# Patient Record
Sex: Male | Born: 1970 | Race: White | Hispanic: No | Marital: Married | Smoking: Never smoker
Health system: Southern US, Community
[De-identification: ages and names within clinical notes are randomized; demographics above are authoritative.]

## PROBLEM LIST (undated history)

## (undated) DIAGNOSIS — I509 Heart failure, unspecified: Secondary | ICD-10-CM

## (undated) DIAGNOSIS — B353 Tinea pedis: Secondary | ICD-10-CM

## (undated) DIAGNOSIS — I428 Other cardiomyopathies: Secondary | ICD-10-CM

## (undated) DIAGNOSIS — I10 Essential (primary) hypertension: Secondary | ICD-10-CM

## (undated) HISTORY — DX: Heart failure, unspecified: I50.9

## (undated) HISTORY — DX: Tinea pedis: B35.3

## (undated) HISTORY — DX: Essential (primary) hypertension: I10

---

## 2004-04-18 ENCOUNTER — Ambulatory Visit: Payer: Self-pay | Admitting: Family Medicine

## 2006-02-05 ENCOUNTER — Ambulatory Visit: Payer: Self-pay | Admitting: Family Medicine

## 2006-02-22 ENCOUNTER — Ambulatory Visit: Payer: Self-pay | Admitting: Pulmonary Disease

## 2006-07-30 ENCOUNTER — Ambulatory Visit: Payer: Self-pay | Admitting: Family Medicine

## 2006-07-30 ENCOUNTER — Encounter: Payer: Self-pay | Admitting: Family Medicine

## 2006-07-30 DIAGNOSIS — G4733 Obstructive sleep apnea (adult) (pediatric): Secondary | ICD-10-CM | POA: Insufficient documentation

## 2007-03-15 ENCOUNTER — Emergency Department (HOSPITAL_COMMUNITY): Admission: EM | Admit: 2007-03-15 | Discharge: 2007-03-15 | Payer: Self-pay | Admitting: Family Medicine

## 2008-09-07 ENCOUNTER — Telehealth: Payer: Self-pay | Admitting: Family Medicine

## 2008-09-08 ENCOUNTER — Ambulatory Visit: Payer: Self-pay | Admitting: Family Medicine

## 2008-09-28 ENCOUNTER — Telehealth: Payer: Self-pay | Admitting: Family Medicine

## 2008-10-02 ENCOUNTER — Ambulatory Visit: Payer: Self-pay | Admitting: Family Medicine

## 2008-10-06 LAB — CONVERTED CEMR LAB
ALT: 38 units/L (ref 0–53)
AST: 25 units/L (ref 0–37)
Alkaline Phosphatase: 71 units/L (ref 39–117)
Chloride: 107 meq/L (ref 96–112)
Direct LDL: 58.2 mg/dL
Eosinophils Relative: 2.1 % (ref 0.0–5.0)
HCT: 45.8 % (ref 39.0–52.0)
Hemoglobin: 16.2 g/dL (ref 13.0–17.0)
Lymphs Abs: 2.6 10*3/uL (ref 0.7–4.0)
Monocytes Relative: 5.8 % (ref 3.0–12.0)
Neutro Abs: 4.7 10*3/uL (ref 1.4–7.7)
Phosphorus: 3 mg/dL (ref 2.3–4.6)
Potassium: 3.9 meq/L (ref 3.5–5.1)
RBC: 5.07 M/uL (ref 4.22–5.81)
Sodium: 140 meq/L (ref 135–145)
TSH: 2.17 microintl units/mL (ref 0.35–5.50)
Total Bilirubin: 0.9 mg/dL (ref 0.3–1.2)
Total CHOL/HDL Ratio: 7
VLDL: 99.4 mg/dL — ABNORMAL HIGH (ref 0.0–40.0)
WBC: 8 10*3/uL (ref 4.5–10.5)

## 2009-04-06 ENCOUNTER — Ambulatory Visit: Payer: Self-pay | Admitting: Family Medicine

## 2009-04-06 DIAGNOSIS — E785 Hyperlipidemia, unspecified: Secondary | ICD-10-CM | POA: Insufficient documentation

## 2009-04-08 LAB — CONVERTED CEMR LAB
ALT: 44 units/L (ref 0–53)
AST: 31 units/L (ref 0–37)
Direct LDL: 94 mg/dL
VLDL: 53.4 mg/dL — ABNORMAL HIGH (ref 0.0–40.0)

## 2009-09-27 ENCOUNTER — Ambulatory Visit: Payer: Self-pay | Admitting: Family Medicine

## 2009-09-27 DIAGNOSIS — B353 Tinea pedis: Secondary | ICD-10-CM | POA: Insufficient documentation

## 2010-04-19 NOTE — Assessment & Plan Note (Signed)
Summary: 6 M F/U DLO   Vital Signs:  Patient profile:   40 year old male Weight:      230 pounds Temp:     97.7 degrees F oral Pulse rate:   80 / minute Pulse rhythm:   regular BP sitting:   122 / 82  (left arm) Cuff size:   large  Vitals Entered By: Lowella Petties CMA (April 06, 2009 9:17 AM) CC: 6 month follow up   History of Present Illness: here for f/u of elevated bp and cholesterol is feeling good overall   is working on lifestyle changes    last visit bp down with good habits  today is good as well 122/82  cholesterol high when we checked it this summer- trig trig 497, HDL 24 and LDL 58 he did not get phone message about this -- got it late  diet is much better -- with less fat  less drinks and alcohol  is taking fish oil -- 4 pills per day - omega 3 and 6 , and treadmill 2-3 miles per day also has a very physical job , runs and walk   is eating very little to no fried food  does not eat beef or egg yolks as a role  has cut back on breakfast meats   wt is up 2 lb     Allergies: No Known Drug Allergies  Past History:  Past Medical History: Last updated: 10/02/2008 elevated blood pressure   Family History: Last updated: 10/02/2008 Father:  Mother: HTN , mild DM Siblings: 2 brothers GM CVA, HTN, DM  Social History: Last updated: 10/02/2008 Marital Status: Married Children: 0 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 Eyes:  Denies blurring and eye pain. CV:  Denies chest pain or discomfort, lightheadness, palpitations, and shortness of breath with exertion. Resp:  Denies cough and wheezing. GI:  Denies abdominal pain, change in bowel habits, and  indigestion. GU:  Denies urinary frequency. MS:  Denies joint pain and muscle aches. Derm:  Denies lesion(s), poor wound healing, and rash. Neuro:  Denies headaches, numbness, and tingling. Psych:  Denies anxiety and depression. Endo:  Denies cold intolerance, excessive thirst, excessive urination, and heat intolerance.  Physical Exam  General:  overweight but generally well appearing  Head:  normocephalic, atraumatic, and no abnormalities observed.   Eyes:  vision grossly intact, pupils equal, pupils round, and pupils reactive to light.   Neck:  supple with full rom and no masses or thyromegally, no JVD or carotid bruit  Lungs:  normal respiratory effort, no intercostal retractions, no accessory muscle use, and normal breath sounds.   Heart:  Normal rate and regular rhythm. S1 and S2 normal without gallop, murmur, click, rub or other extra sounds. Msk:  No deformity or scoliosis noted of thoracic or lumbar spine.  no acute joint changes  Extremities:  No clubbing, cyanosis, edema, or deformity noted with normal full range of motion of all joints.   Neurologic:  sensation intact to light touch, gait normal, and DTRs symmetrical and normal.   Skin:  Intact without suspicious lesions or rashes Cervical Nodes:  No lymphadenopathy noted Psych:  normal affect, talkative and pleasant    Impression & Recommendations:  Problem # 1:  ELEVATED BLOOD PRESSURE WITHOUT DIAGNOSIS OF HYPERTENSION (ICD-796.2) Assessment Improved  now normal with healthy diet and exercise  will continue to follow  commended on good job  BP today: 122/82 Prior BP: 136/80 (10/02/2008)  Labs Reviewed: Creat: 1.0 (10/02/2008) Chol: 162 (10/02/2008)   HDL: 24.30 (10/02/2008)   TG: 497.0 (10/02/2008)  Instructed in low sodium diet (DASH Handout) and behavior modification.    Problem # 2:  HYPERLIPIDEMIA (ICD-272.4) Assessment: New  disc risks of high trig and low hdl  commended on good diet and exercise enc  to continue omega 3 rev low sat fat diet  lab today and adv Orders: Venipuncture (09811) TLB-Lipid Panel (80061-LIPID) TLB-ALT (SGPT) (84460-ALT) TLB-AST (SGOT) (84450-SGOT)  Labs Reviewed: SGOT: 25 (10/02/2008)   SGPT: 38 (10/02/2008)   HDL:24.30 (10/02/2008)  Chol:162 (10/02/2008)  Trig:497.0 (10/02/2008)  Complete Medication List: 1)  Elocon 0.1 % Lotn (Mometasone furoate) .... Use sparringly to rash two times a day as needed 2)  Fish Oil  .... 2 pills two times a day  Patient Instructions: 1)  you can raise your HDL (good cholesterol) by increasing exercise and eating omega 3 fatty acid supplement like fish oil or flax seed oil over the counter 2)  you can lower LDL (bad cholesterol) by limiting saturated fats in diet like red meat, fried foods, egg yolks, fatty breakfast meats, high fat dairy products and shellfish 3)  keep up better diet and exercise 4)  will adv you when labs return   Prior Medications (reviewed today): ELOCON 0.1 % LOTN (MOMETASONE FUROATE) use sparringly to rash two times a day as needed Current Allergies: No known allergies

## 2010-04-19 NOTE — Assessment & Plan Note (Signed)
Summary: ATHLETES FEET / LFW   Vital Signs:  Patient profile:   40 year old male Height:      69.75 inches Weight:      226.50 pounds BMI:     32.85 Temp:     98.5 degrees F oral Pulse rate:   76 / minute Pulse rhythm:   regular BP sitting:   128 / 92  (left arm) Cuff size:   large  Vitals Entered By: Lewanda Rife LPN (September 27, 2009 4:27 PM) CC: Athletes foot on left foot   History of Present Illness: has athletes foot - hurts and itches - tries not to scratch peels and dry  has had it 3-4 months  ? 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 Eyes:  Denies blurring and discharge. CV:  Denies chest pain or discomfort. Resp:  Denies cough and wheezing. MS:  Denies joint pain. Derm:  Complains of itching, poor wound healing, and rash. Neuro:  Denies numbness and tingling. Endo:  Denies excessive thirst and excessive urination. Heme:  Denies abnormal bruising and bleeding.  Physical Exam  General:  overweight but generally well appearing  Head:  normocephalic, atraumatic, and no abnormalities observed.     Eyes:  vision grossly intact, pupils equal, pupils round, and pupils reactive to light.  no conjunctival pallor, injection or icterus  Mouth:  pharynx pink and moist.   Neck:  supple with full rom and no masses or thyromegally, no JVD or carotid bruit  Chest Wall:  No deformities, masses, tenderness or gynecomastia noted. Lungs:  normal respiratory effort, no intercostal retractions, no accessory muscle use, and normal breath sounds.   Heart:  Normal rate and regular rhythm. S1 and S2 normal without gallop, murmur, click, rub or other extra sounds. Skin:  L foot- redness/ scale/peeling of 2nd and 3rd toes with maceration in between  some excoriation no vesicles  no nail involvement Psych:  normal affect, talkative and pleasant    Impression & Recommendations:  Problem # 1:  DERMATOPHYTOSIS OF FOOT (ICD-110.4) Assessment New  L foot - with peeling and cracking and discomfort disc disinfecting shower/ shoes  trial of naftin once daily cream (will treat both feet) if not imp - will update / may need to investigate poss of eczema type rxn The following medications were removed from the medication list:    Lamisil At Athletes Foot 1 % Crea (Terbinafine hcl) ..... Otc as directed.    Micatin 2 % Aero (Miconazole nitrate) ..... Otc as directed. His updated medication list for this problem includes:    Tinactin 1 % Aero (Tolnaftate) .Marland KitchenMarland KitchenMarland KitchenMarland Kitchen  Otc as directed.    Naftin 1 % Crea (Naftifine hcl) .Marland Kitchen... Apply to both feet once daily for 4 weeks  Orders: Prescription Created Electronically [phone])  Complete Medication List: 1)  Elocon 0.1 % Lotn (Mometasone furoate) .... Use sparringly to rash two times a day as needed 2)  Fish Oil  .... 2 pills two times a day 3)  Tinactin 1 % Aero (Tolnaftate) .... Otc as directed. 4)  Naftin 1 % Crea (Naftifine hcl) .... Apply to both feet once daily for 4 weeks  Patient Instructions: 1)  use lysol on shower surfaces  2)  use antifungal foot spray over  the counter in shoes - like tinactin every day that you wear them  3)  keep feet as clean and dry as possible- even use a hairdryer on cool setting to dry after bathing  4)  use the naftin cream as directed  5)  if not improved in 2 weeks - please call and let me know  Prescriptions: NAFTIN 1 % CREA (NAFTIFINE HCL) apply to both feet once daily for 4 weeks  #1 medium x 1   Entered and Authorized by:   Judith Part MD   Signed by:   Judith Part MD on 09/27/2009   Method used:   Electronically to        [facility] (retail)       [address]       [place], [place]  [address]       Ph: [phone] or [phone]       Fax: [phone]   RxID:   [id number]   Current Allergies (reviewed today): No known allergies

## 2010-05-17 ENCOUNTER — Encounter: Payer: Self-pay | Admitting: Family Medicine

## 2010-05-24 ENCOUNTER — Encounter: Payer: Self-pay | Admitting: Family Medicine

## 2010-06-10 ENCOUNTER — Ambulatory Visit (INDEPENDENT_AMBULATORY_CARE_PROVIDER_SITE_OTHER): Payer: BC Managed Care – PPO | Admitting: Family Medicine

## 2010-06-10 ENCOUNTER — Encounter: Payer: Self-pay | Admitting: Family Medicine

## 2010-06-10 DIAGNOSIS — Z0000 Encounter for general adult medical examination without abnormal findings: Secondary | ICD-10-CM

## 2010-06-10 DIAGNOSIS — B353 Tinea pedis: Secondary | ICD-10-CM

## 2010-06-10 DIAGNOSIS — E785 Hyperlipidemia, unspecified: Secondary | ICD-10-CM

## 2010-06-10 LAB — HEPATIC FUNCTION PANEL
ALT: 31 U/L (ref 0–53)
Bilirubin, Direct: 0.1 mg/dL (ref 0.0–0.3)
Indirect Bilirubin: 0.7 mg/dL (ref 0.0–0.9)
Total Bilirubin: 0.8 mg/dL (ref 0.3–1.2)

## 2010-06-10 LAB — LIPID PANEL
Cholesterol: 158 mg/dL (ref 0–200)
HDL: 24 mg/dL — ABNORMAL LOW (ref >39)
LDL Cholesterol: 75 mg/dL (ref 0–99)
Total CHOL/HDL Ratio: 6.6 Ratio
Triglycerides: 293 mg/dL — ABNORMAL HIGH (ref <150)
VLDL: 59 mg/dL — ABNORMAL HIGH (ref 0–40)

## 2010-06-10 LAB — BASIC METABOLIC PANEL
Chloride: 104 mEq/L (ref 96–112)
Creat: 1.03 mg/dL (ref 0.40–1.50)

## 2010-06-10 NOTE — Assessment & Plan Note (Signed)
In the past high trig  Pt has been working on diet Lab today - non fasting in light of schedule - but if high - will repeat fasting Reviewed low sat fat diet in detail today

## 2010-06-10 NOTE — Assessment & Plan Note (Signed)
Rev medical/ fam and soc hx - risk factors Diet and exercise habits discussed as well as low sat fat diet  Disc skin cancer prev Wellness labs today imms up to date

## 2010-06-10 NOTE — Patient Instructions (Signed)
Keep working on [employer] and exercise  Use sunscreen regularly  Labs today for wellness and cholesterol

## 2010-06-10 NOTE — Progress Notes (Signed)
Subjective:    Patient ID: [name], male    DOB: 1971-02-07, 40 y.o.   MRN: [id number]  HPI Here for health mt exam and to review chronic med problems   Is doing well  Is feeling good  No medical problems   Very busy with work -- Oceanographer   Hx of lipids with high trig in past - due for a check Last LDL 94 which is good   Wt is up 3 lb bmi is 33 He is trying to take care of himself  Uses the treadmill 4 times per week -- 2.25 mild and runs for 1/2 of that  occ weight training  Very active job   Diet is so/so  Eats healthy for the most part -- mostly salads  Has cut back on fast food  Cooks at home a lot more  Gave up soft drinks Drinks a lot of water  1 cup of coffee with skim milk  And no sugar   Non smoker No chewing tab   Td 7/10   Does have HTN and DM in family   bp is good today   Treats foot fungus just as needed  Keeps feet dry    Past Medical History  Diagnosis Date  . Hypertension   . Athlete's foot    No past surgical history on file.   History   Social History  . Marital Status: Married    Spouse Name: N/A    Number of Children: 0  . Years of Education: N/A   Occupational History  . Telephone    Social History Main Topics  . Smoking status: Never Smoker   . Smokeless tobacco: Not on file  . Alcohol Use: Yes  . Drug Use:   . Sexually Active:    Other Topics Concern  . Not on file   Social History Narrative  . No narrative on file   family history includes Diabetes in his maternal grandmother and mother; Heart disease in his maternal grandmother; and Hypertension in his maternal grandmother and mother.      Review of Systems  Constitutional: Negative for appetite change and fatigue.  HENT: Negative for congestion.   Eyes: Negative for visual disturbance.  Respiratory: Negative.   Cardiovascular: Negative.   Gastrointestinal: Negative for abdominal pain, diarrhea and constipation.  Genitourinary:  Negative for frequency.  Musculoskeletal: Negative for arthralgias.  Skin: Positive for rash. Negative for wound.  Neurological: Negative for numbness and headaches.  Hematological: Does not bruise/bleed easily.  Psychiatric/Behavioral: Negative for dysphoric mood. The patient is not nervous/anxious.        Objective:   Physical Exam  Constitutional: He appears well-developed and well-nourished. No distress.       overwt and well appearing   HENT:  Head: Normocephalic and atraumatic.  Right Ear: External ear normal.  Left Ear: External ear normal.  Nose: Nose normal.  Eyes: Conjunctivae and EOM are normal. Pupils are equal, round, and reactive to light.  Neck: Normal range of motion. Neck supple. No JVD present. Carotid bruit is not present. No thyromegaly present.  Cardiovascular: Normal rate, regular rhythm and normal heart sounds.   Pulmonary/Chest: Effort normal and breath sounds normal.  Abdominal: Soft. He exhibits no mass. There is no tenderness.  Musculoskeletal: Normal range of motion. He exhibits no edema.  Lymphadenopathy:    He has no cervical adenopathy.  Neurological: He has normal reflexes. Coordination normal.  Skin: Skin is warm and dry.  Some redness in between 2nd and 3rd toes on L foot - no skin breakdown  Scant scaling on heels  Psychiatric: He has a normal mood and affect.          Assessment & Plan:

## 2010-06-10 NOTE — Assessment & Plan Note (Signed)
Overall improved  Mostly in between L 2nd and 3rd toes Keeps dry- meds prn

## 2010-06-11 LAB — CBC WITH DIFFERENTIAL/PLATELET
Basophils Absolute: 0 10*3/uL (ref 0.0–0.1)
Basophils Relative: 0 % (ref 0–1)
Eosinophils Absolute: 0.1 10*3/uL (ref 0.0–0.7)
Hemoglobin: 15.1 g/dL (ref 13.0–17.0)
MCH: 30.4 pg (ref 26.0–34.0)
MCHC: 34.5 g/dL (ref 30.0–36.0)
Neutro Abs: 5.2 10*3/uL (ref 1.7–7.7)
Neutrophils Relative %: 59 % (ref 43–77)
Platelets: 238 10*3/uL (ref 150–400)
RDW: 12.2 % (ref 11.5–15.5)

## 2010-06-11 LAB — TSH: TSH: 3.621 u[IU]/mL (ref 0.350–4.500)

## 2010-06-13 NOTE — Progress Notes (Signed)
Patient notified as instructed by telephone. Pt will call back to scheduled fasting lab appt.

## 2010-08-05 NOTE — Assessment & Plan Note (Signed)
[facility]                             PULMONARY OFFICE NOTE   [name], [name]                     MRN:          [id number]  DATE:02/22/2006                            DOB:          [birth year]    I had the pleasure of meeting [name] today for evaluation of sleep  difficulties.   He says that his wife has told him that he snores particularly when he  is on his back and has had a couple of instances in which he has woken  up with a gasping feeling. He will also tend to breathe through his  mouth at night. He usually goes to sleep between 9 and midnight, but it  is usually closer to midnight. He has no difficulty falling asleep. He  wakes up perhaps once a night to use the bathroom between 3 and 5  o'clock in the morning and then is able to go back to sleep and he will  wake up at 7 in the morning to get ready to go to the work. He says that  he sleeps in until 8 or 9 o'clock in the morning on weekends however. He  says that sometimes it can be a little bit of a struggle for him when he  is getting up in the morning, but he denies having any headaches in the  morning. He will drink 1 cup of coffee and 2 to 3 sodas a day. He says  that he has no trouble as far as staying awake while he is driving, but  if he is at home in the evening watching TV, he will tend to dose off.  He denies any history of sleep walking or sleep talking, nightmares or  night terrors. There is no history of restless leg syndrome. He denies  any history of sleep hallucinations, sleep paralysis or cataplexy. He is  not currently using anything to help him fall asleep at night or stay  awake during the day otherwise.   PAST MEDICAL HISTORY:  Is significant for seasonal allergies.   He is not currently on any medications.   There are no known drug allergies.   SOCIAL HISTORY:  He is married. He has no children. 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 He denies any cigarette use. He does use smokeless  tobacco. He has occasional alcohol use.   FAMILY HISTORY:  Is significant for his grandfather with emphysema.   REVIEW OF SYSTEMS:  He complains of nasal congestion and rhinitis with  the change of the seasons.   PHYSICAL EXAMINATION:  He is 6 feet tall and 226 pounds, temperature is  98.6, blood pressure is 112/80, heart rate is 102. Oxygen saturation is  97% on room air.  HEENT: Pupils are reactive. Extraocular muscles are intact. There is no  sinus tenderness. There is no nasal discharge. He has a Mallampati 3  airway with scalloped borders of his tongue and 3+ tonsils with an  elongated uvula. There was no lymphadenopathy. No thyromegaly.  HEART: Was S1, S2, regular rhythm.  CHEST: Was clear to auscultation.  ABDOMEN: Obese, soft and nontender.  EXTREMITIES: There was no edema, cyanosis or clubbing.  NEUROLOGIC: No focal deficits were appreciated.   His Epworth score was 3/24.   IMPRESSION:  He certainly has symptoms as well as physical finding which  would be suggestive of possibly having sleep disorder breathing. I think  that to further evaluate this he will need to undergo an overnight  polysomnogram and if in fact he does have sleep apnea, then further  interventions would be discussed at that time. In the meantime, I have  described to him the diagnosis of sleep apnea including the adverse  health consequences related to sleep apnea. These include increased risk  of hypertension, coronary artery disease, cerebral vascular disease and  diabetes. I also discussed with him the importance of diet, exercise and  weight reduction as well as the avoidance of alcohol and sedatives.  Driving precautions were discussed with him as well.   I will follow up with him in approximately 3 to 4 weeks to review the  results of his sleep study.     Troy Helling, MD   Electronically Signed    VS/MedQ  DD: 02/22/2006  DT: 02/22/2006  Job #: [id number]   cc:   Troy A. Milinda Antis, MD

## 2010-08-05 NOTE — Assessment & Plan Note (Signed)
[facility]                             PULMONARY OFFICE NOTE   NAME:[name], [name]                     MRN:          [id number]  DATE:03/30/2006                            DOB:          Jul 15, 1970    Mr. [name] was scheduled to have his overnight polysomnogram done  March 23, 2006.  Unfortunately, he was out of town during that time  period and had to cancel his appointment.  I have discussed this with  Mr. [name], and he has informed me that he would need to consult his  schedule further before rescheduling for his overnight polysomnogram,  but that he would call the Sleep Lab to reschedule this when it is  convenient for him.  Therefore, I will defer followup with him until he  has been able to schedule his overnight polysomnogram.     Coralyn Helling, MD  Electronically Signed    VS/MedQ  DD: 03/30/2006  DT: 03/30/2006  Job #: [id number]   cc:   Marne A. Milinda Antis, MD

## 2010-09-27 ENCOUNTER — Telehealth: Payer: Self-pay | Admitting: *Deleted

## 2010-09-27 MED ORDER — TERBINAFINE HCL 250 MG PO TABS: 250.0000 mg | ORAL_TABLET | Freq: Every day | ORAL | Status: AC

## 2010-09-27 NOTE — Telephone Encounter (Signed)
Patients wife advised and he will start medication when home from beach to avoid alcohol. Patient will also schedule follow up when medication finished

## 2010-09-27 NOTE — Telephone Encounter (Signed)
We can try lamasil 250mg  daily for 2 weeks  Then follow up for a re check  This is unsafe with excessive alcohol or liver problems (I looked at his last liver tests- these were fine) Px sent electronically  sched f/u for about 2-3 weeks when done with med -thanks

## 2010-09-27 NOTE — Telephone Encounter (Signed)
Pt is having a lot of trouble with athlete's foot, having to wear work boots in this hot weather.  He has tried several otc meds and is finishing up with naftin, which his dermatologist recommended.  Nothing helps and his wife is asking if there is an oral medicine that he can try.  [facility] [address]

## 2013-05-23 ENCOUNTER — Encounter: Payer: Self-pay | Admitting: Family Medicine

## 2013-05-23 ENCOUNTER — Ambulatory Visit: Payer: BC Managed Care – PPO | Admitting: Family Medicine

## 2013-05-23 VITALS — BP 108/70 | HR 119 | Temp 97.5°F | Ht 69.75 in | Wt 229.5 lb

## 2013-05-23 DIAGNOSIS — F411 Generalized anxiety disorder: Secondary | ICD-10-CM

## 2013-05-23 DIAGNOSIS — R0789 Other chest pain: Secondary | ICD-10-CM | POA: Insufficient documentation

## 2013-05-23 DIAGNOSIS — R5383 Other fatigue: Secondary | ICD-10-CM | POA: Insufficient documentation

## 2013-05-23 DIAGNOSIS — K219 Gastro-esophageal reflux disease without esophagitis: Secondary | ICD-10-CM

## 2013-05-23 DIAGNOSIS — G4730 Sleep apnea, unspecified: Secondary | ICD-10-CM

## 2013-05-23 MED ORDER — OMEPRAZOLE 20 MG PO CPDR: 20.0000 mg | DELAYED_RELEASE_CAPSULE | Freq: Every day | ORAL | Status: DC

## 2013-05-23 NOTE — Progress Notes (Signed)
Pre visit review using our clinic review tool, if applicable. No additional management support is needed unless otherwise documented below in the visit note. 

## 2013-05-23 NOTE — Progress Notes (Signed)
Subjective:    Patient ID: Troy NettersJonathan Marietta, male    DOB: [birth year], 43 y.o.   MRN: [id number]  HPI Here with ? Of poss sleep apnea   ? Anxiety also  Has his first child coming in July -and is admittedly nervous about it   Is fatigued  Is fatigued more easily than usual  Unable to sleep through the night  Sleeps short periods and wakes up gasping for breath  Snores lately - uses nasal strips  Per wife-night breathing is shallow  Discomfort and a bloated feeling in his upper abd and chest  Worse with exertion Feels sob briefly  If he stops what he is doing-it goes away  No sweats or nausea with this   No fam hx of heart problems except mother with HTN - causing new CHF   BP Readings from Last 3 Encounters:  05/23/13 108/70  06/10/10 126/84  09/27/09 128/92      More constipated than usual   If he drinks beer (which is infrequent) - he gets a bubble sensation in upper abd as well  Also gets heartburn  Patient Active Problem List   Diagnosis Date Noted  . Chest discomfort 05/23/2013  . Fatigue 05/23/2013  . Anxiety state, unspecified 05/23/2013  . Acid reflux 05/23/2013  . Health maintenance examination 06/10/2010  . DERMATOPHYTOSIS OF FOOT 09/27/2009  . HYPERLIPIDEMIA 04/06/2009  . ELEVATED BLOOD PRESSURE WITHOUT DIAGNOSIS OF HYPERTENSION 09/08/2008  . SLEEP APNEA 07/30/2006   Past Medical History  Diagnosis Date  . Hypertension   . Athlete's foot    No past surgical history on file. History  Substance Use Topics  . Smoking status: Never Smoker   . Smokeless tobacco: Not on file  . Alcohol Use: Yes     Comment: rare   Family History  Problem Relation Age of Onset  . Hypertension Mother   . Diabetes Mother   . Hypertension Maternal Grandmother   . Diabetes Maternal Grandmother   . Heart disease Maternal Grandmother    No Known Allergies Current Outpatient Prescriptions on File Prior to Visit  Medication Sig Dispense Refill  . fish oil-omega-3  fatty acids 1000 MG capsule Take 1 g by mouth 2 (two) times daily.        No current facility-administered medications on file prior to visit.    Review of Systems Review of Systems  Constitutional: Negative for fever, appetite change,  and unexpected weight change.  Eyes: Negative for pain and visual disturbance.  Respiratory: Negative for cough and shortness of breath.   Cardiovascular: Negative for palpitations  Neg for pedal edema   Gastrointestinal: Negative for nausea, diarrhea pos for heartburn and bloating Genitourinary: Negative for urgency and frequency.  Skin: Negative for pallor or rash   Neurological: Negative for weakness, light-headedness, numbness and headaches.  Hematological: Negative for adenopathy. Does not bruise/bleed easily.  Psychiatric/Behavioral: Negative for dysphoric mood. The patient is not nervous/anxious.         Objective:   Physical Exam  Constitutional: He appears well-developed and well-nourished. No distress.  obese and well appearing   HENT:  Head: Normocephalic and atraumatic.  Mouth/Throat: Oropharynx is clear and moist.  Eyes: Conjunctivae and EOM are normal. Pupils are equal, round, and reactive to light. Right eye exhibits no discharge. Left eye exhibits no discharge. No scleral icterus.  Neck: Normal range of motion. Neck supple. No JVD present. Carotid bruit is not present. No thyromegaly present.  Cardiovascular: Normal rate, regular rhythm, normal  heart sounds and intact distal pulses.  Exam reveals no gallop.   No murmur heard. Pulmonary/Chest: Effort normal and breath sounds normal. No respiratory distress. He has no wheezes. He has no rales.  Abdominal: Soft. Bowel sounds are normal. He exhibits no distension, no abdominal bruit and no mass. There is tenderness. There is no rebound and no guarding.  Mild epigastric tenderness without rebound or guarding   Musculoskeletal: He exhibits no edema.  Lymphadenopathy:    He has no cervical  adenopathy.  Neurological: He is alert. He has normal reflexes.  Skin: Skin is warm and dry. No rash noted. No erythema. No pallor.  Psychiatric: He has a normal mood and affect.          Assessment & Plan:

## 2013-05-23 NOTE — Patient Instructions (Signed)
Start omeprazole for acid reflux/stomach symptoms  Stop up front for referral to the sleep clinic to get back on track for evaluation and treatment of sleep apnea  Avoid caffeine and carbonation  Avoid spicy foods  If your chest discomfort does not improve with the omeprazole - let me know  Follow up with me in about 2-3 weeks

## 2013-05-25 NOTE — Assessment & Plan Note (Signed)
Ref to pulmonary sleep clinic for eval

## 2013-05-25 NOTE — Assessment & Plan Note (Signed)
susp for sleep apnea  Ref to pulm sleep clinic for eval

## 2013-05-25 NOTE — Assessment & Plan Note (Signed)
Start omeprazole  Disc gerd diet and need for wt loss  Update if not starting to improve in a week or if worsening

## 2013-05-25 NOTE — Assessment & Plan Note (Signed)
Sinus tach on EKG-pt was admittedly nervous Poor R wave progression - no hx of pulm dz but  ? Of poss sleep apnea  Atypical  If symptoms progress or worsen -will update and seek care

## 2013-05-25 NOTE — Assessment & Plan Note (Signed)
?   If contrib to his current symptoms - with new baby coming in the summer Disc relaxation tech/ exercise  Will f/u to disc further if no improvement

## 2013-05-28 ENCOUNTER — Telehealth: Payer: Self-pay

## 2013-05-28 NOTE — Telephone Encounter (Signed)
Try nexium over the counter one daily- let me know early next week if this helps at all

## 2013-05-28 NOTE — Telephone Encounter (Signed)
Left message for patient to try Nexium OTC one daily and to call us early next week to let Dr. Milinda Antis know if it helps at all.

## 2013-05-28 NOTE — Telephone Encounter (Signed)
Pt left v/m that omeprazole is not effective and pt request different med for acid reflux or what to do.Please advise.[facility]

## 2013-05-29 NOTE — Telephone Encounter (Addendum)
Pt left v/m; pt got message but pt said still very bloated and wants to know what to do to get rid of the bloated feeling.pt request cb.

## 2013-05-29 NOTE — Telephone Encounter (Signed)
He can try some gas-x over the counter for that also and see if it helps

## 2013-05-30 ENCOUNTER — Encounter: Payer: Self-pay | Admitting: Internal Medicine

## 2013-05-30 ENCOUNTER — Ambulatory Visit (HOSPITAL_COMMUNITY)
Admission: RE | Admit: 2013-05-30 | Discharge: 2013-05-30 | Disposition: A | Discharge status: Home / Self Care | Payer: BC Managed Care – PPO | Source: Ambulatory Visit | Attending: Internal Medicine | Admitting: Internal Medicine

## 2013-05-30 ENCOUNTER — Other Ambulatory Visit: Payer: Self-pay | Admitting: Internal Medicine

## 2013-05-30 ENCOUNTER — Ambulatory Visit (INDEPENDENT_AMBULATORY_CARE_PROVIDER_SITE_OTHER)
Admission: RE | Admit: 2013-05-30 | Discharge: 2013-05-30 | Disposition: A | Discharge status: Home / Self Care | Payer: BC Managed Care – PPO | Source: Ambulatory Visit | Attending: Internal Medicine | Admitting: Internal Medicine

## 2013-05-30 ENCOUNTER — Telehealth: Payer: Self-pay

## 2013-05-30 ENCOUNTER — Ambulatory Visit (INDEPENDENT_AMBULATORY_CARE_PROVIDER_SITE_OTHER): Payer: BC Managed Care – PPO | Admitting: Internal Medicine

## 2013-05-30 ENCOUNTER — Other Ambulatory Visit: Payer: BC Managed Care – PPO

## 2013-05-30 VITALS — BP 118/76 | HR 110 | Temp 98.1°F | Wt 230.0 lb

## 2013-05-30 DIAGNOSIS — R140 Abdominal distension (gaseous): Secondary | ICD-10-CM

## 2013-05-30 DIAGNOSIS — R142 Eructation: Secondary | ICD-10-CM

## 2013-05-30 DIAGNOSIS — R112 Nausea with vomiting, unspecified: Secondary | ICD-10-CM

## 2013-05-30 DIAGNOSIS — R188 Other ascites: Secondary | ICD-10-CM | POA: Insufficient documentation

## 2013-05-30 DIAGNOSIS — R109 Unspecified abdominal pain: Secondary | ICD-10-CM

## 2013-05-30 DIAGNOSIS — R1110 Vomiting, unspecified: Secondary | ICD-10-CM | POA: Insufficient documentation

## 2013-05-30 DIAGNOSIS — R143 Flatulence: Secondary | ICD-10-CM

## 2013-05-30 DIAGNOSIS — R17 Unspecified jaundice: Secondary | ICD-10-CM

## 2013-05-30 DIAGNOSIS — R748 Abnormal levels of other serum enzymes: Secondary | ICD-10-CM

## 2013-05-30 DIAGNOSIS — R160 Hepatomegaly, not elsewhere classified: Secondary | ICD-10-CM | POA: Insufficient documentation

## 2013-05-30 DIAGNOSIS — R141 Gas pain: Secondary | ICD-10-CM

## 2013-05-30 DIAGNOSIS — J90 Pleural effusion, not elsewhere classified: Secondary | ICD-10-CM | POA: Insufficient documentation

## 2013-05-30 LAB — COMPREHENSIVE METABOLIC PANEL
ALBUMIN: 3.6 g/dL (ref 3.5–5.2)
ALK PHOS: 67 U/L (ref 39–117)
ALT: 157 U/L — AB (ref 0–53)
AST: 98 U/L — AB (ref 0–37)
BILIRUBIN TOTAL: 2.1 mg/dL — AB (ref 0.3–1.2)
BUN: 18 mg/dL (ref 6–23)
CO2: 24 mEq/L (ref 19–32)
Calcium: 8.9 mg/dL (ref 8.4–10.5)
Chloride: 101 mEq/L (ref 96–112)
Creatinine, Ser: 1.5 mg/dL (ref 0.4–1.5)
GFR: 53.59 mL/min — ABNORMAL LOW (ref >60.00)
GLUCOSE: 110 mg/dL — AB (ref 70–99)
POTASSIUM: 4.6 meq/L (ref 3.5–5.1)
SODIUM: 134 meq/L — AB (ref 135–145)
TOTAL PROTEIN: 5.9 g/dL — AB (ref 6.0–8.3)

## 2013-05-30 LAB — CBC
HEMATOCRIT: 47.6 % (ref 39.0–52.0)
Hemoglobin: 15.6 g/dL (ref 13.0–17.0)
MCHC: 32.8 g/dL (ref 30.0–36.0)
MCV: 91.8 fl (ref 78.0–100.0)
Platelets: 248 10*3/uL (ref 150.0–400.0)
RBC: 5.18 Mil/uL (ref 4.22–5.81)
RDW: 14 % (ref 11.5–14.6)
WBC: 11.8 10*3/uL — ABNORMAL HIGH (ref 4.5–10.5)

## 2013-05-30 LAB — H. PYLORI ANTIBODY, IGG: H PYLORI IGG: NEGATIVE

## 2013-05-30 LAB — LIPASE: Lipase: 44 U/L (ref 11.0–59.0)

## 2013-05-30 MED ORDER — ONDANSETRON HCL 4 MG PO TABS: 4.0000 mg | ORAL_TABLET | Freq: Three times a day (TID) | ORAL | Status: DC | PRN

## 2013-05-30 NOTE — Telephone Encounter (Signed)
That sounds good

## 2013-05-30 NOTE — Telephone Encounter (Signed)
I am not really concerned about the swelling in his ankles at this time. We need to figure out what is going on with his liver/gallbladder first

## 2013-05-30 NOTE — Patient Instructions (Addendum)
Bloating  Bloating is the feeling of fullness in your belly. You may feel as though your pants are too tight. Often the cause of bloating is overeating, retaining fluids, or having gas in your bowel. It is also caused by swallowing air and eating foods that cause gas. Irritable bowel syndrome is one of the most common causes of bloating. Constipation is also a common cause. Sometimes more serious problems can cause bloating.  SYMPTOMS   Usually there is a feeling of fullness, as though your abdomen is bulged out. There may be mild discomfort.   DIAGNOSIS   Usually no particular testing is necessary for most bloating. If the condition persists and seems to become worse, your caregiver may do additional testing.   TREATMENT   · There is no direct treatment for bloating.  · Do not put gas into the bowel. Avoid chewing gum and sucking on candy. These tend to make you swallow air. Swallowing air can also be a nervous habit. Try to avoid this.  · Avoiding high residue diets will help. Eat foods with soluble fibers (examples include root vegetables, apples, or barley) and substitute dairy products with soy and rice products. This helps irritable bowel syndrome.  · If constipation is the cause, then a high residue diet with more fiber will help.  · Avoid carbonated beverages.  · Over-the-counter preparations are available that help reduce gas. Your pharmacist can help you with this.  SEEK MEDICAL CARE IF:   · Bloating continues and seems to be getting worse.  · You notice a weight gain.  · You have a weight loss but the bloating is getting worse.  · You have changes in your bowel habits or develop nausea or vomiting.  SEEK IMMEDIATE MEDICAL CARE IF:   · You develop shortness of breath or swelling in your legs.  · You have an increase in abdominal pain or develop chest pain.  Document Released: 01/04/2006 Document Revised: 05/29/2011 Document Reviewed: 02/22/2007  Exit[employer]® Patient Information ©2014 [employer].

## 2013-05-30 NOTE — Telephone Encounter (Signed)
Before pt left after his labs were drawn, he mentioned to [facility] that you were saying that you were going to send something in for his edema--please advise

## 2013-05-30 NOTE — Telephone Encounter (Signed)
Pt advise to use gas-x but he has an appt with Nicki Reaper, NP today to address his stomach issues

## 2013-05-30 NOTE — Progress Notes (Signed)
Pre visit review using our clinic review tool, if applicable. No additional management support is needed unless otherwise documented below in the visit note. 

## 2013-05-30 NOTE — Progress Notes (Signed)
Subjective:    Patient ID: [name], male    DOB: 1970/10/27, 43 y.o.   MRN: [id number]  HPI  Pt presents to the clinic today with c/o abdominal pain, bloating and vomiting. This started 2-3 weeks ago. He is unable to keep anything down. He also c/o shortness of breath at night and shallow breathing during the day. He does have constipation/diarrhea. He has tried Pepto, Zantac and Imodium OTC. He was seen 3/6 for similar issues. He was started on prilosec at that time for probable GERD. He has not felt like the prilosec has helped. He is scheduled for a sleep study in the next 2 weeks. Additionally, he c/o bilateral ankle swelling. This started this morning. He has not ever had swelling in the feet before. His feet also feel cold to him.  Review of Systems      Past Medical History  Diagnosis Date  . Hypertension   . Athlete's foot     Current Outpatient Prescriptions  Medication Sig Dispense Refill  . fish oil-omega-3 fatty acids 1000 MG capsule Take 1 g by mouth 2 (two) times daily.       Marland Kitchen omeprazole (PRILOSEC) 20 MG capsule Take 20 mg by mouth 2 (two) times daily before a meal.       No current facility-administered medications for this visit.    No Known Allergies  Family History  Problem Relation Age of Onset  . Hypertension Mother   . Diabetes Mother   . Hypertension Maternal Grandmother   . Diabetes Maternal Grandmother   . Heart disease Maternal Grandmother     History   Social History  . Marital Status: Married    Spouse Name: N/A    Number of Children: 0  . Years of Education: N/A   Occupational History  . Telephone    Social History Main Topics  . Smoking status: Never Smoker   . Smokeless tobacco: Not on file  . Alcohol Use: Yes     Comment: rare  . Drug Use: Not on file  . Sexual Activity: Not on file   Other Topics Concern  . Not on file   Social History Narrative  . No narrative on file     Constitutional: Denies fever,  malaise, fatigue, headache or abrupt weight changes.  HEENT: Denies eye pain, eye redness, ear pain, ringing in the ears, wax buildup, runny nose, nasal congestion, bloody nose, or sore throat. Respiratory: Pt reports shortness of breath. Denies difficulty breathing, cough or sputum production.   Cardiovascular: Pt reports swelling of the bilateral ankles. Denies chest pain, chest tightness, palpitations or swelling in the hands.  Gastrointestinal: Pt reports abdominal cramping, bloating and vomiting.  Denies constipation, diarrhea or blood in the stool.    No other specific complaints in a complete review of systems (except as listed in HPI above).  Objective:   Physical Exam   BP 118/76  Pulse 110  Temp(Src) 98.1 F (36.7 C) (Oral)  Wt 230 lb (104.327 kg)  SpO2 98% Wt Readings from Last 3 Encounters:  05/30/13 230 lb (104.327 kg)  05/23/13 229 lb 8 oz (104.101 kg)  06/10/10 229 lb 12 oz (104.214 kg)    General: Appears his stated age, obese but well developed, well nourished in NAD. Cardiovascular: Normal rate and rhythm. S1,S2 noted.  No murmur, rubs or gallops noted. No JVD, 2+ non pitting edema of bilateral edema. No carotid bruits noted. Pulmonary/Chest: Normal effort and positive vesicular breath sounds. No  respiratory distress. No wheezes, rales or ronchi noted.  Abdomen: Soft and tender in the epigastric region. Hypoactive bowel sounds, no bruits noted. No distention or masses noted. Liver, spleen and kidneys non palpable.   BMET    Component Value Date/Time   NA 140 06/10/2010 1657   K 3.8 06/10/2010 1657   CL 104 06/10/2010 1657   CO2 25 06/10/2010 1657   GLUCOSE 79 06/10/2010 1657   BUN 12 06/10/2010 1657   CREATININE 1.03 06/10/2010 1657   CREATININE 1.0 10/02/2008 0933   CALCIUM 9.6 06/10/2010 1657    Lipid Panel     Component Value Date/Time   CHOL 158 06/10/2010 1657   TRIG 293* 06/10/2010 1657   HDL 24* 06/10/2010 1657   CHOLHDL 6.6 06/10/2010 1657   VLDL 59*  06/10/2010 1657   LDLCALC 75 06/10/2010 1657    CBC    Component Value Date/Time   WBC 8.8 06/10/2010 1657   RBC 4.97 06/10/2010 1657   HGB 15.1 06/10/2010 1657   HCT 43.8 06/10/2010 1657   PLT 238 06/10/2010 1657   MCV 88.1 06/10/2010 1657   MCH 30.4 06/10/2010 1657   MCHC 34.5 06/10/2010 1657   RDW 12.2 06/10/2010 1657   LYMPHSABS 3.0 06/10/2010 1657   MONOABS 0.5 06/10/2010 1657   EOSABS 0.1 06/10/2010 1657   BASOSABS 0.0 06/10/2010 1657    Hgb A1C No results found for this basename: HGBA1C        Assessment & Plan:   Abdominal cramping, bloating and emesis:  ? Hiatal hernia Will check CBC, CMET, H pylori and lipase eRx for zofran as needed for nausea/vomiting KUB of abdomen today Continue prilosec for now  RTC as needed or if symptoms persist, ER if worse

## 2013-05-31 ENCOUNTER — Encounter (HOSPITAL_COMMUNITY): Payer: Self-pay | Admitting: Emergency Medicine

## 2013-05-31 ENCOUNTER — Inpatient Hospital Stay (HOSPITAL_COMMUNITY)
Admission: EM | Admit: 2013-05-31 | Discharge: 2013-06-09 | DRG: 286 | Disposition: A | Discharge status: Home / Self Care | Payer: BC Managed Care – PPO | Attending: Cardiology | Admitting: Cardiology

## 2013-05-31 DIAGNOSIS — I50810 Right heart failure, unspecified: Secondary | ICD-10-CM

## 2013-05-31 DIAGNOSIS — E876 Hypokalemia: Secondary | ICD-10-CM | POA: Diagnosis not present

## 2013-05-31 DIAGNOSIS — I509 Heart failure, unspecified: Secondary | ICD-10-CM

## 2013-05-31 DIAGNOSIS — R188 Other ascites: Secondary | ICD-10-CM

## 2013-05-31 DIAGNOSIS — I5189 Other ill-defined heart diseases: Secondary | ICD-10-CM | POA: Diagnosis present

## 2013-05-31 DIAGNOSIS — R600 Localized edema: Secondary | ICD-10-CM

## 2013-05-31 DIAGNOSIS — G4730 Sleep apnea, unspecified: Secondary | ICD-10-CM

## 2013-05-31 DIAGNOSIS — R0602 Shortness of breath: Secondary | ICD-10-CM | POA: Diagnosis present

## 2013-05-31 DIAGNOSIS — G4733 Obstructive sleep apnea (adult) (pediatric): Secondary | ICD-10-CM | POA: Diagnosis present

## 2013-05-31 DIAGNOSIS — I498 Other specified cardiac arrhythmias: Secondary | ICD-10-CM | POA: Diagnosis present

## 2013-05-31 DIAGNOSIS — R000 Tachycardia, unspecified: Secondary | ICD-10-CM

## 2013-05-31 DIAGNOSIS — R570 Cardiogenic shock: Secondary | ICD-10-CM

## 2013-05-31 DIAGNOSIS — E669 Obesity, unspecified: Secondary | ICD-10-CM | POA: Diagnosis present

## 2013-05-31 DIAGNOSIS — I428 Other cardiomyopathies: Secondary | ICD-10-CM | POA: Diagnosis present

## 2013-05-31 DIAGNOSIS — K219 Gastro-esophageal reflux disease without esophagitis: Secondary | ICD-10-CM | POA: Diagnosis present

## 2013-05-31 DIAGNOSIS — F4321 Adjustment disorder with depressed mood: Secondary | ICD-10-CM | POA: Diagnosis not present

## 2013-05-31 DIAGNOSIS — I472 Ventricular tachycardia, unspecified: Secondary | ICD-10-CM | POA: Diagnosis not present

## 2013-05-31 DIAGNOSIS — R030 Elevated blood-pressure reading, without diagnosis of hypertension: Secondary | ICD-10-CM

## 2013-05-31 DIAGNOSIS — I4729 Other ventricular tachycardia: Secondary | ICD-10-CM

## 2013-05-31 DIAGNOSIS — Z79899 Other long term (current) drug therapy: Secondary | ICD-10-CM

## 2013-05-31 DIAGNOSIS — R609 Edema, unspecified: Secondary | ICD-10-CM

## 2013-05-31 DIAGNOSIS — I5021 Acute systolic (congestive) heart failure: Secondary | ICD-10-CM

## 2013-05-31 DIAGNOSIS — N170 Acute kidney failure with tubular necrosis: Secondary | ICD-10-CM | POA: Diagnosis present

## 2013-05-31 DIAGNOSIS — Z8249 Family history of ischemic heart disease and other diseases of the circulatory system: Secondary | ICD-10-CM

## 2013-05-31 DIAGNOSIS — E119 Type 2 diabetes mellitus without complications: Secondary | ICD-10-CM | POA: Diagnosis present

## 2013-05-31 DIAGNOSIS — R7401 Elevation of levels of liver transaminase levels: Secondary | ICD-10-CM

## 2013-05-31 DIAGNOSIS — E785 Hyperlipidemia, unspecified: Secondary | ICD-10-CM | POA: Diagnosis present

## 2013-05-31 DIAGNOSIS — R0600 Dyspnea, unspecified: Secondary | ICD-10-CM

## 2013-05-31 DIAGNOSIS — R740 Nonspecific elevation of levels of transaminase and lactic acid dehydrogenase [LDH]: Secondary | ICD-10-CM

## 2013-05-31 DIAGNOSIS — K7200 Acute and subacute hepatic failure without coma: Secondary | ICD-10-CM

## 2013-05-31 DIAGNOSIS — I5023 Acute on chronic systolic (congestive) heart failure: Principal | ICD-10-CM | POA: Diagnosis present

## 2013-05-31 DIAGNOSIS — R7989 Other specified abnormal findings of blood chemistry: Secondary | ICD-10-CM

## 2013-05-31 DIAGNOSIS — R945 Abnormal results of liver function studies: Secondary | ICD-10-CM

## 2013-05-31 DIAGNOSIS — F411 Generalized anxiety disorder: Secondary | ICD-10-CM

## 2013-05-31 DIAGNOSIS — I959 Hypotension, unspecified: Secondary | ICD-10-CM

## 2013-05-31 LAB — I-STAT CHEM 8, ED
BUN: 28 mg/dL — AB (ref 6–23)
CHLORIDE: 101 meq/L (ref 96–112)
Calcium, Ion: 1.1 mmol/L — ABNORMAL LOW (ref 1.12–1.23)
Creatinine, Ser: 1.5 mg/dL — ABNORMAL HIGH (ref 0.50–1.35)
Glucose, Bld: 110 mg/dL — ABNORMAL HIGH (ref 70–99)
HCT: 51 % (ref 39.0–52.0)
Hemoglobin: 17.3 g/dL — ABNORMAL HIGH (ref 13.0–17.0)
Potassium: 4.3 mEq/L (ref 3.7–5.3)
SODIUM: 135 meq/L — AB (ref 137–147)
TCO2: 20 mmol/L (ref 0–100)

## 2013-05-31 NOTE — ED Notes (Signed)
Pt states he has decreased appetite,  Sob,  Decreased urine output,  Decreased bowel movement,  Abdominal swelling, liver area pain,    Nausea,  Vomiting,  Had ultrasound last night showed fluid on liver and lungs per patient and wife and he is has gained 5 lbs in a week and isn't eating much at all,  Can't walk up steps without sob

## 2013-06-01 ENCOUNTER — Other Ambulatory Visit: Payer: Self-pay

## 2013-06-01 ENCOUNTER — Inpatient Hospital Stay (HOSPITAL_COMMUNITY): Payer: BC Managed Care – PPO

## 2013-06-01 ENCOUNTER — Emergency Department (HOSPITAL_COMMUNITY): Payer: BC Managed Care – PPO

## 2013-06-01 DIAGNOSIS — R740 Nonspecific elevation of levels of transaminase and lactic acid dehydrogenase [LDH]: Secondary | ICD-10-CM

## 2013-06-01 DIAGNOSIS — I4729 Other ventricular tachycardia: Secondary | ICD-10-CM

## 2013-06-01 DIAGNOSIS — R0609 Other forms of dyspnea: Secondary | ICD-10-CM

## 2013-06-01 DIAGNOSIS — I498 Other specified cardiac arrhythmias: Secondary | ICD-10-CM

## 2013-06-01 DIAGNOSIS — R7401 Elevation of levels of liver transaminase levels: Secondary | ICD-10-CM | POA: Diagnosis present

## 2013-06-01 DIAGNOSIS — I428 Other cardiomyopathies: Secondary | ICD-10-CM

## 2013-06-01 DIAGNOSIS — R030 Elevated blood-pressure reading, without diagnosis of hypertension: Secondary | ICD-10-CM

## 2013-06-01 DIAGNOSIS — R7402 Elevation of levels of lactic acid dehydrogenase (LDH): Secondary | ICD-10-CM

## 2013-06-01 DIAGNOSIS — R0602 Shortness of breath: Secondary | ICD-10-CM | POA: Diagnosis present

## 2013-06-01 DIAGNOSIS — R0989 Other specified symptoms and signs involving the circulatory and respiratory systems: Secondary | ICD-10-CM

## 2013-06-01 DIAGNOSIS — I472 Ventricular tachycardia: Secondary | ICD-10-CM

## 2013-06-01 DIAGNOSIS — R7989 Other specified abnormal findings of blood chemistry: Secondary | ICD-10-CM | POA: Diagnosis present

## 2013-06-01 DIAGNOSIS — R791 Abnormal coagulation profile: Secondary | ICD-10-CM

## 2013-06-01 DIAGNOSIS — I369 Nonrheumatic tricuspid valve disorder, unspecified: Secondary | ICD-10-CM

## 2013-06-01 DIAGNOSIS — G4730 Sleep apnea, unspecified: Secondary | ICD-10-CM

## 2013-06-01 DIAGNOSIS — I509 Heart failure, unspecified: Secondary | ICD-10-CM

## 2013-06-01 LAB — CBC WITH DIFFERENTIAL/PLATELET
Basophils Absolute: 0 10*3/uL (ref 0.0–0.1)
Basophils Relative: 0 % (ref 0–1)
Eosinophils Absolute: 0 10*3/uL (ref 0.0–0.7)
Eosinophils Relative: 0 % (ref 0–5)
HCT: 47.2 % (ref 39.0–52.0)
HEMOGLOBIN: 15.7 g/dL (ref 13.0–17.0)
LYMPHS ABS: 1.5 10*3/uL (ref 0.7–4.0)
Lymphocytes Relative: 14 % (ref 12–46)
MCH: 30.1 pg (ref 26.0–34.0)
MCHC: 33.3 g/dL (ref 30.0–36.0)
MCV: 90.6 fL (ref 78.0–100.0)
MONOS PCT: 12 % (ref 3–12)
Monocytes Absolute: 1.3 10*3/uL — ABNORMAL HIGH (ref 0.1–1.0)
Neutro Abs: 7.9 10*3/uL — ABNORMAL HIGH (ref 1.7–7.7)
Neutrophils Relative %: 73 % (ref 43–77)
Platelets: 249 10*3/uL (ref 150–400)
RBC: 5.21 MIL/uL (ref 4.22–5.81)
RDW: 13.2 % (ref 11.5–15.5)
WBC: 10.8 10*3/uL — ABNORMAL HIGH (ref 4.0–10.5)

## 2013-06-01 LAB — PROTIME-INR
INR: 1.94 — ABNORMAL HIGH (ref 0.00–1.49)
INR: 3.61 — AB (ref 0.00–1.49)
Prothrombin Time: 21.6 seconds — ABNORMAL HIGH (ref 11.6–15.2)
Prothrombin Time: 34.6 seconds — ABNORMAL HIGH (ref 11.6–15.2)

## 2013-06-01 LAB — COMPREHENSIVE METABOLIC PANEL
ALK PHOS: 82 U/L (ref 39–117)
ALT: 484 U/L — ABNORMAL HIGH (ref 0–53)
AST: 351 U/L — ABNORMAL HIGH (ref 0–37)
Albumin: 3.3 g/dL — ABNORMAL LOW (ref 3.5–5.2)
BILIRUBIN TOTAL: 1.7 mg/dL — AB (ref 0.3–1.2)
BUN: 28 mg/dL — AB (ref 6–23)
CHLORIDE: 95 meq/L — AB (ref 96–112)
CO2: 20 mEq/L (ref 19–32)
Calcium: 9.1 mg/dL (ref 8.4–10.5)
Creatinine, Ser: 1.39 mg/dL — ABNORMAL HIGH (ref 0.50–1.35)
GFR calc Af Amer: 72 mL/min — ABNORMAL LOW (ref >90)
GFR calc non Af Amer: 62 mL/min — ABNORMAL LOW (ref >90)
Glucose, Bld: 111 mg/dL — ABNORMAL HIGH (ref 70–99)
Potassium: 4.6 mEq/L (ref 3.7–5.3)
Sodium: 132 mEq/L — ABNORMAL LOW (ref 137–147)
Total Protein: 5.9 g/dL — ABNORMAL LOW (ref 6.0–8.3)

## 2013-06-01 LAB — APTT: aPTT: 32 seconds (ref 24–37)

## 2013-06-01 LAB — PRO B NATRIURETIC PEPTIDE: Pro B Natriuretic peptide (BNP): 6513 pg/mL — ABNORMAL HIGH (ref 0–125)

## 2013-06-01 LAB — D-DIMER, QUANTITATIVE (NOT AT ARMC): D DIMER QUANT: 2.55 ug{FEU}/mL — AB (ref 0.00–0.48)

## 2013-06-01 LAB — TROPONIN I
Troponin I: <0.3 ng/mL (ref <0.30)
Troponin I: <0.3 ng/mL (ref <0.30)

## 2013-06-01 LAB — LIPID PANEL
CHOL/HDL RATIO: 8.8 ratio
CHOLESTEROL: 70 mg/dL (ref 0–200)
HDL: 8 mg/dL — ABNORMAL LOW (ref >39)
LDL Cholesterol: 47 mg/dL (ref 0–99)
Triglycerides: 75 mg/dL (ref <150)
VLDL: 15 mg/dL (ref 0–40)

## 2013-06-01 LAB — MRSA PCR SCREENING: MRSA by PCR: NEGATIVE

## 2013-06-01 LAB — HEPATITIS PANEL, ACUTE
HCV Ab: NEGATIVE
HEP B S AG: NEGATIVE
Hep A IgM: NONREACTIVE
Hep B C IgM: NONREACTIVE

## 2013-06-01 LAB — RAPID URINE DRUG SCREEN, HOSP PERFORMED
Amphetamines: NOT DETECTED
Barbiturates: NOT DETECTED
Benzodiazepines: NOT DETECTED
COCAINE: NOT DETECTED
OPIATES: NOT DETECTED
Tetrahydrocannabinol: POSITIVE — AB

## 2013-06-01 LAB — TSH: TSH: 2.607 u[IU]/mL (ref 0.350–4.500)

## 2013-06-01 LAB — MAGNESIUM: Magnesium: 1.8 mg/dL (ref 1.5–2.5)

## 2013-06-01 LAB — I-STAT TROPONIN, ED: TROPONIN I, POC: 0.01 ng/mL (ref 0.00–0.08)

## 2013-06-01 MED ORDER — SODIUM CHLORIDE 0.9 % IJ SOLN
3.0000 mL | Freq: Two times a day (BID) | INTRAMUSCULAR | Status: DC
  Administered 2013-06-01 – 2013-06-08 (×8): 3 mL via INTRAVENOUS

## 2013-06-01 MED ORDER — FUROSEMIDE 10 MG/ML IJ SOLN
40.0000 mg | Freq: Two times a day (BID) | INTRAMUSCULAR | Status: DC
  Administered 2013-06-01: 40 mg via INTRAVENOUS
  Filled 2013-06-01: qty 4

## 2013-06-01 MED ORDER — ASPIRIN EC 81 MG PO TBEC
81.0000 mg | DELAYED_RELEASE_TABLET | Freq: Every day | ORAL | Status: DC
  Administered 2013-06-01: 81 mg via ORAL
  Filled 2013-06-01: qty 1

## 2013-06-01 MED ORDER — HYDROMORPHONE HCL PF 1 MG/ML IJ SOLN
0.5000 mg | INTRAMUSCULAR | Status: DC | PRN
  Administered 2013-06-01: 0.5 mg via INTRAVENOUS
  Filled 2013-06-01: qty 1

## 2013-06-01 MED ORDER — IOHEXOL 350 MG/ML SOLN
100.0000 mL | Freq: Once | INTRAVENOUS | Status: AC | PRN
  Administered 2013-06-01: 100 mL via INTRAVENOUS

## 2013-06-01 MED ORDER — ACETAMINOPHEN 325 MG PO TABS: 650.0000 mg | ORAL_TABLET | ORAL | Status: DC | PRN

## 2013-06-01 MED ORDER — CARVEDILOL 3.125 MG PO TABS
3.1250 mg | ORAL_TABLET | Freq: Two times a day (BID) | ORAL | Status: DC
  Administered 2013-06-01 (×2): 3.125 mg via ORAL
  Filled 2013-06-01 (×6): qty 1

## 2013-06-01 MED ORDER — ONDANSETRON HCL 4 MG/2ML IJ SOLN: 4.0000 mg | Freq: Four times a day (QID) | INTRAMUSCULAR | Status: DC | PRN

## 2013-06-01 MED ORDER — ALPRAZOLAM 0.5 MG PO TABS
0.5000 mg | ORAL_TABLET | Freq: Every evening | ORAL | Status: DC | PRN
  Administered 2013-06-01 – 2013-06-07 (×5): 0.5 mg via ORAL
  Filled 2013-06-01 (×5): qty 1

## 2013-06-01 MED ORDER — MAGNESIUM SULFATE 40 MG/ML IJ SOLN
2.0000 g | Freq: Once | INTRAMUSCULAR | Status: AC
  Administered 2013-06-01: 2 g via INTRAVENOUS
  Filled 2013-06-01: qty 50

## 2013-06-01 MED ORDER — LIVING BETTER WITH HEART FAILURE BOOK
Freq: Once | Status: AC
  Administered 2013-06-01: 12:00:00
  Filled 2013-06-01: qty 1

## 2013-06-01 MED ORDER — FUROSEMIDE 10 MG/ML IJ SOLN
40.0000 mg | Freq: Once | INTRAMUSCULAR | Status: AC
  Administered 2013-06-01: 40 mg via INTRAVENOUS
  Filled 2013-06-01: qty 4

## 2013-06-01 MED ORDER — HEPARIN (PORCINE) IN NACL 100-0.45 UNIT/ML-% IJ SOLN
1700.0000 [IU]/h | INTRAMUSCULAR | Status: DC
  Administered 2013-06-01 – 2013-06-02 (×2): 1700 [IU]/h via INTRAVENOUS
  Filled 2013-06-01 (×3): qty 250

## 2013-06-01 MED ORDER — ENOXAPARIN SODIUM 30 MG/0.3ML ~~LOC~~ SOLN
30.0000 mg | SUBCUTANEOUS | Status: DC
  Administered 2013-06-01: 30 mg via SUBCUTANEOUS
  Filled 2013-06-01: qty 0.3

## 2013-06-01 MED ORDER — DEXTROMETHORPHAN POLISTIREX 30 MG/5ML PO LQCR
30.0000 mg | Freq: Two times a day (BID) | ORAL | Status: DC | PRN
  Administered 2013-06-02 – 2013-06-05 (×4): 30 mg via ORAL
  Filled 2013-06-01 (×5): qty 5

## 2013-06-01 MED ORDER — SODIUM CHLORIDE 0.9 % IJ SOLN
3.0000 mL | INTRAMUSCULAR | Status: DC | PRN
  Administered 2013-06-01: 3 mL via INTRAVENOUS

## 2013-06-01 MED ORDER — FUROSEMIDE 10 MG/ML IJ SOLN
40.0000 mg | Freq: Once | INTRAMUSCULAR | Status: AC
  Administered 2013-06-01: 40 mg via INTRAVENOUS

## 2013-06-01 MED ORDER — POTASSIUM CHLORIDE CRYS ER 20 MEQ PO TBCR
20.0000 meq | EXTENDED_RELEASE_TABLET | Freq: Every day | ORAL | Status: DC
  Administered 2013-06-01 – 2013-06-03 (×3): 20 meq via ORAL
  Filled 2013-06-01 (×4): qty 1

## 2013-06-01 MED ORDER — PNEUMOCOCCAL VAC POLYVALENT 25 MCG/0.5ML IJ INJ
0.5000 mL | INJECTION | INTRAMUSCULAR | Status: AC
  Administered 2013-06-03: 0.5 mL via INTRAMUSCULAR
  Filled 2013-06-01 (×2): qty 0.5

## 2013-06-01 MED ORDER — PANTOPRAZOLE SODIUM 40 MG PO TBEC
40.0000 mg | DELAYED_RELEASE_TABLET | Freq: Every day | ORAL | Status: DC
  Administered 2013-06-01 – 2013-06-09 (×8): 40 mg via ORAL
  Filled 2013-06-01 (×8): qty 1

## 2013-06-01 MED ORDER — POTASSIUM CHLORIDE CRYS ER 10 MEQ PO TBCR
10.0000 meq | EXTENDED_RELEASE_TABLET | Freq: Every day | ORAL | Status: DC
  Administered 2013-06-01: 10 meq via ORAL
  Filled 2013-06-01: qty 1

## 2013-06-01 MED ORDER — INFLUENZA VAC SPLIT QUAD 0.5 ML IM SUSP
0.5000 mL | INTRAMUSCULAR | Status: AC
  Administered 2013-06-03: 0.5 mL via INTRAMUSCULAR
  Filled 2013-06-01 (×2): qty 0.5

## 2013-06-01 MED ORDER — HEPARIN BOLUS VIA INFUSION
4000.0000 [IU] | Freq: Once | INTRAVENOUS | Status: AC
  Administered 2013-06-01: 4000 [IU] via INTRAVENOUS
  Filled 2013-06-01: qty 4000

## 2013-06-01 MED ORDER — FUROSEMIDE 10 MG/ML IJ SOLN
40.0000 mg | Freq: Three times a day (TID) | INTRAMUSCULAR | Status: DC
  Administered 2013-06-02 – 2013-06-03 (×4): 40 mg via INTRAVENOUS
  Filled 2013-06-01 (×6): qty 4

## 2013-06-01 MED ORDER — ALPRAZOLAM 0.25 MG PO TABS
0.2500 mg | ORAL_TABLET | Freq: Three times a day (TID) | ORAL | Status: DC | PRN
  Administered 2013-06-01 – 2013-06-06 (×5): 0.25 mg via ORAL
  Filled 2013-06-01 (×5): qty 1

## 2013-06-01 MED ORDER — SODIUM CHLORIDE 0.9 % IV SOLN
250.0000 mL | INTRAVENOUS | Status: DC | PRN
  Administered 2013-06-01 – 2013-06-03 (×3): 250 mL via INTRAVENOUS
  Administered 2013-06-04: 13 mL via INTRAVENOUS
  Administered 2013-06-05: 11:00:00 via INTRAVENOUS

## 2013-06-01 NOTE — Progress Notes (Signed)
Echocardiogram 2D Echocardiogram has been performed.  [name] 06/01/2013, 1:35 PM

## 2013-06-01 NOTE — Progress Notes (Signed)
Called Echo department to request that ECHO be performed today, as instructed by Dr. Ardyth Harps.

## 2013-06-01 NOTE — H&P (Signed)
[facility] History and Physical  Troy NettersJonathan Manning ZOX:[id number] DOB: 1971/02/05 DOA: 05/31/2013  Referring physician:   EDP PCP: Troy MannsMarne Tower, MD  Specialists:   Chief Complaint:  SOB, Swelling of ABD Swelling and Both Legs  HPI: [name] is a 43 y.o. male who was previously healthy until 1 month ago when he began to have swelling of both of his lower legs and feet.   The swelling progressed and he reports the he began to have swelling of his ABD .  He reports that 1 week ago henoticed that he had an uninentional weight gain of 5 pounds.   He began to have worsening SOB and DOE this week. He denies having any chest pain.  He saw his PCP and was sent for an Ultrasound of the ABD 1 day ago which found Ascites and Heaptomegaly.    He came into the ED tonight due to worsening SOB, and pain in his legs and feet from the swelling.   In the ED, he was evaluated and was found to have a BNP= 6513.0 and a Chest X-ray which revealed Fluid Overload, cardiomegaly, and small bilateral effusions.   He was administered IV lasix x 1 and began to diurese and feel better. He was referred for medical admission.         Review of Systems:  Constitutional: No Weight Loss, +Weight Gain, Night Sweats, Fevers, Chills, Fatigue, or Generalized Weakness HEENT: No Headaches, Difficulty Swallowing,Tooth/Dental Problems,Sore Throat,  No Sneezing, Rhinitis, Ear Ache, Nasal Congestion, or Post Nasal Drip,  Cardio-vascular:  No Chest pain, Orthopnea, PND, +Edema in lower extremities, Anasarca, Dizziness, Palpitations  Resp:  +Dyspnea, +DOE, No Productive Cough, No Non-Productive Cough, No Hemoptysis, No Wheezing.    GI: No Heartburn, Indigestion, Abdominal Pain, Nausea, Vomiting, Diarrhea, Change in Bowel Habits,  Loss of Appetite  GU: No Dysuria, Change in Color of Urine, No Urgency or Frequency.  No Flank Pain.  Musculoskeletal: No Joint Pain or Swelling.  No Decreased Range of Motion. No Back Pain.   Neurologic: No Syncope, No Seizures, Muscle Weakness, Paresthesia, Vision Disturbance or Loss, No Diplopia, No Vertigo, No Difficulty Walking,  Skin: No Rash or Lesions. Psych: No Change in Mood or Affect. No Depression or Anxiety. No Memory loss. No Confusion or Hallucinations   Past Medical History  Diagnosis Date  . Hypertension   . Athlete's foot       History reviewed. No pertinent past surgical history.     Prior to Admission medications   Medication Sig Start Date End Date Taking? Authorizing Provider  ALPRAZolam Prudy Feeler(XANAX) 0.5 MG tablet Take 0.5 mg by mouth at bedtime as needed for sleep.   Yes Historical Provider, MD  bismuth subsalicylate (PEPTO BISMOL) 262 MG/15ML suspension Take 30 mLs by mouth every 6 (six) hours as needed for indigestion.   Yes Historical Provider, MD  dextromethorphan (DELSYM) 30 MG/5ML liquid Take 30 mg by mouth 2 (two) times daily as needed for cough.   Yes Historical Provider, MD  omeprazole (PRILOSEC) 20 MG capsule Take 20 mg by mouth 2 (two) times daily before a meal. 05/23/13  Yes Troy PimpleMarne A Tower, MD  ondansetron (ZOFRAN) 4 MG tablet Take 4 mg by mouth every 8 (eight) hours as needed for nausea or vomiting.   Yes Historical Provider, MD  ranitidine (ZANTAC) 150 MG tablet Take 150 mg by mouth daily as needed for heartburn.   Yes Historical Provider, MD      No Known Allergies   Social  History:  reports that he has never smoked. He does not have any smokeless tobacco history on file. He reports that he drinks alcohol. His drug history is not on file.     Family History  Problem Relation Age of Onset  . Hypertension Mother   . Diabetes Mother   . Hypertension Maternal Grandmother   . Diabetes Maternal Grandmother   . Heart disease Maternal Grandmother        Physical Exam:  GEN:  Pleasant Obese Well nourished and Well developed 43 y.o. [name]  examined  and in no acute distress; cooperative with exam Filed Vitals:   06/01/13 0148  06/01/13 0200 06/01/13 0230 06/01/13 0327  BP: 106/82 105/83 118/91 123/85  Pulse: 143 140 144 112  Temp:    98.4 F (36.9 C)  TempSrc:    Oral  Resp: 34 46 34 22  Weight:      SpO2: 96% 94% 97% 98%   Blood pressure 123/85, pulse 112, temperature 98.4 F (36.9 C), temperature source Oral, resp. rate 22, weight 105.688 kg (233 lb), SpO2 98.00%. PSYCH: He is alert and oriented x4; does not appear anxious does not appear depressed; affect is normal HEENT: Normocephalic and Atraumatic, Mucous membranes pink; PERRLA; EOM intact; Fundi:  Benign;  No scleral icterus, Nares: Patent, Oropharynx: Clear, Fair Dentition, Neck:  FROM, no cervical lymphadenopathy nor thyromegaly or carotid bruit; no JVD; Breasts:: Not examined CHEST WALL: No tenderness CHEST: Normal respiration, clear to auscultation bilaterally HEART: Regular rate and rhythm; no murmurs rubs or gallops BACK: No kyphosis or scoliosis; no CVA tenderness ABDOMEN: Positive Bowel Sounds, Obese, and mildly distended,  soft non-tender   + Fluid Wave,  No masses, + Hepatomegaly,  No splenomegaly palpated, no intertriginous candida. Rectal Exam: Not done EXTREMITIES: No cyanosis, clubbing or  3+ Pitting EDEMA  to BLEs; no ulcerations. Genitalia: not examined PULSES: 2+ and symmetric SKIN: Normal hydration no rash or ulceration CNS:   Vascular: pulses palpable throughout    Labs on Admission:  Basic Metabolic Panel:  Recent Labs Lab 05/30/13 1151 05/31/13 2358 06/01/13 0029  NA 134* 135* 132*  K 4.6 4.3 4.6  CL 101 101 95*  CO2 24  --  20  GLUCOSE 110* 110* 111*  BUN 18 28* 28*  CREATININE 1.5 1.50* 1.39*  CALCIUM 8.9  --  9.1   Liver Function Tests:  Recent Labs Lab 05/30/13 1151 06/01/13 0029  AST 98* 351*  ALT 157* 484*  ALKPHOS 67 82  BILITOT 2.1* 1.7*  PROT 5.9* 5.9*  ALBUMIN 3.6 3.3*    Recent Labs Lab 05/30/13 1151  LIPASE 44.0   No results found for this basename: AMMONIA,  in the last 168  hours CBC:  Recent Labs Lab 05/30/13 1151 05/31/13 2353 05/31/13 2358  WBC 11.8* 10.8*  --   NEUTROABS  --  7.9*  --   HGB 15.6 15.7 17.3*  HCT 47.6 47.2 51.0  MCV 91.8 90.6  --   PLT 248.0 249  --    Cardiac Enzymes: No results found for this basename: CKTOTAL, CKMB, CKMBINDEX, TROPONINI,  in the last 168 hours  BNP (last 3 results)  Recent Labs  06/01/13 0029  PROBNP 6513.0*   CBG: No results found for this basename: GLUCAP,  in the last 168 hours  Radiological Exams on Admission: Dg Chest 2 View  06/01/2013   CLINICAL DATA:  Chest pain  EXAM: CHEST  2 VIEW  COMPARISON:  None.  FINDINGS:  Cardiac enlargement. Small pleural effusions bilaterally. Possible interstitial edema. Mild vascular congestion. Negative for pneumonia.  IMPRESSION: Mild fluid overload   Electronically Signed   By: Marlan Palau M.D.   On: 06/01/2013 01:51   Dg Abd 1 View  05/30/2013   CLINICAL DATA:  Vomiting.  Abdominal pain and bloating.  Cramping.  EXAM: ABDOMEN - 1 VIEW  COMPARISON:  None.  FINDINGS: There is air scattered throughout nondistended loops of large or small bowel. No visible free air or free fluid on the supine radiograph. Osseous structures are normal. No worrisome abdominal calcifications.  IMPRESSION: Benign appearing abdomen.   Electronically Signed   By: Geanie Cooley M.D.   On: 05/30/2013 13:54   US Abdomen Complete  05/30/2013   CLINICAL DATA:  Abdominal pain and vomiting.  EXAM: ULTRASOUND ABDOMEN COMPLETE  COMPARISON:  None.  FINDINGS: Gallbladder:  Gallbladder wall is thickened at 3.8 mm. No gallstones or sludge. Negative sonographic Murphy's sign.  Common bile duct:  Diameter: 3.6 mm, normal.  Liver:  Hepatomegaly. Small amount of ascites around the liver. No focal liver lesions. No dilated bile ducts.  IVC:  No abnormality visualized.  Pancreas:  The body of the pancreas is normal. The head and extreme tail of the pancreas are obscured.  Spleen:  Normal.  10.6 cm in length.  Right  Kidney:  Length: 10.4 cm. Echogenicity within normal limits. No mass or hydronephrosis visualized.  Left Kidney:  Length: 12.5 cm. Echogenicity within normal limits. No mass or hydronephrosis visualized.  Abdominal aorta:  Normal.  2.1 cm maximum diameter.  Other findings:  Decreased velocity of portal vein flow suggesting portal hypertension.  There is a  moderate right pleural effusion.  IMPRESSION: 1. Hepatomegaly with the decrease portal vein flow, ascites and right pleural effusion. I suspect the patient has intrinsic liver disease. 2. Slight thickening of the gallbladder wall is probably due to low albumin or protein. I doubt that this represents acute acalculous cholecystitis.   Electronically Signed   By: Geanie Cooley M.D.   On: 05/30/2013 18:54      EKG: Independently reviewed. Sinus Tachycardia at rate of 119,  No acute S-T changes,  Q-Waves in Anterior Leads.       Assessment/Plan:   43 y.o. male with  Principal Problem:   CHF (congestive heart failure) Active Problems:   Sinus tachycardia   SOB (shortness of breath)   Elevated transaminase level   Elevated d-dimer   SLEEP APNEA   ELEVATED BLOOD PRESSURE WITHOUT DIAGNOSIS OF HYPERTENSION   HYPERLIPIDEMIA   EKG Changes (Old)     1.  Acute and New CHF-  BNP = 6513.0,  CHF protocol ordered, Diureses with IV Lasix q 12 x 4 doses, with K+ supplement,  Add Carvedilol, BID, but no ACE inhibitor RX due to Renal Insufficiency.   BiPAP ordered for CHF and OSA.    2.  Sinus Tachycardia-   CTA Chest ordered due to Elevated D-Dimer to rule out PE,  Also check TSH level.     3.  SOB due to #1 ,   O2 PRN.    4.   Elevated Transaminases due to Right Heart Failure causing Vascular Congestion in the portal system.   Monitor LFTS, Note that his Albumin is WNL so is not Liver Disease.     5.   Elevated D-Dimer-   CTA of Chest ordered and pending.       6.   Sleep Apnea- self reported, reports  that he had in past and he is scheduled to have   An Outpatient Sleep study.       7.   Elevated Blood Pressure-  Monitor BPs, Medications initiated for CHF are Anti-Hypertensive.     8.   Hyperlipidemia-   Self reported history,  On no medications at this time , check fasting Lipids in AM.     9 .  EKG changes-   No Knowledge of any prior Cardiac Event, No chest Pain,  Send troponin x 1.     10.  Renal Insufficiency- Monitor BUN/Cr.    11.  DVT prophylaxis with Lovenox.       Code Status:   FULL CODE Family Communication:    Wife at bedside Disposition Plan:     Inpatient     Time spent:  63 Minutes  Ron Parker [facility] Pager [phone]  If 7PM-7AM, please contact night-coverage [email or web address] Password [facility] 06/01/2013, 3:29 AM

## 2013-06-01 NOTE — Progress Notes (Signed)
Patient having frequent episodes of non-sustained V-Tach.  Patient denies shob or chest pain. Vital signs remain unchanged. See flow sheet.Text paged Dr. Ardyth Harps.  Dr. Ardyth Harps called and is coming to assess the patient.

## 2013-06-01 NOTE — Progress Notes (Signed)
Patient transported via [facility] with RN and [facility] staff to Cardiac ICU.  Spouse notified at bedside.Hand off report given bedside at [facility].

## 2013-06-01 NOTE — ED Notes (Signed)
Empted urinal in room cleaning.

## 2013-06-01 NOTE — ED Notes (Signed)
Dr. Denton Lank notified of the change in Pt's vital signs.

## 2013-06-01 NOTE — Progress Notes (Addendum)
Spoke w/ Dr Donnie Aho Re: INR 3.6 despite no Coumadin and d/t shocked liver.  Pt may be auto-anticoagulated but per our discussion it is felt with pt's current status and mural thrombus in left ventricle that heparin is still needed to ensure proper anticoagulation.  At this point we will aim for lower heparin level goal of 0.3-0.5 and monitor signs of bleeding.  Have asked RN to have heparin level and CBC drawn and run stat.  According to the literature, elevated INR in acute liver dysfunction does not correlate with full anticoagulation.  In addition to low levels of coagulation factors, there are also comparatively low levels of anticoagulant factors like antithrombin and protein C.  This supports the use of heparin despite elevated INR.   Vernard Gambles, PharmD, BCPS 06/01/2013 10:39 PM

## 2013-06-01 NOTE — Progress Notes (Signed)
ANTICOAGULATION CONSULT NOTE - Initial Consult  Pharmacy Consult for Heparin Indication: Left Ventricular Mural Thrombus  No Known Allergies  Patient Measurements: Height: 6' (182.9 cm) Weight: 228 lb 9.9 oz (103.7 kg) IBW/kg (Calculated) : 77.6 Heparin Dosing Weight: 99kg  Vital Signs: Temp: 98.3 F (36.8 C) (03/15 1555) Temp src: Oral (03/15 0800) BP: 90/73 mmHg (03/15 1555) Pulse Rate: 106 (03/15 1555)  Labs:  Recent Labs  05/30/13 1151 05/31/13 2353 05/31/13 2358 06/01/13 0029 06/01/13 0520 06/01/13 0948  HGB 15.6 15.7 17.3*  --   --   --   HCT 47.6 47.2 51.0  --   --   --   PLT 248.0 249  --   --   --   --   CREATININE 1.5  --  1.50* 1.39*  --   --   TROPONINI  --   --   --   --  <0.30 <0.30    Estimated Creatinine Clearance: 87.9 ml/min (by C-G formula based on Cr of 1.39).   Medical History: Past Medical History  Diagnosis Date  . Hypertension   . 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  Pharmacy consulted to dose IV heparin.  Noted patient also on ASA 81 daily, Lovenox 30mg  daily (LD 0900).    Heparin dosing weight = 99kg.   Baseline CBC: No issues noted Ordered STAT PT/INR, aPTT Renal fxn: CrCl 88  Goal of Therapy:  Heparin level 0.3-0.7 units/ml Monitor platelets by anticoagulation protocol: Yes   Plan:  IV heparin bolus 4000 unit bolus, then start infusion at 1700 units/hr. F/u heparin level in 6 hours, CBC, renal function, clinical course.   Haynes Hoehn, PharmD, BCPS 06/01/2013, 4:24 PM  Pager: [phone]

## 2013-06-01 NOTE — ED Notes (Signed)
Denies CP.

## 2013-06-01 NOTE — Progress Notes (Signed)
Pt. Was placed on CPAP auto titrate (min: 4, max: 18) via nasal mask with 3L [place] bled in. Pt. Is tolerating CPAP well at this time without any complications. Pt. Doesn't wear CPAP at home & is scheduled for a sleep study.

## 2013-06-01 NOTE — ED Notes (Signed)
Patient has +3 pitting edema in both lower extremities

## 2013-06-01 NOTE — ED Provider Notes (Signed)
CSN: [id number]     Arrival date & time 05/31/13  2313 History   First MD Initiated Contact with Patient 06/01/13 0016     Chief Complaint  Patient presents with  . Shortness of Breath  . Leg Swelling     (Consider location/radiation/quality/duration/timing/severity/associated sxs/prior Treatment) Patient is a 43 y.o. male presenting with shortness of breath. The history is provided by the patient.  Shortness of Breath Associated symptoms: no abdominal pain, no chest pain, no cough, no fever, no headaches, no neck pain, no rash and no sore throat   pt c/o decreased appetite, decreased po, decreased uo, abd swelling, leg swelling bil, upper abd discomfort, nausea, for the past 4  weeks. Symptoms constant, slowly getting worse. Notes 5 lb weight increased in past week. Has seen pcp x 2 for same without specific diagnosis. States now sob w exertion/walking up steps. Denies prior hx chf or liver disease. No orthopnea/pnd. No current or recent cp or discomfort. No cough or uri c/o. No fever or chills. No hx etoh abuse. No hx hepatitis. Prior to above symptoms, denies any cp or discomfort, denies any acute onset sob, symptoms have been slowly, gradually progressive.     Past Medical History  Diagnosis Date  . Hypertension   . Athlete's foot    History reviewed. No pertinent past surgical history. Family History  Problem Relation Age of Onset  . Hypertension Mother   . Diabetes Mother   . Hypertension Maternal Grandmother   . Diabetes Maternal Grandmother   . Heart disease Maternal Grandmother    History  Substance Use Topics  . Smoking status: Never Smoker   . Smokeless tobacco: Not on file  . Alcohol Use: Yes     Comment: rare    Review of Systems  Constitutional: Negative for fever and chills.  HENT: Negative for sore throat.   Eyes: Negative for redness.  Respiratory: Positive for shortness of breath. Negative for cough.   Cardiovascular: Negative for chest pain.   Gastrointestinal: Positive for nausea. Negative for abdominal pain and diarrhea.  Genitourinary: Negative for dysuria and flank pain.  Musculoskeletal: Negative for back pain and neck pain.  Skin: Negative for rash.  Neurological: Negative for weakness, numbness and headaches.  Hematological: Does not bruise/bleed easily.  Psychiatric/Behavioral: Negative for confusion.      Allergies  Review of patient's allergies indicates no known allergies.  Home Medications   Current Outpatient Rx  Name  Route  Sig  Dispense  Refill  . fish oil-omega-3 fatty acids 1000 MG capsule   Oral   Take 1 g by mouth 2 (two) times daily.          Marland Kitchen omeprazole (PRILOSEC) 20 MG capsule   Oral   Take 20 mg by mouth 2 (two) times daily before a meal.         . ondansetron (ZOFRAN) 4 MG tablet   Oral   Take 1 tablet (4 mg total) by mouth every 8 (eight) hours as needed.   20 tablet   0    BP 112/84  Pulse 117  Temp(Src) 98 F (36.7 C) (Oral)  Resp 20  Wt 233 lb (105.688 kg)  SpO2 97% Physical Exam  Nursing note and vitals reviewed. Constitutional: He is oriented to person, place, and time. He appears well-developed and well-nourished. No distress.  HENT:  Mouth/Throat: Oropharynx is clear and moist.  Eyes: Conjunctivae are normal. Pupils are equal, round, and reactive to light. No scleral icterus.  Neck: Neck supple. No tracheal deviation present. No thyromegaly present.  Cardiovascular: Regular rhythm, normal heart sounds and intact distal pulses.   Pulmonary/Chest: Effort normal and breath sounds normal. No accessory muscle usage. No respiratory distress.  Abdominal: Soft. Bowel sounds are normal. He exhibits no distension and no mass. There is no tenderness. There is no rebound and no guarding.  Genitourinary:  No cva tenderness  Musculoskeletal: Normal range of motion. He exhibits no edema and no tenderness.  Neurological: He is alert and oriented to person, place, and time.   Skin: Skin is warm and dry. No rash noted. He is not diaphoretic.  Psychiatric: He has a normal mood and affect.    ED Course  Procedures (including critical care time)   Results for orders placed during the hospital encounter of 05/31/13  CBC WITH DIFFERENTIAL      Result Value Ref Range   WBC 10.8 (*) 4.0 - 10.5 K/uL   RBC 5.21  4.22 - 5.81 MIL/uL   Hemoglobin 15.7  13.0 - 17.0 g/dL   HCT 96.7  89.3 - 81.0 %   MCV 90.6  78.0 - 100.0 fL   MCH 30.1  26.0 - 34.0 pg   MCHC 33.3  30.0 - 36.0 g/dL   RDW 17.5  10.2 - 58.5 %   Platelets 249  150 - 400 K/uL   Neutrophils Relative % 73  43 - 77 %   Neutro Abs 7.9 (*) 1.7 - 7.7 K/uL   Lymphocytes Relative 14  12 - 46 %   Lymphs Abs 1.5  0.7 - 4.0 K/uL   Monocytes Relative 12  3 - 12 %   Monocytes Absolute 1.3 (*) 0.1 - 1.0 K/uL   Eosinophils Relative 0  0 - 5 %   Eosinophils Absolute 0.0  0.0 - 0.7 K/uL   Basophils Relative 0  0 - 1 %   Basophils Absolute 0.0  0.0 - 0.1 K/uL  COMPREHENSIVE METABOLIC PANEL      Result Value Ref Range   Sodium 132 (*) 137 - 147 mEq/L   Potassium 4.6  3.7 - 5.3 mEq/L   Chloride 95 (*) 96 - 112 mEq/L   CO2 20  19 - 32 mEq/L   Glucose, Bld 111 (*) 70 - 99 mg/dL   BUN 28 (*) 6 - 23 mg/dL   Creatinine, Ser 2.77 (*) 0.50 - 1.35 mg/dL   Calcium 9.1  8.4 - 82.4 mg/dL   Total Protein 5.9 (*) 6.0 - 8.3 g/dL   Albumin 3.3 (*) 3.5 - 5.2 g/dL   AST 235 (*) 0 - 37 U/L   ALT 484 (*) 0 - 53 U/L   Alkaline Phosphatase 82  39 - 117 U/L   Total Bilirubin 1.7 (*) 0.3 - 1.2 mg/dL   GFR calc non Af Amer 62 (*) >90 mL/min   GFR calc Af Amer 72 (*) >90 mL/min  D-DIMER, QUANTITATIVE      Result Value Ref Range   D-Dimer, Quant 2.55 (*) 0.00 - 0.48 ug/mL-FEU  PRO B NATRIURETIC PEPTIDE      Result Value Ref Range   Pro B Natriuretic peptide (BNP) 6513.0 (*) 0 - 125 pg/mL  I-STAT CHEM 8, ED      Result Value Ref Range   Sodium 135 (*) 137 - 147 mEq/L   Potassium 4.3  3.7 - 5.3 mEq/L   Chloride 101  96 - 112  mEq/L   BUN 28 (*) 6 - 23 mg/dL  Creatinine, Ser 1.50 (*) 0.50 - 1.35 mg/dL   Glucose, Bld 161110 (*) 70 - 99 mg/dL   Calcium, Ion 0.961.10 (*) 1.12 - 1.23 mmol/L   TCO2 20  0 - 100 mmol/L   Hemoglobin 17.3 (*) 13.0 - 17.0 g/dL   HCT 04.551.0  40.939.0 - 81.152.0 %  I-STAT TROPOININ, ED      Result Value Ref Range   Troponin i, poc 0.01  0.00 - 0.08 ng/mL   Comment 3            Dg Chest 2 View  06/01/2013   CLINICAL DATA:  Chest pain  EXAM: CHEST  2 VIEW  COMPARISON:  None.  FINDINGS: Cardiac enlargement. Small pleural effusions bilaterally. Possible interstitial edema. Mild vascular congestion. Negative for pneumonia.  IMPRESSION: Mild fluid overload   Electronically Signed   By: Marlan Palauharles  Clark M.D.   On: 06/01/2013 01:51   Dg Abd 1 View  05/30/2013   CLINICAL DATA:  Vomiting.  Abdominal pain and bloating.  Cramping.  EXAM: ABDOMEN - 1 VIEW  COMPARISON:  None.  FINDINGS: There is air scattered throughout nondistended loops of large or small bowel. No visible free air or free fluid on the supine radiograph. Osseous structures are normal. No worrisome abdominal calcifications.  IMPRESSION: Benign appearing abdomen.   Electronically Signed   By: Geanie CooleyJim  Maxwell M.D.   On: 05/30/2013 13:54   [place] Abdomen Complete  05/30/2013   CLINICAL DATA:  Abdominal pain and vomiting.  EXAM: ULTRASOUND ABDOMEN COMPLETE  COMPARISON:  None.  FINDINGS: Gallbladder:  Gallbladder wall is thickened at 3.8 mm. No gallstones or sludge. Negative sonographic Murphy's sign.  Common bile duct:  Diameter: 3.6 mm, normal.  Liver:  Hepatomegaly. Small amount of ascites around the liver. No focal liver lesions. No dilated bile ducts.  IVC:  No abnormality visualized.  Pancreas:  The body of the pancreas is normal. The head and extreme tail of the pancreas are obscured.  Spleen:  Normal.  10.6 cm in length.  Right Kidney:  Length: 10.4 cm. Echogenicity within normal limits. No mass or hydronephrosis visualized.  Left Kidney:  Length: 12.5 cm.  Echogenicity within normal limits. No mass or hydronephrosis visualized.  Abdominal aorta:  Normal.  2.1 cm maximum diameter.  Other findings:  Decreased velocity of portal vein flow suggesting portal hypertension.  There is a  moderate right pleural effusion.  IMPRESSION: 1. Hepatomegaly with the decrease portal vein flow, ascites and right pleural effusion. I suspect the patient has intrinsic liver disease. 2. Slight thickening of the gallbladder wall is probably due to low albumin or protein. I doubt that this represents acute acalculous cholecystitis.   Electronically Signed   By: Geanie CooleyJim  Maxwell M.D.   On: 05/30/2013 18:54     Dg Abd 1 View  05/30/2013   CLINICAL DATA:  Vomiting.  Abdominal pain and bloating.  Cramping.  EXAM: ABDOMEN - 1 VIEW  COMPARISON:  None.  FINDINGS: There is air scattered throughout nondistended loops of large or small bowel. No visible free air or free fluid on the supine radiograph. Osseous structures are normal. No worrisome abdominal calcifications.  IMPRESSION: Benign appearing abdomen.   Electronically Signed   By: Geanie CooleyJim  Maxwell M.D.   On: 05/30/2013 13:54   [place] Abdomen Complete  05/30/2013   CLINICAL DATA:  Abdominal pain and vomiting.  EXAM: ULTRASOUND ABDOMEN COMPLETE  COMPARISON:  None.  FINDINGS: Gallbladder:  Gallbladder wall is thickened at 3.8 mm. No gallstones or  sludge. Negative sonographic Murphy's sign.  Common bile duct:  Diameter: 3.6 mm, normal.  Liver:  Hepatomegaly. Small amount of ascites around the liver. No focal liver lesions. No dilated bile ducts.  IVC:  No abnormality visualized.  Pancreas:  The body of the pancreas is normal. The head and extreme tail of the pancreas are obscured.  Spleen:  Normal.  10.6 cm in length.  Right Kidney:  Length: 10.4 cm. Echogenicity within normal limits. No mass or hydronephrosis visualized.  Left Kidney:  Length: 12.5 cm. Echogenicity within normal limits. No mass or hydronephrosis visualized.  Abdominal aorta:  Normal.   2.1 cm maximum diameter.  Other findings:  Decreased velocity of portal vein flow suggesting portal hypertension.  There is a  moderate right pleural effusion.  IMPRESSION: 1. Hepatomegaly with the decrease portal vein flow, ascites and right pleural effusion. I suspect the patient has intrinsic liver disease. 2. Slight thickening of the gallbladder wall is probably due to low albumin or protein. I doubt that this represents acute acalculous cholecystitis.   Electronically Signed   By: Geanie Cooley M.D.   On: 05/30/2013 18:54     Date: 06/01/2013  Rate: 119  Rhythm: sinus tachycardia  QRS Axis: left  Intervals: normal  ST/T Wave abnormalities: nonspecific ST/T changes  Conduction Disutrbances:none  Narrative Interpretation:   Old EKG Reviewed: none available    MDM   Iv ns. Labs. Cxr.  Reviewed nursing notes and prior charts for additional history.   bnp markedly elevated, no prior to compare, vasc cong/chf on cxr.  Lasix iv.  No hx chf, bp normal, symptoms gradually progressive in past month, ?etiology - will need admit, further eval, possible echo, ct.   Med service called to admit.  Recheck hr 140, still sinus rhythm, no acute st/t changes on ecg.  Pt without acutely worsening dyspnea. No new symptoms or cp.   Discussed pt with Dr Mort Sawyers, admitting md.  Requests step down bed.  Also requests bipap and cta chest (ddimer now back elev).   Discussed plan w pt, pt agreeable.  CRITICAL CARE  New onset chf, dyspnea, tachycardia, elevated lfts, ?right heart failure r/o PE.  Performed by: Suzi Roots Total critical care time: 35 Critical care time was exclusive of separately billable procedures and treating other patients. Critical care was necessary to treat or prevent imminent or life-threatening deterioration. Critical care was time spent personally by me on the following activities: development of treatment plan with patient and/or surrogate as well as nursing, discussions  with consultants, evaluation of patient's response to treatment, examination of patient, obtaining history from patient or surrogate, ordering and performing treatments and interventions, ordering and review of laboratory studies, ordering and review of radiographic studies, pulse oximetry and re-evaluation of patient's condition.     Suzi Roots, MD 06/01/13 [phone]

## 2013-06-01 NOTE — Progress Notes (Signed)
Asked by nurse to check on patient.  Currently c/o mild sweating and SOB.  Been sick a while.  Had some NSVT at times since admit.    Lungs rales,  2+edema S3 gallop.   Will try some Xanax to help with anxiety  (lot of family in room)   and give additional furosemide to see if will diurese.   Note PT INR elevated.    If pressure becomes a problem may need milrinone or dobutamine.    Darden Palmer MD [facility]

## 2013-06-01 NOTE — Consult Note (Addendum)
CARDIOLOGY CONSULT NOTE   Patient ID: Troy NettersJonathan Roepke MRN: [id number], DOB/AGE: 08-30-1970   Admit date: 05/31/2013 Date of Consult: 06/01/2013   Primary Physician: Roxy MannsMarne Tower, MD Primary Cardiologist: None  Pt. Profile This previously healthy 43 year old gentleman presents to the hospital because of worsening dyspnea and evidence of fluid retention.  Problem List  Past Medical History  Diagnosis Date  . Hypertension   . Athlete's foot     History reviewed. No pertinent past surgical history.   Allergies  No Known Allergies  HPI  This patient does not have any prior cardiac history.  He was in his usual state of health until about the first week in January when he began to notice increasing fatigue weakness and shortness of breath.  This has progressed in a gradual fashion.  He was able to work until several days ago.  He has had orthopnea and paroxysmal nocturnal dyspnea and pedal edema.  He has not had any thromboembolic symptoms.  He has not had any chest pain.  He has had a sensation of abdominal fullness and he does have a past history of gastroesophageal reflux for which he takes omeprazole.  He has a remote history of high blood pressure diagnosed by his dentist but has never had to take any blood pressure medication and more recently his blood pressures have been normal.  There is no family history of familial cardiomyopathy.  His mother developed heart failure at age 41 and has responded well to conventional therapy. The patient denies any use of cocaine.  He has used marijuana in the past.  He has not used it recently.  Until the onset of these symptoms the patient used to drink between 2 and 8 beers each night.  He has not been drinking any alcohol recently. 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  There is also a left ventricular flat mural thrombus. The patient is an [employer] employee and  works with [employer].   Inpatient Medications  . aspirin EC  81 mg Oral Daily  . carvedilol  3.125 mg Oral BID WC  . enoxaparin (LOVENOX) injection  30 mg Subcutaneous Q24H  . furosemide  40 mg Intravenous Q12H  . [START ON 06/02/2013] influenza vac split quadrivalent PF  0.5 mL Intramuscular Tomorrow-1000  . magnesium sulfate 1 - 4 g bolus IVPB  2 g Intravenous Once  . pantoprazole  40 mg Oral Daily  . [START ON 06/02/2013] pneumococcal 23 valent vaccine  0.5 mL Intramuscular Tomorrow-1000  . potassium chloride  20 mEq Oral Daily  . sodium chloride  3 mL Intravenous Q12H    Family History Family History  Problem Relation Age of Onset  . Hypertension Mother   . Diabetes Mother   . Hypertension Maternal Grandmother   . Diabetes Maternal Grandmother   . Heart disease Maternal Grandmother      Social History History   Social History  . Marital Status: Married    Spouse Name: N/A    Number of Children: 0  . Years of Education: N/A   Occupational History  . Telephone    Social History Main Topics  . Smoking status: Never Smoker   . Smokeless tobacco: Not on file  . Alcohol Use: Yes     Comment: rare  . Drug Use: Not on file  . Sexual Activity: Not on file   Other Topics Concern  . Not on file   Social History  Narrative  . No narrative on file     Review of Systems  General:  No chills, fever, night sweats .  He has had weight gain secondary to fluid retention  Cardiovascular:  No chest pain.,  positive for orthopnea and paroxysmal nocturnal dyspnea and peripheral edema. Dermatological: No rash, lesions/masses Respiratory: No cough, dyspnea Urologic: No hematuria, dysuria Abdominal:   No nausea, vomiting, diarrhea, bright red blood per rectum, melena, or hematemesis Neurologic:  No visual changes, wkns, changes in mental status. All other systems reviewed and are otherwise negative except as noted above.  Physical Exam  Blood pressure 81/64, pulse  108, temperature 98.3 F (36.8 C), temperature source Oral, resp. rate 28, height 6' (1.829 m), weight 228 lb 9.9 oz (103.7 kg), SpO2 97.00%.  General: Pleasant, NAD Psych: Normal affect. Neuro: Alert and oriented X 3. Moves all extremities spontaneously. HEENT: Normal  Neck: Supple without bruits or JVD. Lungs: There are bibasilar inspiratory rales Heart: RRR.  There is an S3 gallop Abdomen: Soft, non-tender, non-distended, BS + x 4.  Extremities: There is 2+ pretibial and ankle edema. Labs   Recent Labs  06/01/13 0520 06/01/13 0948  TROPONINI <0.30 <0.30   Lab Results  Component Value Date   WBC 10.8* 05/31/2013   HGB 17.3* 05/31/2013   HCT 51.0 05/31/2013   MCV 90.6 05/31/2013   PLT 249 05/31/2013     Recent Labs Lab 06/01/13 0029  NA 132*  K 4.6  CL 95*  CO2 20  BUN 28*  CREATININE 1.39*  CALCIUM 9.1  PROT 5.9*  BILITOT 1.7*  ALKPHOS 82  ALT 484*  AST 351*  GLUCOSE 111*   Lab Results  Component Value Date   CHOL 70 06/01/2013   HDL 8* 06/01/2013   LDLCALC 47 06/01/2013   TRIG 75 06/01/2013   Lab Results  Component Value Date   DDIMER 2.55* 06/01/2013    Radiology/Studies  Dg Chest 2 View  06/01/2013   CLINICAL DATA:  Chest pain  EXAM: CHEST  2 VIEW  COMPARISON:  None.  FINDINGS: Cardiac enlargement. Small pleural effusions bilaterally. Possible interstitial edema. Mild vascular congestion. Negative for pneumonia.  IMPRESSION: Mild fluid overload   Electronically Signed   By: Marlan Palau M.D.   On: 06/01/2013 01:51   Dg Abd 1 View  05/30/2013   CLINICAL DATA:  Vomiting.  Abdominal pain and bloating.  Cramping.  EXAM: ABDOMEN - 1 VIEW  COMPARISON:  None.  FINDINGS: There is air scattered throughout nondistended loops of large or small bowel. No visible free air or free fluid on the supine radiograph. Osseous structures are normal. No worrisome abdominal calcifications.  IMPRESSION: Benign appearing abdomen.   Electronically Signed   By: Geanie Cooley M.D.    On: 05/30/2013 13:54   Ct Angio Chest Pe W/cm &/or Wo Cm  06/01/2013   CLINICAL DATA:  Shortness of breath with exertion, pitting edema.  EXAM: CT ANGIOGRAPHY CHEST WITH CONTRAST  TECHNIQUE: Multidetector CT imaging of the chest was performed using the standard protocol during bolus administration of intravenous contrast. Multiplanar CT image reconstructions and MIPs were obtained to evaluate the vascular anatomy.  CONTRAST:  OMNIPAQUE IOHEXOL 350 MG/ML SOLN  COMPARISON:  DG CHEST 2 VIEW dated 06/01/2013  FINDINGS: Main pulmonary artery is not enlarged. No pulmonary arterial filling defects to the level of the subsegmental branches.  Heart is mildly enlarged, pericardium is unremarkable. Thoracic aorta is normal in course and caliber, unremarkable though not opacified.  Small to moderate right and small left pleural effusion. Patchy ground-glass opacities, heterogeneous lung parenchyma. Patchy areas of tree-in-bud infiltrate throughout all lobes of the lungs. Tracheobronchial tree is patent and midline with mild wall thickening. Linear density through the lower trachea may reflect secretions, axial 27 and 95.  Sub cm prevascular/pretracheal lymph nodes, in addition to precarinal 12 mm short axis lymph node and somewhat prominent right hilar lymph nodes. Thoracic esophagus is unremarkable.  Mild ascites within the imaged abdomen. Osseous structures are nonsuspicious. Very mild wedging of vertebral body T12 likely reflects congenital variant.  Review of the MIP images confirms the above findings.  IMPRESSION: No pulmonary embolism.  Mild cardiomegaly with small to moderate right and small left pleural effusions. Heterogeneous lung attenuation may reflect small airway disease/ pulmonary edema. However, there is mild bronchial wall thickening which can be seen with pulmonary edema or bronchitis in addition to tree-in-bud airspace opacities throughout the lungs suggesting infectious or inflammatory process.  Mild  ascites within the included abdomen.   Electronically Signed   By: Awilda Metro   On: 06/01/2013 04:17   US Abdomen Complete  05/30/2013   CLINICAL DATA:  Abdominal pain and vomiting.  EXAM: ULTRASOUND ABDOMEN COMPLETE  COMPARISON:  None.  FINDINGS: Gallbladder:  Gallbladder wall is thickened at 3.8 mm. No gallstones or sludge. Negative sonographic Murphy's sign.  Common bile duct:  Diameter: 3.6 mm, normal.  Liver:  Hepatomegaly. Small amount of ascites around the liver. No focal liver lesions. No dilated bile ducts.  IVC:  No abnormality visualized.  Pancreas:  The body of the pancreas is normal. The head and extreme tail of the pancreas are obscured.  Spleen:  Normal.  10.6 cm in length.  Right Kidney:  Length: 10.4 cm. Echogenicity within normal limits. No mass or hydronephrosis visualized.  Left Kidney:  Length: 12.5 cm. Echogenicity within normal limits. No mass or hydronephrosis visualized.  Abdominal aorta:  Normal.  2.1 cm maximum diameter.  Other findings:  Decreased velocity of portal vein flow suggesting portal hypertension.  There is a  moderate right pleural effusion.  IMPRESSION: 1. Hepatomegaly with the decrease portal vein flow, ascites and right pleural effusion. I suspect the patient has intrinsic liver disease. 2. Slight thickening of the gallbladder wall is probably due to low albumin or protein. I doubt that this represents acute acalculous cholecystitis.   Electronically Signed   By: Geanie Cooley M.D.   On: 05/30/2013 18:54    ECG  There is sinus tachycardia.  There is low voltage.  There is poor R-wave progression across precordium.  2-D echo Severe dilated left ventricle with ejection fraction of 10%.  There is a left ventricular apical mural thrombus. ASSESSMENT AND PLAN  1. severe dilated cardiomyopathy undetermined etiology.  No history of any recall of antecedent viral infection prior to the onset of his symptoms 2 months ago.  Moderate but not extreme alcohol intake.   No history of familial cardiomyopathy.  History does not suggest ischemic heart disease as etiology.Will check for hemochromotosis. 2. left ventricular mural thrombus 3. elevated hepatic enzymes probably secondary to congestive heart failure with hepatic congestion. 4. mild renal insufficiency 5. history of GERD Plan: We will transfer him to [place] now.  We will take him onto our service at [place]. [facility] plan to have the advanced heart failure team see the patient.  He will need right and left heart cardiac catheterization at some point.  We will start IV heparin for  left ventricular mural thrombus.  His blood pressure is soft which limits aggressive diuresis.  We will continue low-dose carvedilol at this point.  We will plan to add an ACE inhibitor once his blood pressure improves.  Signed, Cassell Clement, MD  06/01/2013, 3:38 PM

## 2013-06-01 NOTE — Progress Notes (Addendum)
[facility] PROGRESS NOTE  [name] ZOX:[id number] DOB: 04-25-1970 DOA: 05/31/2013 PCP: Roxy Manns, MD  Assessment/Plan: Acute CHF, type unknown -Expedite 2D ECHO; suspect systolic CHF. -He has none of the typical risk factors: HTN, DM,hyperlipidemia, smoker, family history. Does occasional ETOH; maybe 2 beers 3-4 nights/week. -EKG with anterior Q waves (?old MI). -Has been started on Coreg. -Consider cards consult for further evaluation pending results of 2 D ECHO. -Continue lasix; he is symptomatically improved. -Almost 2 L negative since admission. -Strive for negative fluid balance.  NSVT -Several episodes. -Would correlate with a decreased EF. -Check Mg, TSH, cycle troponins. -K ok. -Coreg has been started.  ARF -Cr down to 1.39 from 1.5 on admission. -Recheck renal function in am.  Transaminitis -Likely related to hepatic congestion from CHF. -Will, nonetheless check an abdominal US and a hepatic panel.  Code Status: Full Code Family Communication: Wife at bedside updated on plan of care.  Disposition Plan: To tele.   Consultants:  None   Antibiotics:  None   Subjective: Less SOB today. Feels better with of bed elevated.  Objective: Filed Vitals:   06/01/13 0745 06/01/13 0800 06/01/13 0801 06/01/13 0911  BP: 104/82  106/71 103/80  Pulse: 106   111  Temp:  98.3 F (36.8 C)    TempSrc:  Oral    Resp: 36  34   Height:      Weight:      SpO2: 96%  97%     Intake/Output Summary (Last 24 hours) at 06/01/13 0936 Last data filed at 06/01/13 0500  Gross per 24 hour  Intake      3 ml  Output   1950 ml  Net  -1947 ml   Filed Weights   05/31/13 2318 06/01/13 0359  Weight: 105.688 kg (233 lb) 103.7 kg (228 lb 9.9 oz)    Exam:   General:  AA Ox3  Cardiovascular: RRR, no M/R/G  Respiratory: Bilateral crackles, worse at bases.  Abdomen: S/NT/ND/+BS  Extremities: 2-3+ edema up to knees.   Neurologic:  Intact and  non-focal.  Data Reviewed: Basic Metabolic Panel:  Recent Labs Lab 05/30/13 1151 05/31/13 2358 06/01/13 0029  NA 134* 135* 132*  K 4.6 4.3 4.6  CL 101 101 95*  CO2 24  --  20  GLUCOSE 110* 110* 111*  BUN 18 28* 28*  CREATININE 1.5 1.50* 1.39*  CALCIUM 8.9  --  9.1   Liver Function Tests:  Recent Labs Lab 05/30/13 1151 06/01/13 0029  AST 98* 351*  ALT 157* 484*  ALKPHOS 67 82  BILITOT 2.1* 1.7*  PROT 5.9* 5.9*  ALBUMIN 3.6 3.3*    Recent Labs Lab 05/30/13 1151  LIPASE 44.0   No results found for this basename: AMMONIA,  in the last 168 hours CBC:  Recent Labs Lab 05/30/13 1151 05/31/13 2353 05/31/13 2358  WBC 11.8* 10.8*  --   NEUTROABS  --  7.9*  --   HGB 15.6 15.7 17.3*  HCT 47.6 47.2 51.0  MCV 91.8 90.6  --   PLT 248.0 249  --    Cardiac Enzymes:  Recent Labs Lab 06/01/13 0520  TROPONINI <0.30   BNP (last 3 results)  Recent Labs  06/01/13 0029  PROBNP 6513.0*   CBG: No results found for this basename: GLUCAP,  in the last 168 hours  No results found for this or any previous visit (from the past 240 hour(s)).   Studies: Dg Chest 2 View  06/01/2013  CLINICAL DATA:  Chest pain  EXAM: CHEST  2 VIEW  COMPARISON:  None.  FINDINGS: Cardiac enlargement. Small pleural effusions bilaterally. Possible interstitial edema. Mild vascular congestion. Negative for pneumonia.  IMPRESSION: Mild fluid overload   Electronically Signed   By: Marlan Palau M.D.   On: 06/01/2013 01:51   Dg Abd 1 View  05/30/2013   CLINICAL DATA:  Vomiting.  Abdominal pain and bloating.  Cramping.  EXAM: ABDOMEN - 1 VIEW  COMPARISON:  None.  FINDINGS: There is air scattered throughout nondistended loops of large or small bowel. No visible free air or free fluid on the supine radiograph. Osseous structures are normal. No worrisome abdominal calcifications.  IMPRESSION: Benign appearing abdomen.   Electronically Signed   By: Geanie Cooley M.D.   On: 05/30/2013 13:54   Ct Angio  Chest Pe W/cm &/or Wo Cm  06/01/2013   CLINICAL DATA:  Shortness of breath with exertion, pitting edema.  EXAM: CT ANGIOGRAPHY CHEST WITH CONTRAST  TECHNIQUE: Multidetector CT imaging of the chest was performed using the standard protocol during bolus administration of intravenous contrast. Multiplanar CT image reconstructions and MIPs were obtained to evaluate the vascular anatomy.  CONTRAST:  OMNIPAQUE IOHEXOL 350 MG/ML SOLN  COMPARISON:  DG CHEST 2 VIEW dated 06/01/2013  FINDINGS: Main pulmonary artery is not enlarged. No pulmonary arterial filling defects to the level of the subsegmental branches.  Heart is mildly enlarged, pericardium is unremarkable. Thoracic aorta is normal in course and caliber, unremarkable though not opacified.  Small to moderate right and small left pleural effusion. Patchy ground-glass opacities, heterogeneous lung parenchyma. Patchy areas of tree-in-bud infiltrate throughout all lobes of the lungs. Tracheobronchial tree is patent and midline with mild wall thickening. Linear density through the lower trachea may reflect secretions, axial 27 and 95.  Sub cm prevascular/pretracheal lymph nodes, in addition to precarinal 12 mm short axis lymph node and somewhat prominent right hilar lymph nodes. Thoracic esophagus is unremarkable.  Mild ascites within the imaged abdomen. Osseous structures are nonsuspicious. Very mild wedging of vertebral body T12 likely reflects congenital variant.  Review of the MIP images confirms the above findings.  IMPRESSION: No pulmonary embolism.  Mild cardiomegaly with small to moderate right and small left pleural effusions. Heterogeneous lung attenuation may reflect small airway disease/ pulmonary edema. However, there is mild bronchial wall thickening which can be seen with pulmonary edema or bronchitis in addition to tree-in-bud airspace opacities throughout the lungs suggesting infectious or inflammatory process.  Mild ascites within the included  abdomen.   Electronically Signed   By: Awilda Metro   On: 06/01/2013 04:17   US Abdomen Complete  05/30/2013   CLINICAL DATA:  Abdominal pain and vomiting.  EXAM: ULTRASOUND ABDOMEN COMPLETE  COMPARISON:  None.  FINDINGS: Gallbladder:  Gallbladder wall is thickened at 3.8 mm. No gallstones or sludge. Negative sonographic Murphy's sign.  Common bile duct:  Diameter: 3.6 mm, normal.  Liver:  Hepatomegaly. Small amount of ascites around the liver. No focal liver lesions. No dilated bile ducts.  IVC:  No abnormality visualized.  Pancreas:  The body of the pancreas is normal. The head and extreme tail of the pancreas are obscured.  Spleen:  Normal.  10.6 cm in length.  Right Kidney:  Length: 10.4 cm. Echogenicity within normal limits. No mass or hydronephrosis visualized.  Left Kidney:  Length: 12.5 cm. Echogenicity within normal limits. No mass or hydronephrosis visualized.  Abdominal aorta:  Normal.  2.1 cm maximum diameter.  Other findings:  Decreased velocity of portal vein flow suggesting portal hypertension.  There is a  moderate right pleural effusion.  IMPRESSION: 1. Hepatomegaly with the decrease portal vein flow, ascites and right pleural effusion. I suspect the patient has intrinsic liver disease. 2. Slight thickening of the gallbladder wall is probably due to low albumin or protein. I doubt that this represents acute acalculous cholecystitis.   Electronically Signed   By: Geanie CooleyJim  Maxwell M.D.   On: 05/30/2013 18:54    Scheduled Meds: . carvedilol  3.125 mg Oral BID WC  . enoxaparin (LOVENOX) injection  30 mg Subcutaneous Q24H  . furosemide  40 mg Intravenous Q12H  . pantoprazole  40 mg Oral Daily  . potassium chloride  10 mEq Oral Daily  . sodium chloride  3 mL Intravenous Q12H   Continuous Infusions:   Principal Problem:   Acute CHF Active Problems:   SOB (shortness of breath)   HYPERLIPIDEMIA   SLEEP APNEA   Elevated transaminase level   Elevated d-dimer   NSVT (nonsustained  ventricular tachycardia)    Time spent: 35 minutes. Greater than 50% of this time was spent in direct contact with the patient coordinating care.    Chaya JanHERNANDEZ ACOSTA,ESTELA  [facility] Pager [phone]  If 7PM-7AM, please contact night-coverage at [email or web address], password [email or web address] 06/01/2013, 9:36 AM  LOS: 1 day

## 2013-06-02 ENCOUNTER — Inpatient Hospital Stay (HOSPITAL_COMMUNITY): Payer: BC Managed Care – PPO

## 2013-06-02 ENCOUNTER — Telehealth: Payer: Self-pay | Admitting: Family Medicine

## 2013-06-02 DIAGNOSIS — R570 Cardiogenic shock: Secondary | ICD-10-CM

## 2013-06-02 DIAGNOSIS — I5021 Acute systolic (congestive) heart failure: Secondary | ICD-10-CM

## 2013-06-02 DIAGNOSIS — I959 Hypotension, unspecified: Secondary | ICD-10-CM

## 2013-06-02 DIAGNOSIS — K7200 Acute and subacute hepatic failure without coma: Secondary | ICD-10-CM

## 2013-06-02 DIAGNOSIS — F411 Generalized anxiety disorder: Secondary | ICD-10-CM

## 2013-06-02 LAB — COMPREHENSIVE METABOLIC PANEL
ALT: 2138 U/L — ABNORMAL HIGH (ref 0–53)
AST: 2636 U/L — ABNORMAL HIGH (ref 0–37)
Albumin: 3 g/dL — ABNORMAL LOW (ref 3.5–5.2)
Alkaline Phosphatase: 114 U/L (ref 39–117)
BUN: 37 mg/dL — ABNORMAL HIGH (ref 6–23)
CO2: 21 meq/L (ref 19–32)
Calcium: 8.3 mg/dL — ABNORMAL LOW (ref 8.4–10.5)
Chloride: 99 mEq/L (ref 96–112)
Creatinine, Ser: 1.63 mg/dL — ABNORMAL HIGH (ref 0.50–1.35)
GFR, EST AFRICAN AMERICAN: 59 mL/min — AB (ref >90)
GFR, EST NON AFRICAN AMERICAN: 51 mL/min — AB (ref >90)
GLUCOSE: 123 mg/dL — AB (ref 70–99)
Potassium: 4.4 mEq/L (ref 3.7–5.3)
Sodium: 137 mEq/L (ref 137–147)
Total Bilirubin: 2.2 mg/dL — ABNORMAL HIGH (ref 0.3–1.2)
Total Protein: 5.2 g/dL — ABNORMAL LOW (ref 6.0–8.3)

## 2013-06-02 LAB — CBC
HCT: 43.9 % (ref 39.0–52.0)
HCT: 48 % (ref 39.0–52.0)
HEMOGLOBIN: 15 g/dL (ref 13.0–17.0)
Hemoglobin: 16.6 g/dL (ref 13.0–17.0)
MCH: 30.5 pg (ref 26.0–34.0)
MCH: 31.2 pg (ref 26.0–34.0)
MCHC: 34.2 g/dL (ref 30.0–36.0)
MCHC: 34.6 g/dL (ref 30.0–36.0)
MCV: 89.4 fL (ref 78.0–100.0)
MCV: 90.2 fL (ref 78.0–100.0)
PLATELETS: 228 10*3/uL (ref 150–400)
Platelets: 198 10*3/uL (ref 150–400)
RBC: 4.91 MIL/uL (ref 4.22–5.81)
RBC: 5.32 MIL/uL (ref 4.22–5.81)
RDW: 13.4 % (ref 11.5–15.5)
RDW: 13.4 % (ref 11.5–15.5)
WBC: 12.5 10*3/uL — ABNORMAL HIGH (ref 4.0–10.5)
WBC: 12.5 10*3/uL — ABNORMAL HIGH (ref 4.0–10.5)

## 2013-06-02 LAB — CARBOXYHEMOGLOBIN
CARBOXYHEMOGLOBIN: 1.4 % (ref 0.5–1.5)
CARBOXYHEMOGLOBIN: 1.4 % (ref 0.5–1.5)
Carboxyhemoglobin: 1 % (ref 0.5–1.5)
Carboxyhemoglobin: 1.3 % (ref 0.5–1.5)
METHEMOGLOBIN: 1.4 % (ref 0.0–1.5)
Methemoglobin: 0.5 % (ref 0.0–1.5)
Methemoglobin: 0.7 % (ref 0.0–1.5)
Methemoglobin: 0.7 % (ref 0.0–1.5)
O2 SAT: 72.3 %
O2 SAT: 39 %
O2 SAT: 45.2 %
O2 Saturation: 64 %
Total hemoglobin: 14.3 g/dL (ref 13.5–18.0)
Total hemoglobin: 12.2 g/dL — ABNORMAL LOW (ref 13.5–18.0)
Total hemoglobin: 15.2 g/dL (ref 13.5–18.0)
Total hemoglobin: 14.2 g/dL (ref 13.5–18.0)

## 2013-06-02 LAB — IRON AND TIBC
Iron: 22 ug/dL — ABNORMAL LOW (ref 42–135)
Saturation Ratios: 9 % — ABNORMAL LOW (ref 20–55)
TIBC: 240 ug/dL (ref 215–435)
UIBC: 218 ug/dL (ref 125–400)

## 2013-06-02 LAB — HEPARIN LEVEL (UNFRACTIONATED)
HEPARIN UNFRACTIONATED: 0.14 [IU]/mL — AB (ref 0.30–0.70)
Heparin Unfractionated: 0.22 IU/mL — ABNORMAL LOW (ref 0.30–0.70)
Heparin Unfractionated: 0.38 IU/mL (ref 0.30–0.70)

## 2013-06-02 LAB — FERRITIN: Ferritin: 2660 ng/mL — ABNORMAL HIGH (ref 22–322)

## 2013-06-02 MED ORDER — MIDAZOLAM HCL 2 MG/2ML IJ SOLN
1.0000 mg | Freq: Once | INTRAMUSCULAR | Status: AC
  Administered 2013-06-02: 1 mg via INTRAVENOUS

## 2013-06-02 MED ORDER — MILRINONE IN DEXTROSE 20 MG/100ML IV SOLN
0.2500 ug/kg/min | INTRAVENOUS | Status: DC
  Administered 2013-06-02 – 2013-06-03 (×3): 0.375 ug/kg/min via INTRAVENOUS
  Administered 2013-06-03 – 2013-06-06 (×6): 0.25 ug/kg/min via INTRAVENOUS
  Filled 2013-06-02 (×8): qty 100

## 2013-06-02 MED ORDER — FENTANYL CITRATE 0.05 MG/ML IJ SOLN
25.0000 ug | Freq: Once | INTRAMUSCULAR | Status: AC
  Administered 2013-06-02: 25 ug via INTRAVENOUS

## 2013-06-02 MED ORDER — DIGOXIN 250 MCG PO TABS
0.2500 mg | ORAL_TABLET | Freq: Every day | ORAL | Status: DC
  Administered 2013-06-02 – 2013-06-09 (×8): 0.25 mg via ORAL
  Filled 2013-06-02 (×9): qty 1

## 2013-06-02 MED ORDER — HEPARIN (PORCINE) IN NACL 100-0.45 UNIT/ML-% IJ SOLN
2550.0000 [IU]/h | INTRAMUSCULAR | Status: DC
  Administered 2013-06-02: 2100 [IU]/h via INTRAVENOUS
  Administered 2013-06-02 (×2): 1850 [IU]/h via INTRAVENOUS
  Administered 2013-06-03 (×2): 2500 [IU]/h via INTRAVENOUS
  Administered 2013-06-04 – 2013-06-06 (×6): 2800 [IU]/h via INTRAVENOUS
  Administered 2013-06-06 – 2013-06-08 (×5): 2550 [IU]/h via INTRAVENOUS
  Filled 2013-06-02 (×28): qty 250

## 2013-06-02 MED ORDER — FENTANYL CITRATE 0.05 MG/ML IJ SOLN
INTRAMUSCULAR | Status: AC
  Filled 2013-06-02: qty 2

## 2013-06-02 MED ORDER — MIDAZOLAM HCL 2 MG/2ML IJ SOLN
INTRAMUSCULAR | Status: AC
  Administered 2013-06-02: 2 mg
  Filled 2013-06-02: qty 2

## 2013-06-02 NOTE — Progress Notes (Signed)
  Initial co-ox = 72%  Swan numbers done personally at bedside with nurse  CVP 19 PA 41/25 (33) PCWP 29 Thermo CO/CI 2.4/1.1  Given shock liver and kidney suspect thermodilution numbers are more accurate. Will repeat PA sat and also check SVC sat from sheath to re-evaluate and also assess for shunt.   Once co-ox drawn start milrinone at 0.386mcg/kg/min  Truman Hayward 2:18 PM

## 2013-06-02 NOTE — Progress Notes (Signed)
CRITICAL VALUE ALERT  Critical value received:  PTT >200  Date of notification:  06/01/13  Time of notification:  2215  Critical value read back:yes  Nurse who received alert:  [place]   MD notified (1st page):  Donnie Aho  Time of first page:  2220  MD notified (2nd page):  Time of second page:  Responding MD:  Donnie Aho  Time MD responded:  2220

## 2013-06-02 NOTE — Telephone Encounter (Signed)
Pt sent my chart message this morning about getting into the office because of some results he received.  Preferred Times: Monday Morning, Monday Afternoon Reason for visit: Office Visit Comments: I need an appt on Monday with any available MD. My ultrasound results are very alarming and I want to get more information and answers ASAP.  Please advise.

## 2013-06-02 NOTE — Progress Notes (Signed)
ANTICOAGULATION CONSULT NOTE - Follow Up Consult  Pharmacy Consult for heparin Indication: mural thrombus  No Known Allergies  Patient Measurements: Height: 6' (182.9 cm) Weight: 226 lb 13.7 oz (102.9 kg) IBW/kg (Calculated) : 77.6 Heparin Dosing Weight: 99kg  Vital Signs: Temp: 100.4 F (38 C) (03/16 2000) BP: 99/65 mmHg (03/16 2000) Pulse Rate: 95 (03/16 2000)  Labs:  Recent Labs  05/31/13 2353 05/31/13 2358 06/01/13 0008 06/01/13 0029 06/01/13 0520 06/01/13 0948 06/01/13 1530 06/01/13 1619 06/01/13 1918 06/02/13 0650 06/02/13 2000  HGB 15.7 17.3* 16.6  --   --   --   --   --   --  15.0  --   HCT 47.2 51.0 48.0  --   --   --   --   --   --  43.9  --   PLT 249  --  228  --   --   --   --   --   --  198  --   APTT  --   --   --   --   --   --   --  32 >200*  --   --   LABPROT  --   --   --   --   --   --   --  21.6* 34.6*  --   --   INR  --   --   --   --   --   --   --  1.94* 3.61*  --   --   HEPARINUNFRC  --   --  0.38  --   --   --   --   --   --  0.22* 0.14*  CREATININE  --  1.50*  --  1.39*  --   --   --   --   --  1.63*  --   TROPONINI  --   --   --   --  <0.30 <0.30 <0.30  --   --   --   --     Estimated Creatinine Clearance: 74.7 ml/min (by C-G formula based on Cr of 1.63).  Assessment: 42 yom continues on IV heparin for anticoagulation or mural thrombus. Heparin level has decreased further to 0.14 despite rate increase. RN stated there have been no problems with the line but pt has been coughing up some bloody mucus throughout the day (MD is aware).   Goal of Therapy:  Heparin level 0.3-0.5 units/ml Monitor platelets by anticoagulation protocol: Yes   Plan:  1. Increase heparin gtt conservatively to 2100 units/hr (no bolus d/t coughing up blood) 2. Check an 8 hour heparin level 3. Continue daily heparin level and CBC  Zafirah Vanzee, Drake Leach 06/02/2013,8:55 PM

## 2013-06-02 NOTE — Telephone Encounter (Signed)
Left voicemail requesting pt to call office 

## 2013-06-02 NOTE — Progress Notes (Signed)
   Patient seen and examined. Full HF consult pending.   Patient with evidence of cardiogenic shock and reduced end-organ perfusion with shock liver/kidney.   Will need PA catheter this am with inotropic initiation. Possible need for mechanical support discussed with him and his family.   [name],MD 10:20 AM

## 2013-06-02 NOTE — Progress Notes (Signed)
Pt. Was placed on CPAP auto titrate (min: 4, max: 18) via nasal mask with 2L O2 bled in. Pt. Is tolerating CPAP well at this time without any complications.   

## 2013-06-02 NOTE — Progress Notes (Addendum)
Advanced HF Team  Asked to see patient for low output HF.    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 Admitted with several week h/o HF symptoms. Denies strong FHx of CHF. No viral prodrome.   Echo reviewed personally and shoes LVEF 10% with global HK, large LV thrombus and severe RV dysfunction.   This am says he feels ok. SBP 88-100. Still making urine. However LFT and creatinine has risen dramatically. Troponins remain negative.   On exam he is alert and conversant. JVP to jaw Cor PMI laterally displaced +s3 Ab distended. Nontender Ext. Warm 2+ edema  Results for orders placed during the hospital encounter of 05/31/13 (from the past 24 hour(s))  TROPONIN I     Status: None   Collection Time    06/01/13  3:30 PM      Result Value Ref Range   Troponin I <0.30  <0.30 ng/mL  PROTIME-INR     Status: Abnormal   Collection Time    06/01/13  4:19 PM      Result Value Ref Range   Prothrombin Time 21.6 (*) 11.6 - 15.2 seconds   INR 1.94 (*) 0.00 - 1.49  APTT     Status: None   Collection Time    06/01/13  4:19 PM      Result Value Ref Range   aPTT 32  24 - 37 seconds  APTT     Status: Abnormal   Collection Time    06/01/13  7:18 PM      Result Value Ref Range   aPTT >200 (*) 24 - 37 seconds  PROTIME-INR     Status: Abnormal   Collection Time    06/01/13  7:18 PM      Result Value Ref Range   Prothrombin Time 34.6 (*) 11.6 - 15.2 seconds   INR 3.61 (*) 0.00 - 1.49  URINE RAPID DRUG SCREEN (HOSP PERFORMED)     Status: Abnormal   Collection Time    06/01/13  7:53 PM      Result Value Ref Range   Opiates NONE DETECTED  NONE DETECTED   Cocaine NONE DETECTED  NONE DETECTED   Benzodiazepines NONE DETECTED  NONE DETECTED   Amphetamines NONE DETECTED  NONE DETECTED   Tetrahydrocannabinol POSITIVE (*) NONE DETECTED   Barbiturates NONE DETECTED  NONE DETECTED  CBC     Status: Abnormal   Collection Time    06/02/13   6:50 AM      Result Value Ref Range   WBC 12.5 (*) 4.0 - 10.5 K/uL   RBC 4.91  4.22 - 5.81 MIL/uL   Hemoglobin 15.0  13.0 - 17.0 g/dL   HCT 09.843.9  11.939.0 - 14.752.0 %   MCV 89.4  78.0 - 100.0 fL   MCH 30.5  26.0 - 34.0 pg   MCHC 34.2  30.0 - 36.0 g/dL   RDW 82.913.4  56.211.5 - 13.015.5 %   Platelets 198  150 - 400 K/uL  COMPREHENSIVE METABOLIC PANEL     Status: Abnormal   Collection Time    06/02/13  6:50 AM      Result Value Ref Range   Sodium 137  137 - 147 mEq/L   Potassium 4.4  3.7 - 5.3 mEq/L   Chloride 99  96 - 112 mEq/L   CO2 21  19 - 32 mEq/L   Glucose, Bld 123 (*) 70 - 99 mg/dL  BUN 37 (*) 6 - 23 mg/dL   Creatinine, Ser 6.38 (*) 0.50 - 1.35 mg/dL   Calcium 8.3 (*) 8.4 - 10.5 mg/dL   Total Protein 5.2 (*) 6.0 - 8.3 g/dL   Albumin 3.0 (*) 3.5 - 5.2 g/dL   AST 4536 (*) 0 - 37 U/L   ALT 2138 (*) 0 - 53 U/L   Alkaline Phosphatase 114  39 - 117 U/L   Total Bilirubin 2.2 (*) 0.3 - 1.2 mg/dL   GFR calc non Af Amer 51 (*) >90 mL/min   GFR calc Af Amer 59 (*) >90 mL/min  HEPARIN LEVEL (UNFRACTIONATED)     Status: Abnormal   Collection Time    06/02/13  6:50 AM      Result Value Ref Range   Heparin Unfractionated 0.22 (*) 0.30 - 0.70 IU/mL  FERRITIN     Status: Abnormal   Collection Time    06/02/13  6:50 AM      Result Value Ref Range   Ferritin 2660 (*) 22 - 322 ng/mL  IRON AND TIBC     Status: Abnormal   Collection Time    06/02/13  6:50 AM      Result Value Ref Range   Iron 22 (*) 42 - 135 ug/dL   TIBC 468  032 - 122 ug/dL   Saturation Ratios 9 (*) 20 - 55 %   UIBC 218  125 - 400 ug/dL  CARBOXYHEMOGLOBIN     Status: None   Collection Time    06/02/13 11:25 AM      Result Value Ref Range   Total hemoglobin 14.3  13.5 - 18.0 g/dL   O2 Saturation 48.2     Carboxyhemoglobin 1.4  0.5 - 1.5 %   Methemoglobin 0.5  0.0 - 1.5 %   ECG. Stach 119. Low volts  Poor RWP  Impression:  1. Acute systolic HF with likely low-output physiology = cardiogenic shock 2. Shock kidney and  liver    --acute renal and hepatic failure likely due to #1 3. ETOH use 4. Obesity  Plan/discussion:  Suspect he has profound cardiogenic shock due to NICM now with end-organ dysfunction. Will need PA catheter and probable inotropic (if not mechanical support). Suspect NICM may be due to ETOH.  Long talk with patient and family about potential for continued deterioration as well as various therapies available to him. Further plan based on hemodynamics. Continue heparin for LV clot.   The patient is critically ill with multiple organ systems failure and requires high complexity decision making for assessment and support, frequent evaluation and titration of therapies, application of advanced monitoring technologies and extensive interpretation of multiple databases.   Critical Care Time devoted to patient care services described in this note is 60 Minutes.  Truman Hayward 2:32 PM

## 2013-06-02 NOTE — Procedures (Signed)
Pulmonary Artery Catheter Insertion Procedure Note [name] [id number] Nov 17, 1970  Procedure: Insertion of Pulmonary Artery Catheter Indications: Hemodynamic monitoring  Procedure Details Consent: Risks of procedure as well as the alternatives and risks of each were explained to the (patient/caregiver).  Consent for procedure obtained. Time Out: Verified patient identification, verified procedure, site/side was marked, verified correct patient position, special equipment/implants available, medications/allergies/relevent history reviewed, required imaging and test results available.  Performed  Maximum sterile technique was used including antiseptics, cap, gloves, gown, hand hygiene, mask and sheet. Skin prep: Chlorhexidine; local anesthetic administered A 7FR sheath was placed in the right internal jugular vein using a modified Seldinger technique. A swan Ganz catheter was placed through the sheath and navigated into the pulmonary artery.   Evaluation Blood flow good Complications: No apparent complications Patient did tolerate procedure well. Chest X-ray ordered to verify placement.  CXR: pending.  [name] 06/02/2013, 11:13 AM

## 2013-06-02 NOTE — Progress Notes (Addendum)
       Patient Name: [name] Date of Encounter: 06/02/2013    SUBJECTIVE: Breathing is improved. He denies chest pain. Daily alcohol use until recently. 4 week history of progressive dyspnea and swelling.  TELEMETRY:   NSR and sinus tach Filed Vitals:   06/02/13 0414 06/02/13 0500 06/02/13 0600 06/02/13 0700  BP:  89/63 88/71 92/73   Pulse:  82 87 92  Temp:      TempSrc:      Resp:  29 24 31   Height:      Weight: 226 lb 13.7 oz (102.9 kg)     SpO2:  94% 98% 96%    Intake/Output Summary (Last 24 hours) at 06/02/13 0755 Last data filed at 06/02/13 0700  Gross per 24 hour  Intake 908.41 ml  Output   1825 ml  Net -916.59 ml    LABS: Basic Metabolic Panel:  Recent Labs  37/85/88 0029 06/01/13 0948 06/02/13 0650  NA 132*  --  137  K 4.6  --  4.4  CL 95*  --  99  CO2 20  --  21  GLUCOSE 111*  --  123*  BUN 28*  --  37*  CREATININE 1.39*  --  1.63*  CALCIUM 9.1  --  8.3*  MG  --  1.8  --    CBC:  Recent Labs  05/31/13 2353  06/01/13 0008 06/02/13 0650  WBC 10.8*  --  12.5* 12.5*  NEUTROABS 7.9*  --   --   --   HGB 15.7  < > 16.6 15.0  HCT 47.2  < > 48.0 43.9  MCV 90.6  --  90.2 89.4  PLT 249  --  228 198  < > = values in this interval not displayed. Cardiac Enzymes:  Recent Labs  06/01/13 0520 06/01/13 0948 06/01/13 1530  TROPONINI <0.30 <0.30 <0.30   Radiology/Studies:  CHF  Physical Exam: Blood pressure 92/73, pulse 92, temperature 97.5 F (36.4 C), temperature source Oral, resp. rate 31, height 6' (1.829 m), weight 226 lb 13.7 oz (102.9 kg), SpO2 96.00%. Weight change: -6 lb 2.4 oz (-2.788 kg)   Decreased breath sounds. Cardiac with decreased breath sounds and ? S3 Difficult to assess neck veins, but felt to be elevated Abdominal distention  ASSESSMENT:  1. Acute (? on chronic) systolic heart failure, probably toxic LV injury from ETOH 2. Acute liver failure due to hepatic congestion from CHF and probable chronic injury from  ETOH. 3. Obesity 4. Untreated sleep apnea, complicating right heart failure manifestations. 5. LV mural thrombus  Plan: 1. Probably needs inotropic support to facilitate diuresis given low BP 2. Continue diuresis as tolerated by BP 3. Heart failure team will assume primary role. 4. Continue systemic anticoagulation  Signed, Lesleigh Noe 06/02/2013, 7:55 AM

## 2013-06-02 NOTE — Progress Notes (Signed)
ANTICOAGULATION CONSULT NOTE - Follow Up Consult  Pharmacy Consult for heparin Indication: mural thrombus  Labs:  Recent Labs  05/30/13 1151 05/31/13 2353 05/31/13 2358 06/01/13 0008 06/01/13 0029 06/01/13 0520 06/01/13 0948 06/01/13 1530 06/01/13 1619 06/01/13 1918  HGB 15.6 15.7 17.3* 16.6  --   --   --   --   --   --   HCT 47.6 47.2 51.0 48.0  --   --   --   --   --   --   PLT 248.0 249  --  228  --   --   --   --   --   --   APTT  --   --   --   --   --   --   --   --  32 >200*  LABPROT  --   --   --   --   --   --   --   --  21.6* 34.6*  INR  --   --   --   --   --   --   --   --  1.94* 3.61*  HEPARINUNFRC  --   --   --  0.38  --   --   --   --   --   --   CREATININE 1.5  --  1.50*  --  1.39*  --   --   --   --   --   TROPONINI  --   --   --   --   --  <0.30 <0.30 <0.30  --   --     Assessment/Plan:  43yo male therapeutic on heparin with initial dosing for mural thrombus (lab listed as 3/15 but was drawn at [facility] 3/16).  Will continue gtt at current rate and confirm stable with am labs.  Vernard Gambles, PharmD, BCPS  06/02/2013,1:02 AM

## 2013-06-02 NOTE — Progress Notes (Signed)
UR Completed.  Cesar Alf Jane [phone] 06/02/2013  

## 2013-06-03 ENCOUNTER — Encounter: Payer: BC Managed Care – PPO | Admitting: Family Medicine

## 2013-06-03 DIAGNOSIS — I472 Ventricular tachycardia: Secondary | ICD-10-CM

## 2013-06-03 DIAGNOSIS — I4729 Other ventricular tachycardia: Secondary | ICD-10-CM

## 2013-06-03 LAB — HEPATITIS PANEL, ACUTE
HCV Ab: NEGATIVE
HEP B C IGM: NONREACTIVE
Hep A IgM: NONREACTIVE
Hepatitis B Surface Ag: NEGATIVE

## 2013-06-03 LAB — COMPREHENSIVE METABOLIC PANEL
ALBUMIN: 2.6 g/dL — AB (ref 3.5–5.2)
ALT: 1556 U/L — ABNORMAL HIGH (ref 0–53)
AST: 768 U/L — ABNORMAL HIGH (ref 0–37)
Alkaline Phosphatase: 107 U/L (ref 39–117)
BUN: 22 mg/dL (ref 6–23)
CO2: 33 mEq/L — ABNORMAL HIGH (ref 19–32)
CREATININE: 0.97 mg/dL (ref 0.50–1.35)
Calcium: 7.4 mg/dL — ABNORMAL LOW (ref 8.4–10.5)
Chloride: 93 mEq/L — ABNORMAL LOW (ref 96–112)
GFR calc Af Amer: >90 mL/min (ref >90)
GFR calc non Af Amer: >90 mL/min (ref >90)
Glucose, Bld: 107 mg/dL — ABNORMAL HIGH (ref 70–99)
Potassium: 3.3 mEq/L — ABNORMAL LOW (ref 3.7–5.3)
Sodium: 137 mEq/L (ref 137–147)
TOTAL PROTEIN: 5 g/dL — AB (ref 6.0–8.3)
Total Bilirubin: 1.8 mg/dL — ABNORMAL HIGH (ref 0.3–1.2)

## 2013-06-03 LAB — HEPARIN LEVEL (UNFRACTIONATED)
Heparin Unfractionated: 0.11 IU/mL — ABNORMAL LOW (ref 0.30–0.70)
Heparin Unfractionated: 0.34 IU/mL (ref 0.30–0.70)
Heparin Unfractionated: <0.1 IU/mL — ABNORMAL LOW (ref 0.30–0.70)

## 2013-06-03 LAB — CARBOXYHEMOGLOBIN
CARBOXYHEMOGLOBIN: 2 % — AB (ref 0.5–1.5)
CARBOXYHEMOGLOBIN: 1.9 % — AB (ref 0.5–1.5)
Methemoglobin: 1.4 % (ref 0.0–1.5)
Methemoglobin: 1.3 % (ref 0.0–1.5)
O2 SAT: 67.3 %
O2 Saturation: 76 %
Total hemoglobin: 14.2 g/dL (ref 13.5–18.0)
Total hemoglobin: 14 g/dL (ref 13.5–18.0)

## 2013-06-03 LAB — CBC
HEMATOCRIT: 41.2 % (ref 39.0–52.0)
Hemoglobin: 14 g/dL (ref 13.0–17.0)
MCH: 30.4 pg (ref 26.0–34.0)
MCHC: 34 g/dL (ref 30.0–36.0)
MCV: 89.4 fL (ref 78.0–100.0)
PLATELETS: 171 10*3/uL (ref 150–400)
RBC: 4.61 MIL/uL (ref 4.22–5.81)
RDW: 13.2 % (ref 11.5–15.5)
WBC: 12.3 10*3/uL — AB (ref 4.0–10.5)

## 2013-06-03 LAB — BASIC METABOLIC PANEL
BUN: 29 mg/dL — ABNORMAL HIGH (ref 6–23)
CO2: 32 mEq/L (ref 19–32)
CREATININE: 1.11 mg/dL (ref 0.50–1.35)
Calcium: 7.6 mg/dL — ABNORMAL LOW (ref 8.4–10.5)
Chloride: 97 mEq/L (ref 96–112)
GFR calc non Af Amer: 82 mL/min — ABNORMAL LOW (ref >90)
Glucose, Bld: 107 mg/dL — ABNORMAL HIGH (ref 70–99)
Potassium: 2.8 mEq/L — CL (ref 3.7–5.3)
Sodium: 141 mEq/L (ref 137–147)

## 2013-06-03 LAB — MAGNESIUM: Magnesium: 1.9 mg/dL (ref 1.5–2.5)

## 2013-06-03 LAB — PROTIME-INR
INR: 1.77 — ABNORMAL HIGH (ref 0.00–1.49)
PROTHROMBIN TIME: 20.1 s — AB (ref 11.6–15.2)

## 2013-06-03 MED ORDER — DOCUSATE SODIUM 50 MG/5ML PO LIQD
100.0000 mg | Freq: Two times a day (BID) | ORAL | Status: DC | PRN
Start: 2013-06-03 — End: 2013-06-04
  Administered 2013-06-03 – 2013-06-04 (×3): 100 mg via ORAL
  Filled 2013-06-03 (×3): qty 10

## 2013-06-03 MED ORDER — FUROSEMIDE 40 MG PO TABS
40.0000 mg | ORAL_TABLET | Freq: Two times a day (BID) | ORAL | Status: DC
  Administered 2013-06-03: 40 mg via ORAL
  Filled 2013-06-03 (×3): qty 1

## 2013-06-03 MED ORDER — POTASSIUM CHLORIDE CRYS ER 20 MEQ PO TBCR
40.0000 meq | EXTENDED_RELEASE_TABLET | Freq: Once | ORAL | Status: AC
  Administered 2013-06-03: 40 meq via ORAL
  Filled 2013-06-03: qty 2

## 2013-06-03 MED ORDER — FUROSEMIDE 40 MG PO TABS: 40.0000 mg | ORAL_TABLET | Freq: Every day | ORAL | Status: DC

## 2013-06-03 MED ORDER — POTASSIUM CHLORIDE CRYS ER 20 MEQ PO TBCR
40.0000 meq | EXTENDED_RELEASE_TABLET | ORAL | Status: AC
  Administered 2013-06-03: 40 meq via ORAL
  Filled 2013-06-03: qty 2

## 2013-06-03 MED ORDER — WARFARIN - PHARMACIST DOSING INPATIENT
Freq: Every day | Status: DC
  Administered 2013-06-08 – 2013-06-09 (×2)

## 2013-06-03 MED ORDER — MAGNESIUM SULFATE 40 MG/ML IJ SOLN
2.0000 g | Freq: Once | INTRAMUSCULAR | Status: AC
  Administered 2013-06-03: 2 g via INTRAVENOUS
  Filled 2013-06-03: qty 50

## 2013-06-03 MED ORDER — POTASSIUM CHLORIDE CRYS ER 20 MEQ PO TBCR
40.0000 meq | EXTENDED_RELEASE_TABLET | ORAL | Status: AC
  Administered 2013-06-03 (×2): 40 meq via ORAL
  Filled 2013-06-03 (×2): qty 2

## 2013-06-03 MED ORDER — WARFARIN SODIUM 5 MG PO TABS
5.0000 mg | ORAL_TABLET | Freq: Once | ORAL | Status: AC
  Administered 2013-06-03: 5 mg via ORAL
  Filled 2013-06-03: qty 1

## 2013-06-03 MED ORDER — SPIRONOLACTONE 25 MG PO TABS
25.0000 mg | ORAL_TABLET | Freq: Every day | ORAL | Status: DC
  Administered 2013-06-03 – 2013-06-07 (×5): 25 mg via ORAL
  Filled 2013-06-03 (×6): qty 1

## 2013-06-03 NOTE — Progress Notes (Signed)
ANTICOAGULATION CONSULT NOTE - Follow Up Consult  Pharmacy Consult for heparin Indication: mural thrombus  Labs:  Recent Labs  05/31/13 2358  06/01/13 0008 06/01/13 0029 06/01/13 0520 06/01/13 0948 06/01/13 1530 06/01/13 1619 06/01/13 1918 06/02/13 0650 06/02/13 2000 06/03/13 0443 06/03/13 0500  HGB 17.3*  --  16.6  --   --   --   --   --   --  15.0  --  14.0  --   HCT 51.0  --  48.0  --   --   --   --   --   --  43.9  --  41.2  --   PLT  --   --  228  --   --   --   --   --   --  198  --  171  --   APTT  --   --   --   --   --   --   --  32 >200*  --   --   --   --   LABPROT  --   --   --   --   --   --   --  21.6* 34.6*  --   --   --   --   INR  --   --   --   --   --   --   --  1.94* 3.61*  --   --   --   --   HEPARINUNFRC  --   < > 0.38  --   --   --   --   --   --  0.22* 0.14*  --  0.11*  CREATININE 1.50*  --   --  1.39*  --   --   --   --   --  1.63*  --   --   --   TROPONINI  --   --   --   --  <0.30 <0.30 <0.30  --   --   --   --   --   --   < > = values in this interval not displayed.   Assessment: 43yo male continues to have lower heparin levels despite rate increases, no gtt issues per RN; Hgb is down some from admission but still wnl, RN reports small amt of blood with cough overnight, HF MD aware.  Goal of Therapy:  Heparin level 0.3-0.5 units/ml   Plan:  Will increase heparin gtt by 4 units/kg/hr to 2500 units/hr and check level in 6hr; will also recheck INR to see how it is trending with acute liver dysfunction.  Vernard Gambles, PharmD, BCPS  06/03/2013,5:26 AM

## 2013-06-03 NOTE — Progress Notes (Signed)
Pt had asymptomatic 20 beat run of v-tach while at the side of the bed using the urinal. Dr. Terressa Koyanagi made aware. Will continue to monitor.

## 2013-06-03 NOTE — Progress Notes (Signed)
Pt. Was placed on CPAP auto titrate (min: 4, max: 18) via nasal mask with 2L O2 bled in. Pt. Is tolerating CPAP well at this time without any complications.

## 2013-06-03 NOTE — Care Management Note (Signed)
    Page 1 of 1   06/03/2013     11:54:42 AM   CARE MANAGEMENT NOTE 06/03/2013  Patient:  [name], [name]   Account Number:  [id number]  Date Initiated:  06/02/2013  Documentation initiated by:  Avie Arenas  Subjective/Objective Assessment:   Admitted with bilateral leg and abd swelling.  Found CHF and pulm thrombus.     Action/Plan:   Anticipated DC Date:  06/06/2013   Anticipated DC Plan:  HOME/SELF CARE      DC Planning Services  CM consult      Choice offered to / List presented to:             Status of service:  In process, will continue to follow Medicare Important Message given?   (If response is "NO", the following Medicare IM given date fields will be blank) Date Medicare IM given:   Date Additional Medicare IM given:    Discharge Disposition:    Per UR Regulation:  Reviewed for med. necessity/level of care/duration of stay  If discussed at Long Length of Stay Meetings, dates discussed:    CommentsBenay PillowBrailyn, [name] [phone]  06-03-13 11:30pm Avie Arenas, RNBSN - [phone] Runs of SVT - need for life vest - contacted [facility] for start of process.   May need home Milrinone - will continue to follow.

## 2013-06-03 NOTE — Progress Notes (Signed)
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 Admitted with several week h/o HF symptoms. Denies strong FHx of CHF. No viral prodrome.   Echo reviewed and shows LVEF 10% with global HK, large LV thrombus and severe RV dysfunction.   Swan placed yesterday CVP 19  PA 41/25 (33)  PCWP 29  Thermo CO/CI 2.4/1.1  Co-ox yesterday 39% and started on milrinone 0.375 mcg. Stable overnight. Weight down 7 lbs and 24 hr I/O -6 liters. Denies SOB, orthopnea or CP. Remains on heparin gtt for large LV thrombus. Cr 1.1 and K+ 2.8. 20 beat run NSVT.  CVP 7 PA 34/13 (26) PCWP 22 CO/CI 5.16/2.28   Objective:   Weight Range:  Vital Signs:   Temp:  [98.6 F (37 C)-101.3 F (38.5 C)] 100 F (37.8 C) (03/17 0700) Pulse Rate:  [82-102] 95 (03/17 0700) Resp:  [17-34] 29 (03/17 0700) BP: (87-116)/(62-83) 109/75 mmHg (03/17 0700) SpO2:  [90 %-100 %] 91 % (03/17 0500) Weight:  [99.655 kg (219 lb 11.2 oz)] 99.655 kg (219 lb 11.2 oz) (03/17 0500) Last BM Date: 05/30/13  Weight change: Filed Weights   06/01/13 0359 06/02/13 0414 06/03/13 0500  Weight: 103.7 kg (228 lb 9.9 oz) 102.9 kg (226 lb 13.7 oz) 99.655 kg (219 lb 11.2 oz)    Intake/Output:   Intake/Output Summary (Last 24 hours) at 06/03/13 0854 Last data filed at 06/03/13 1610  Gross per 24 hour  Intake 1246.44 ml  Output   7300 ml  Net -6053.56 ml     Physical Exam: General:  Well appearing. No resp difficulty HEENT: normal Neck: supple. RIJ swan.CVP 6 Carotids 2+ bilat; no bruits. No lymphadenopathy or thryomegaly appreciated. Cor: PMI laterally displaced. Regular rate & rhythm. + s3 Lungs: clear Abdomen: soft, nontender, nondistended. No hepatosplenomegaly. No bruits or masses. Good bowel sounds. Extremities: no cyanosis, clubbing, rash, 2+ LE edema Neuro: alert & orientedx3, cranial nerves grossly intact. moves all 4  extremities w/o difficulty. Affect pleasant  Telemetry: SR 90s Labs: Basic Metabolic Panel:  Recent Labs Lab 05/30/13 1151 05/31/13 2358 06/01/13 0029 06/01/13 0948 06/02/13 0650 06/03/13 0443  NA 134* 135* 132*  --  137 141  K 4.6 4.3 4.6  --  4.4 2.8*  CL 101 101 95*  --  99 97  CO2 24  --  20  --  21 32  GLUCOSE 110* 110* 111*  --  123* 107*  BUN 18 28* 28*  --  37* 29*  CREATININE 1.5 1.50* 1.39*  --  1.63* 1.11  CALCIUM 8.9  --  9.1  --  8.3* 7.6*  MG  --   --   --  1.8  --  1.9    Liver Function Tests:  Recent Labs Lab 05/30/13 1151 06/01/13 0029 06/02/13 0650  AST 98* 351* 2636*  ALT 157* 484* 2138*  ALKPHOS 67 82 114  BILITOT 2.1* 1.7* 2.2*  PROT 5.9* 5.9* 5.2*  ALBUMIN 3.6 3.3* 3.0*    Recent Labs Lab 05/30/13 1151  LIPASE 44.0   No results found for this basename: AMMONIA,  in the last 168 hours  CBC:  Recent Labs Lab 05/30/13 1151 05/31/13 2353 05/31/13 2358 06/01/13 0008 06/02/13 0650 06/03/13 0443  WBC 11.8* 10.8*  --  12.5* 12.5* 12.3*  NEUTROABS  --  7.9*  --   --   --   --  HGB 15.6 15.7 17.3* 16.6 15.0 14.0  HCT 47.6 47.2 51.0 48.0 43.9 41.2  MCV 91.8 90.6  --  90.2 89.4 89.4  PLT 248.0 249  --  228 198 171    Cardiac Enzymes:  Recent Labs Lab 06/01/13 0520 06/01/13 0948 06/01/13 1530  TROPONINI <0.30 <0.30 <0.30    BNP: BNP (last 3 results)  Recent Labs  06/01/13 0029  PROBNP 6513.0*    Imaging: Dg Chest Port 1 View  06/02/2013   CLINICAL DATA:  Line placement.  EXAM: PORTABLE CHEST - 1 VIEW  COMPARISON:  Insert  FINDINGS: A right jugular central venous catheter was placed. Swan-Ganz catheter extends through the right jugular central line and the tip is in the right infrahilar region. Catheter tip is probably within the right lower lobe pulmonary artery. Few densities at the right costophrenic angle suggest atelectasis and small effusion. Otherwise, the visualized lungs are clear. Heart size is within normal  limit.  IMPRESSION: Swan-Ganz catheter within a right lower lobe pulmonary artery as described.  Negative for a pneumothorax.  Few densities at the right lung base suggest atelectasis and probably small effusion.  These results were called by telephone at the time of interpretation on 06/02/2013 at 12:17 PM to the patient's nurse, Boyd Kerbs, who verbally acknowledged these results.   Electronically Signed   By: Richarda Overlie M.D.   On: 06/02/2013 12:17     Medications:     Scheduled Medications: . digoxin  0.25 mg Oral Daily  . furosemide  40 mg Oral BID  . influenza vac split quadrivalent PF  0.5 mL Intramuscular Tomorrow-1000  . pantoprazole  40 mg Oral Daily  . pneumococcal 23 valent vaccine  0.5 mL Intramuscular Tomorrow-1000  . potassium chloride  20 mEq Oral Daily  . potassium chloride  40 mEq Oral Once  . sodium chloride  3 mL Intravenous Q12H  . spironolactone  25 mg Oral Daily    Infusions: . heparin 2,500 Units/hr (06/03/13 7711)  . milrinone 0.375 mcg/kg/min (06/03/13 0621)    PRN Medications: sodium chloride, acetaminophen, ALPRAZolam, ALPRAZolam, dextromethorphan, HYDROmorphone (DILAUDID) injection, ondansetron (ZOFRAN) IV, sodium chloride   Assessment:   1. Acute systolic HF with likely low-output physiology = cardiogenic shock  2. NICM 3. Shock kidney and liver  --acute renal and hepatic failure likely due to #1  4. ETOH use  5. Obesity 6. Hypokalemia 7. NSVT   Plan/Discussion:    Much improved since initiation of milrinone. Co-ox this am 76%. Weight down 7 lbs and 24 hr I/O -6 liters. CVP 6, however still has LE edema. Will place TED hose and change IV lasix to PO 40 mg bid. Start spiro 25 mg daily.  Can cut milrinone to 0.25 and repeat co-ox at 1p. Cr much improved will continue to follow.   Will need to have coronary angio today or tomorrow will look at schedule and maybe able to do this afternoon.  K+ 2.8 will supplement.  Will hold off on PICC currently,  but may need home milrinone.   Not currently on BB with shock. SBP 100-110s will hold off on ACE-I for at least another day. If Cr remains stable and pressure optimal will try to start low dose enalapril tomorrow.  20 beat run NSVT last night will place orders for LifeVest.  The patient is critically ill with multiple organ systems failure and requires high complexity decision making for assessment and support, frequent evaluation and titration of therapies, application of advanced monitoring  technologies and extensive interpretation of multiple databases.   Critical Care Time devoted to patient care services described in this note is 35 Minutes.   Length of Stay: 3   Arvilla Meresaniel Venia Riveron MD 06/03/2013, 8:54 AM  Advanced Heart Failure Team Pager [phone] (M-F; 7a - 4p)  Please contact [facility] for night-coverage after hours (4p -7a ) and weekends on [email or web address]

## 2013-06-03 NOTE — Progress Notes (Signed)
ANTICOAGULATION CONSULT NOTE - Follow Up Consult  Pharmacy Consult for heparin Indication: LV thrombus  No Known Allergies  Patient Measurements: Height: 6' (182.9 cm) Weight: 219 lb 11.2 oz (99.655 kg) IBW/kg (Calculated) : 77.6 Heparin Dosing Weight: 99 kg  Vital Signs: Temp: 99.3 F (37.4 C) (03/17 1600) Temp src: Core (Comment) (03/17 1600) BP: 95/66 mmHg (03/17 1913) Pulse Rate: 94 (03/17 1600)  Labs:  Recent Labs  06/01/13 0008  06/01/13 0520 06/01/13 0948 06/01/13 1530 06/01/13 1619 06/01/13 1918 06/02/13 0650 06/02/13 2000 06/03/13 0443 06/03/13 0500 06/03/13 1200  HGB 16.6  --   --   --   --   --   --  15.0  --  14.0  --   --   HCT 48.0  --   --   --   --   --   --  43.9  --  41.2  --   --   PLT 228  --   --   --   --   --   --  198  --  171  --   --   APTT  --   --   --   --   --  32 >200*  --   --   --   --   --   LABPROT  --   --   --   --   --  21.6* 34.6*  --   --   --   --  20.1*  INR  --   --   --   --   --  1.94* 3.61*  --   --   --   --  1.77*  HEPARINUNFRC 0.38  --   --   --   --   --   --  0.22* 0.14*  --  0.11* 0.34  CREATININE  --   < >  --   --   --   --   --  1.63*  --  1.11  --  0.97  TROPONINI  --   --  <0.30 <0.30 <0.30  --   --   --   --   --   --   --   < > = values in this interval not displayed.  Estimated Creatinine Clearance: 123.7 ml/min (by C-G formula based on Cr of 0.97).   Medications:  Scheduled:  . digoxin  0.25 mg Oral Daily  . furosemide  40 mg Oral BID  . pantoprazole  40 mg Oral Daily  . potassium chloride  20 mEq Oral Daily  . potassium chloride  40 mEq Oral Q4H  . sodium chloride  3 mL Intravenous Q12H  . spironolactone  25 mg Oral Daily  . Warfarin - Pharmacist Dosing Inpatient   Does not apply q1800   Infusions:  . heparin 2,500 Units/hr (06/03/13 1900)  . milrinone 0.25 mcg/kg/min (06/03/13 1900)    Assessment: 43 yo male with LV thrombus is currently on subtherapeutic heparin.  Heparin level is <0.1.   Verified with RN that heparin drip was not stopped and line is running well. Goal of Therapy:  Heparin level 0.3-0.7 units/ml Monitor platelets by anticoagulation protocol: Yes   Plan:  1) Increase heparin drip to 2800 units/hr.  2) Check a 6hr heparin level after rate is increased  Kekai Geter, Tsz-Yin 06/03/2013,7:34 PM

## 2013-06-03 NOTE — Progress Notes (Signed)
CRITICAL VALUE ALERT  Critical value received:  K+ 2.8  Date of notification:  06/03/13  Time of notification:  0530  Critical value read back:yes  Nurse who received alert:  Toula Moos  MD notified (1st page):  Dr. Terressa Koyanagi  Time of first page:  [phone]   Responding MD:  Dr. Terressa Koyanagi  Time MD responded:  [phone]

## 2013-06-03 NOTE — Progress Notes (Signed)
ANTICOAGULATION CONSULT NOTE - Follow Up Consult  Pharmacy Consult for Heparin/Coumadin Indication: LV thrombus  No Known Allergies  Patient Measurements: Height: 6' (182.9 cm) Weight: 219 lb 11.2 oz (99.655 kg) IBW/kg (Calculated) : 77.6 Heparin Dosing Weight: 99 kg  Vital Signs: Temp: 99.1 F (37.3 C) (03/17 1130) BP: 93/62 mmHg (03/17 1100) Pulse Rate: 95 (03/17 1130)  Labs:  Recent Labs  06/01/13 0008 06/01/13 0029 06/01/13 0520 06/01/13 0948 06/01/13 1530 06/01/13 1619 06/01/13 1918 06/02/13 0650 06/02/13 2000 06/03/13 0443 06/03/13 0500 06/03/13 1200  HGB 16.6  --   --   --   --   --   --  15.0  --  14.0  --   --   HCT 48.0  --   --   --   --   --   --  43.9  --  41.2  --   --   PLT 228  --   --   --   --   --   --  198  --  171  --   --   APTT  --   --   --   --   --  32 >200*  --   --   --   --   --   LABPROT  --   --   --   --   --  21.6* 34.6*  --   --   --   --  20.1*  INR  --   --   --   --   --  1.94* 3.61*  --   --   --   --  1.77*  HEPARINUNFRC 0.38  --   --   --   --   --   --  0.22* 0.14*  --  0.11* 0.34  CREATININE  --  1.39*  --   --   --   --   --  1.63*  --  1.11  --   --   TROPONINI  --   --  <0.30 <0.30 <0.30  --   --   --   --   --   --   --     Estimated Creatinine Clearance: 108.1 ml/min (by C-G formula based on Cr of 1.11).   Medications:  Infusions:  . heparin 2,500 Units/hr (06/03/13 2263)  . milrinone 0.25 mcg/kg/min (06/03/13 0946)    Assessment: 42 yom continues on IV heparin for anticoagulation or mural thrombus. Heparin level is now therapeutic on 2500 units/hr.  Pharmacy asked to begin oral anticoagulation with Coumadin today.  Initial INR high (3.6) due to liver dysfunction from shock, but better today at 1.77.  No bleeding or complications noted.  Goal of Therapy:  INR 2-3 Heparin level 0.3-0.7 units/ml Monitor platelets by anticoagulation protocol: Yes   Plan:  1. Continue IV heparin at current rate of 2500  units/hr. 2. Recheck heparin level in 6 hrs to confirm. 3. Coumadin 5 mg po x 1 tonight. 4. Coumadin education prior to discharge. 5. Daily PT/INR, CBC and heparin level.  Tad Moore, BCPS  Clinical Pharmacist Pager [phone]  06/03/2013 2:27 PM

## 2013-06-04 ENCOUNTER — Encounter (HOSPITAL_COMMUNITY)
Admission: EM | Disposition: A | Discharge status: Home / Self Care | Payer: Self-pay | Source: Home / Self Care | Attending: Cardiology

## 2013-06-04 DIAGNOSIS — I509 Heart failure, unspecified: Secondary | ICD-10-CM

## 2013-06-04 HISTORY — PX: CORONARY ANGIOGRAM: SHX5466

## 2013-06-04 LAB — CARBOXYHEMOGLOBIN
CARBOXYHEMOGLOBIN: 1.8 % — AB (ref 0.5–1.5)
METHEMOGLOBIN: 0.7 % (ref 0.0–1.5)
O2 Saturation: 67.2 %
Total hemoglobin: 12.6 g/dL — ABNORMAL LOW (ref 13.5–18.0)

## 2013-06-04 LAB — BASIC METABOLIC PANEL
BUN: 15 mg/dL (ref 6–23)
CO2: 30 mEq/L (ref 19–32)
Calcium: 7.6 mg/dL — ABNORMAL LOW (ref 8.4–10.5)
Chloride: 98 mEq/L (ref 96–112)
Creatinine, Ser: 0.87 mg/dL (ref 0.50–1.35)
GFR calc Af Amer: >90 mL/min (ref >90)
GFR calc non Af Amer: >90 mL/min (ref >90)
Glucose, Bld: 103 mg/dL — ABNORMAL HIGH (ref 70–99)
Potassium: 3.5 mEq/L — ABNORMAL LOW (ref 3.7–5.3)
Sodium: 139 mEq/L (ref 137–147)

## 2013-06-04 LAB — CBC
HEMATOCRIT: 39.6 % (ref 39.0–52.0)
HEMOGLOBIN: 13.4 g/dL (ref 13.0–17.0)
MCH: 30.5 pg (ref 26.0–34.0)
MCHC: 33.8 g/dL (ref 30.0–36.0)
MCV: 90.2 fL (ref 78.0–100.0)
Platelets: 121 10*3/uL — ABNORMAL LOW (ref 150–400)
RBC: 4.39 MIL/uL (ref 4.22–5.81)
RDW: 13.1 % (ref 11.5–15.5)
WBC: 8.4 10*3/uL (ref 4.0–10.5)

## 2013-06-04 LAB — PROTIME-INR
INR: 1.52 — AB (ref 0.00–1.49)
Prothrombin Time: 17.9 seconds — ABNORMAL HIGH (ref 11.6–15.2)

## 2013-06-04 LAB — HEPARIN LEVEL (UNFRACTIONATED): Heparin Unfractionated: 0.4 IU/mL (ref 0.30–0.70)

## 2013-06-04 SURGERY — CORONARY ANGIOGRAM

## 2013-06-04 MED ORDER — SODIUM CHLORIDE 0.9 % IV SOLN: 250.0000 mL | INTRAVENOUS | Status: DC | PRN

## 2013-06-04 MED ORDER — SODIUM CHLORIDE 0.9 % IJ SOLN: 3.0000 mL | INTRAMUSCULAR | Status: DC | PRN

## 2013-06-04 MED ORDER — NITROGLYCERIN 0.2 MG/ML ON CALL CATH LAB
INTRAVENOUS | Status: AC
Start: 2013-06-04 — End: 2013-06-04
  Filled 2013-06-04: qty 1

## 2013-06-04 MED ORDER — FENTANYL CITRATE 0.05 MG/ML IJ SOLN
INTRAMUSCULAR | Status: AC
  Filled 2013-06-04: qty 2

## 2013-06-04 MED ORDER — POTASSIUM CHLORIDE CRYS ER 20 MEQ PO TBCR
20.0000 meq | EXTENDED_RELEASE_TABLET | Freq: Every day | ORAL | Status: DC
Start: 2013-06-04 — End: 2013-06-06
  Administered 2013-06-04 – 2013-06-05 (×2): 20 meq via ORAL
  Filled 2013-06-04 (×2): qty 1

## 2013-06-04 MED ORDER — FUROSEMIDE 80 MG PO TABS
80.0000 mg | ORAL_TABLET | Freq: Two times a day (BID) | ORAL | Status: DC
  Administered 2013-06-04 – 2013-06-05 (×3): 80 mg via ORAL
  Filled 2013-06-04 (×6): qty 1

## 2013-06-04 MED ORDER — MIDAZOLAM HCL 2 MG/2ML IJ SOLN
INTRAMUSCULAR | Status: AC
  Filled 2013-06-04: qty 2

## 2013-06-04 MED ORDER — ENALAPRIL MALEATE 2.5 MG PO TABS
2.5000 mg | ORAL_TABLET | Freq: Two times a day (BID) | ORAL | Status: DC
  Administered 2013-06-04: 2.5 mg via ORAL
  Filled 2013-06-04 (×4): qty 1

## 2013-06-04 MED ORDER — SODIUM CHLORIDE 0.9 % IJ SOLN
10.0000 mL | Freq: Two times a day (BID) | INTRAMUSCULAR | Status: DC
  Administered 2013-06-05: 10 mL
  Administered 2013-06-08: 20 mL
  Administered 2013-06-08: 10 mL
  Administered 2013-06-09: 3 mL

## 2013-06-04 MED ORDER — CITALOPRAM HYDROBROMIDE 10 MG PO TABS
10.0000 mg | ORAL_TABLET | Freq: Every day | ORAL | Status: DC
  Administered 2013-06-04 – 2013-06-09 (×6): 10 mg via ORAL
  Filled 2013-06-04 (×6): qty 1

## 2013-06-04 MED ORDER — POTASSIUM CHLORIDE CRYS ER 20 MEQ PO TBCR
40.0000 meq | EXTENDED_RELEASE_TABLET | Freq: Every day | ORAL | Status: DC
  Administered 2013-06-04: 40 meq via ORAL
  Filled 2013-06-04 (×2): qty 2

## 2013-06-04 MED ORDER — VERAPAMIL HCL 2.5 MG/ML IV SOLN
INTRAVENOUS | Status: AC
  Filled 2013-06-04: qty 2

## 2013-06-04 MED ORDER — HEPARIN (PORCINE) IN NACL 2-0.9 UNIT/ML-% IJ SOLN
INTRAMUSCULAR | Status: AC
  Filled 2013-06-04: qty 1000

## 2013-06-04 MED ORDER — SODIUM CHLORIDE 0.9 % IV SOLN
INTRAVENOUS | Status: AC
Start: 2013-06-04 — End: 2013-06-04

## 2013-06-04 MED ORDER — LIDOCAINE HCL (PF) 1 % IJ SOLN
INTRAMUSCULAR | Status: AC
  Filled 2013-06-04: qty 30

## 2013-06-04 MED ORDER — WHITE PETROLATUM GEL: Status: DC | PRN

## 2013-06-04 MED ORDER — DOCUSATE SODIUM 100 MG PO CAPS
100.0000 mg | ORAL_CAPSULE | Freq: Two times a day (BID) | ORAL | Status: DC | PRN
  Administered 2013-06-05: 100 mg via ORAL
  Filled 2013-06-04 (×2): qty 1

## 2013-06-04 MED ORDER — FUROSEMIDE 10 MG/ML IJ SOLN
80.0000 mg | Freq: Once | INTRAMUSCULAR | Status: AC
  Administered 2013-06-04: 80 mg via INTRAVENOUS
  Filled 2013-06-04: qty 8

## 2013-06-04 MED ORDER — SODIUM CHLORIDE 0.9 % IJ SOLN: 3.0000 mL | Freq: Two times a day (BID) | INTRAMUSCULAR | Status: DC

## 2013-06-04 MED ORDER — HEPARIN SODIUM (PORCINE) 1000 UNIT/ML IJ SOLN
INTRAMUSCULAR | Status: AC
  Filled 2013-06-04: qty 1

## 2013-06-04 MED ORDER — ASPIRIN 81 MG PO CHEW
81.0000 mg | CHEWABLE_TABLET | ORAL | Status: AC
  Administered 2013-06-04: 81 mg via ORAL
  Filled 2013-06-04: qty 1

## 2013-06-04 MED ORDER — SODIUM CHLORIDE 0.9 % IJ SOLN
10.0000 mL | INTRAMUSCULAR | Status: DC | PRN
Start: 2013-06-04 — End: 2013-06-09

## 2013-06-04 MED ORDER — WARFARIN SODIUM 5 MG PO TABS
5.0000 mg | ORAL_TABLET | Freq: Once | ORAL | Status: AC
  Administered 2013-06-04: 5 mg via ORAL
  Filled 2013-06-04: qty 1

## 2013-06-04 MED ORDER — SALINE SPRAY 0.65 % NA SOLN
1.0000 | NASAL | Status: DC | PRN
  Filled 2013-06-04: qty 44

## 2013-06-04 NOTE — H&P (View-Only) (Signed)
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 Admitted with several week h/o HF symptoms. Denies strong FHx of CHF. No viral prodrome.   Echo reviewed and shows LVEF 10% with global HK, large LV thrombus and severe RV dysfunction.   Swan placed yesterday CVP 19  PA 41/25 (33)  PCWP 29  Thermo CO/CI 2.4/1.1  Initial co-ox 39% and started on milrinone 0.375 mcg. Stable overnight.  Milrinone weaned down to 0.25 yesterday.   Weight down 7 lbs and 24 hr I/O -6 liters. Denies SOB, orthopnea or CP. Remains on heparin gtt for large LV thrombus. 24 beat NSVT last night. Weight stable. SBP 88-109. K still low. Very tearful over his situation.   CVP 9 PA 37/26 (31) PCWP 29 CO/CI 4.6/2.1   Objective:   Weight Range:  Vital Signs:   Temp:  [98.1 F (36.7 C)-99.7 F (37.6 C)] 98.6 F (37 C) (03/18 0600) Pulse Rate:  [92-105] 93 (03/18 0600) Resp:  [16-39] 28 (03/18 0600) BP: (84-108)/(55-76) 94/65 mmHg (03/18 0600) SpO2:  [92 %-99 %] 92 % (03/18 0600) Weight:  [99.066 kg (218 lb 6.4 oz)] 99.066 kg (218 lb 6.4 oz) (03/18 0500) Last BM Date: 05/30/13  Weight change: Filed Weights   06/02/13 0414 06/03/13 0500 06/04/13 0500  Weight: 102.9 kg (226 lb 13.7 oz) 99.655 kg (219 lb 11.2 oz) 99.066 kg (218 lb 6.4 oz)    Intake/Output:   Intake/Output Summary (Last 24 hours) at 06/04/13 0907 Last data filed at 06/04/13 0700  Gross per 24 hour  Intake 1712.39 ml  Output   2050 ml  Net -337.61 ml     Physical Exam: General:  Tearful. No resp difficulty HEENT: normal Neck: supple. RIJ swan.CVP 6 Carotids 2+ bilat; no bruits. No lymphadenopathy or thryomegaly appreciated. Cor: PMI laterally displaced. Regular rate & rhythm. + s3 Lungs: clear Abdomen: soft, nontender, nondistended. No hepatosplenomegaly. No bruits or masses. Good bowel sounds. Extremities: no cyanosis, clubbing,  rash, 1+ LE edema Neuro: alert & orientedx3, cranial nerves grossly intact. moves all 4 extremities w/o difficulty. Affect tearful  Telemetry: SR 90-100s Labs: Basic Metabolic Panel:  Recent Labs Lab 06/01/13 0029 06/01/13 0948 06/02/13 0650 06/03/13 0443 06/03/13 1200 06/04/13 0330  NA 132*  --  137 141 137 139  K 4.6  --  4.4 2.8* 3.3* 3.5*  CL 95*  --  99 97 93* 98  CO2 20  --  21 32 33* 30  GLUCOSE 111*  --  123* 107* 107* 103*  BUN 28*  --  37* 29* 22 15  CREATININE 1.39*  --  1.63* 1.11 0.97 0.87  CALCIUM 9.1  --  8.3* 7.6* 7.4* 7.6*  MG  --  1.8  --  1.9  --   --     Liver Function Tests:  Recent Labs Lab 05/30/13 1151 06/01/13 0029 06/02/13 0650 06/03/13 1200  AST 98* 351* 2636* 768*  ALT 157* 484* 2138* 1556*  ALKPHOS 67 82 114 107  BILITOT 2.1* 1.7* 2.2* 1.8*  PROT 5.9* 5.9* 5.2* 5.0*  ALBUMIN 3.6 3.3* 3.0* 2.6*    Recent Labs Lab 05/30/13 1151  LIPASE 44.0   No results found for this basename: AMMONIA,  in the last 168 hours  CBC:  Recent Labs Lab 05/31/13 2353 05/31/13 2358 06/01/13 0008 06/02/13 0650 06/03/13 0443 06/04/13 0330  WBC 10.8*  --  12.5* 12.5* 12.3* 8.4  NEUTROABS 7.9*  --   --   --   --   --   HGB 15.7 17.3* 16.6 15.0 14.0 13.4  HCT 47.2 51.0 48.0 43.9 41.2 39.6  MCV 90.6  --  90.2 89.4 89.4 90.2  PLT 249  --  228 198 171 121*    Cardiac Enzymes:  Recent Labs Lab 06/01/13 0520 06/01/13 0948 06/01/13 1530  TROPONINI <0.30 <0.30 <0.30    BNP: BNP (last 3 results)  Recent Labs  06/01/13 0029  PROBNP 6513.0*    Imaging: Dg Chest Port 1 View  06/02/2013   CLINICAL DATA:  Line placement.  EXAM: PORTABLE CHEST - 1 VIEW  COMPARISON:  Insert  FINDINGS: A right jugular central venous catheter was placed. Swan-Ganz catheter extends through the right jugular central line and the tip is in the right infrahilar region. Catheter tip is probably within the right lower lobe pulmonary artery. Few densities at the right  costophrenic angle suggest atelectasis and small effusion. Otherwise, the visualized lungs are clear. Heart size is within normal limit.  IMPRESSION: Swan-Ganz catheter within a right lower lobe pulmonary artery as described.  Negative for a pneumothorax.  Few densities at the right lung base suggest atelectasis and probably small effusion.  These results were called by telephone at the time of interpretation on 06/02/2013 at 12:17 PM to the patient's nurse, Penny, who verbally acknowledged these results.   Electronically Signed   By: Adam  Henn M.D.   On: 06/02/2013 12:17     Medications:     Scheduled Medications: . digoxin  0.25 mg Oral Daily  . furosemide  40 mg Oral BID  . pantoprazole  40 mg Oral Daily  . potassium chloride  20 mEq Oral Daily  . sodium chloride  3 mL Intravenous Q12H  . spironolactone  25 mg Oral Daily  . Warfarin - Pharmacist Dosing Inpatient   Does not apply q1800    Infusions: . heparin 2,800 Units/hr (06/04/13 0242)  . milrinone 0.25 mcg/kg/min (06/03/13 2028)    PRN Medications: sodium chloride, acetaminophen, ALPRAZolam, ALPRAZolam, dextromethorphan, docusate, HYDROmorphone (DILAUDID) injection, ondansetron (ZOFRAN) IV, sodium chloride   Assessment:   1. Acute systolic HF with likely low-output physiology = cardiogenic shock  2. NICM 3. Shock kidney and liver  --acute renal and hepatic failure likely due to #1  4. ETOH use  5. Obesity 6. Hypokalemia 7. NSVT   Plan/Discussion:    Much improved since initiation of milrinone. But remains tenuous. Volume status up. Will give lasix 80 IV then switch to 80 po bid tonight. Ad enalapril 2.5 bid. If tolerates can try to wean milrinone to 0.125. Place picc and follow co-oxs closely. No b-blocker yet.   Continue heparin/coumadin for LV thrombus.   Will plan coronary angio today.   K+ 3.5 will supplement.  Recurrent 24- beat run NSVT last night will place orders for LifeVest.  Start Celexa 10mg daily  for situational depression.   The patient is critically ill with multiple organ systems failure and requires high complexity decision making for assessment and support, frequent evaluation and titration of therapies, application of advanced monitoring technologies and extensive interpretation of multiple databases.   Critical Care Time devoted to patient care services described in this note is 35 Minutes.   Length of Stay: 4 Tamaka Sawin MD 06/04/2013, 9:07 AM  Advanced Heart Failure Team Pager [phone] (M-F; 7a - 4p)  Please contact [facility] for night-coverage after hours (  4p -7a ) and weekends on [email or web address]

## 2013-06-04 NOTE — Progress Notes (Signed)
ANTICOAGULATION CONSULT NOTE - Follow Up Consult  Pharmacy Consult for heparin Indication: LV thrombus  Labs:  Recent Labs  06/01/13 0948 06/01/13 1530  06/01/13 1619 06/01/13 1918  06/02/13 0650  06/03/13 0443  06/03/13 1200 06/03/13 2030 06/04/13 0330  HGB  --   --   --   --   --   < > 15.0  --  14.0  --   --   --  13.4  HCT  --   --   --   --   --   --  43.9  --  41.2  --   --   --  39.6  PLT  --   --   --   --   --   --  198  --  171  --   --   --  121*  APTT  --   --   --  32 >200*  --   --   --   --   --   --   --   --   LABPROT  --   --   < > 21.6* 34.6*  --   --   --   --   --  20.1*  --  17.9*  INR  --   --   < > 1.94* 3.61*  --   --   --   --   --  1.77*  --  1.52*  HEPARINUNFRC  --   --   --   --   --   --  0.22*  < >  --   < > 0.34 <0.10* 0.40  CREATININE  --   --   --   --   --   < > 1.63*  --  1.11  --  0.97  --  0.87  TROPONINI <0.30 <0.30  --   --   --   --   --   --   --   --   --   --   --   < > = values in this interval not displayed.   Assessment/Plan:  43yo male therapeutic on heparin after rate adjustment with difficulty maintaining goal levels. Will continue gtt at current rate and confirm stable with additional level.   Vernard Gambles, PharmD, BCPS  06/04/2013,5:48 AM

## 2013-06-04 NOTE — CV Procedure (Signed)
Cardiac Cath Procedure Note:  Indication: Heart failure  Procedures performed:  1) Selective coronary angiography  Description of procedure:   The risks and indication of the procedure were explained. Consent was signed and placed on the chart. An appropriate timeout was taken prior to the procedure. After a normal Allen's test was confirmed, the right wrist was prepped and draped in the routine sterile fashion and anesthetized with 1% local lidocaine.   A 5 FR arterial sheath was then placed in the right radial artery using a modified Seldinger technique. Systemic heparin was administered. 3mg  IV verapamil was given through the sheath. Standard catheters including a JL 3.5, JR4 were used. All catheter exchanges were made over a wire. The AoV was not crossed to the presence of an LV thrombus  Complications:  None apparent  Findings:  Ao Pressure: 107/73 (81)  Left main: Normal.   LAD: Long vessel wrapping the apex. Large 1st diagonal. Small 2nd diagonal. Normal.   LCX: Large vessel with moderate-sized Ramus. Large OM-1. Small OM-2. Normal.   RCA: Dominant. Normal.   Assessment: 1. Normal coronary arteries 2. Severe NICM  Plan/Discussion:  Continue medical therapy.    Arvilla Meres MD 1:01 PM

## 2013-06-04 NOTE — Progress Notes (Addendum)
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 Admitted with several week h/o HF symptoms. Denies strong FHx of CHF. No viral prodrome.   Echo reviewed and shows LVEF 10% with global HK, large LV thrombus and severe RV dysfunction.   Swan placed yesterday CVP 19  PA 41/25 (33)  PCWP 29  Thermo CO/CI 2.4/1.1  Initial co-ox 39% and started on milrinone 0.375 mcg. Stable overnight.  Milrinone weaned down to 0.25 yesterday.   Weight down 7 lbs and 24 hr I/O -6 liters. Denies SOB, orthopnea or CP. Remains on heparin gtt for large LV thrombus. 24 beat NSVT last night. Weight stable. SBP 88-109. K still low. Very tearful over his situation.   CVP 9 PA 37/26 (31) PCWP 29 CO/CI 4.6/2.1   Objective:   Weight Range:  Vital Signs:   Temp:  [98.1 F (36.7 C)-99.7 F (37.6 C)] 98.6 F (37 C) (03/18 0600) Pulse Rate:  [92-105] 93 (03/18 0600) Resp:  [16-39] 28 (03/18 0600) BP: (84-108)/(55-76) 94/65 mmHg (03/18 0600) SpO2:  [92 %-99 %] 92 % (03/18 0600) Weight:  [99.066 kg (218 lb 6.4 oz)] 99.066 kg (218 lb 6.4 oz) (03/18 0500) Last BM Date: 05/30/13  Weight change: Filed Weights   06/02/13 0414 06/03/13 0500 06/04/13 0500  Weight: 102.9 kg (226 lb 13.7 oz) 99.655 kg (219 lb 11.2 oz) 99.066 kg (218 lb 6.4 oz)    Intake/Output:   Intake/Output Summary (Last 24 hours) at 06/04/13 0907 Last data filed at 06/04/13 0700  Gross per 24 hour  Intake 1712.39 ml  Output   2050 ml  Net -337.61 ml     Physical Exam: General:  Tearful. No resp difficulty HEENT: normal Neck: supple. RIJ swan.CVP 6 Carotids 2+ bilat; no bruits. No lymphadenopathy or thryomegaly appreciated. Cor: PMI laterally displaced. Regular rate & rhythm. + s3 Lungs: clear Abdomen: soft, nontender, nondistended. No hepatosplenomegaly. No bruits or masses. Good bowel sounds. Extremities: no cyanosis, clubbing,  rash, 1+ LE edema Neuro: alert & orientedx3, cranial nerves grossly intact. moves all 4 extremities w/o difficulty. Affect tearful  Telemetry: SR 90-100s Labs: Basic Metabolic Panel:  Recent Labs Lab 06/01/13 0029 06/01/13 0948 06/02/13 0650 06/03/13 0443 06/03/13 1200 06/04/13 0330  NA 132*  --  137 141 137 139  K 4.6  --  4.4 2.8* 3.3* 3.5*  CL 95*  --  99 97 93* 98  CO2 20  --  21 32 33* 30  GLUCOSE 111*  --  123* 107* 107* 103*  BUN 28*  --  37* 29* 22 15  CREATININE 1.39*  --  1.63* 1.11 0.97 0.87  CALCIUM 9.1  --  8.3* 7.6* 7.4* 7.6*  MG  --  1.8  --  1.9  --   --     Liver Function Tests:  Recent Labs Lab 05/30/13 1151 06/01/13 0029 06/02/13 0650 06/03/13 1200  AST 98* 351* 2636* 768*  ALT 157* 484* 2138* 1556*  ALKPHOS 67 82 114 107  BILITOT 2.1* 1.7* 2.2* 1.8*  PROT 5.9* 5.9* 5.2* 5.0*  ALBUMIN 3.6 3.3* 3.0* 2.6*    Recent Labs Lab 05/30/13 1151  LIPASE 44.0   No results found for this basename: AMMONIA,  in the last 168 hours  CBC:  Recent Labs Lab 05/31/13 2353 05/31/13 2358 06/01/13 0008 06/02/13 0650 06/03/13 0443 06/04/13 0330  WBC 10.8*  --  12.5* 12.5* 12.3* 8.4  NEUTROABS 7.9*  --   --   --   --   --   HGB 15.7 17.3* 16.6 15.0 14.0 13.4  HCT 47.2 51.0 48.0 43.9 41.2 39.6  MCV 90.6  --  90.2 89.4 89.4 90.2  PLT 249  --  228 198 171 121*    Cardiac Enzymes:  Recent Labs Lab 06/01/13 0520 06/01/13 0948 06/01/13 1530  TROPONINI <0.30 <0.30 <0.30    BNP: BNP (last 3 results)  Recent Labs  06/01/13 0029  PROBNP 6513.0*    Imaging: Dg Chest Port 1 View  06/02/2013   CLINICAL DATA:  Line placement.  EXAM: PORTABLE CHEST - 1 VIEW  COMPARISON:  Insert  FINDINGS: A right jugular central venous catheter was placed. Swan-Ganz catheter extends through the right jugular central line and the tip is in the right infrahilar region. Catheter tip is probably within the right lower lobe pulmonary artery. Few densities at the right  costophrenic angle suggest atelectasis and small effusion. Otherwise, the visualized lungs are clear. Heart size is within normal limit.  IMPRESSION: Swan-Ganz catheter within a right lower lobe pulmonary artery as described.  Negative for a pneumothorax.  Few densities at the right lung base suggest atelectasis and probably small effusion.  These results were called by telephone at the time of interpretation on 06/02/2013 at 12:17 PM to the patient's nurse, Boyd KerbsPenny, who verbally acknowledged these results.   Electronically Signed   By: Richarda OverlieAdam  Henn M.D.   On: 06/02/2013 12:17     Medications:     Scheduled Medications: . digoxin  0.25 mg Oral Daily  . furosemide  40 mg Oral BID  . pantoprazole  40 mg Oral Daily  . potassium chloride  20 mEq Oral Daily  . sodium chloride  3 mL Intravenous Q12H  . spironolactone  25 mg Oral Daily  . Warfarin - Pharmacist Dosing Inpatient   Does not apply q1800    Infusions: . heparin 2,800 Units/hr (06/04/13 0242)  . milrinone 0.25 mcg/kg/min (06/03/13 2028)    PRN Medications: sodium chloride, acetaminophen, ALPRAZolam, ALPRAZolam, dextromethorphan, docusate, HYDROmorphone (DILAUDID) injection, ondansetron (ZOFRAN) IV, sodium chloride   Assessment:   1. Acute systolic HF with likely low-output physiology = cardiogenic shock  2. NICM 3. Shock kidney and liver  --acute renal and hepatic failure likely due to #1  4. ETOH use  5. Obesity 6. Hypokalemia 7. NSVT   Plan/Discussion:    Much improved since initiation of milrinone. But remains tenuous. Volume status up. Will give lasix 80 IV then switch to 80 po bid tonight. Ad enalapril 2.5 bid. If tolerates can try to wean milrinone to 0.125. Place picc and follow co-oxs closely. No b-blocker yet.   Continue heparin/coumadin for LV thrombus.   Will plan coronary angio today.   K+ 3.5 will supplement.  Recurrent 24- beat run NSVT last night will place orders for LifeVest.  Start Celexa 10mg  daily  for situational depression.   The patient is critically ill with multiple organ systems failure and requires high complexity decision making for assessment and support, frequent evaluation and titration of therapies, application of advanced monitoring technologies and extensive interpretation of multiple databases.   Critical Care Time devoted to patient care services described in this note is 35 Minutes.   Length of Stay: 4 Arvilla Meresaniel Cielo Arias MD 06/04/2013, 9:07 AM  Advanced Heart Failure Team Pager [phone] (M-F; 7a - 4p)  Please contact [facility] for night-coverage after hours (  4p -7a ) and weekends on [email or web address]

## 2013-06-04 NOTE — Progress Notes (Signed)
Peripherally Inserted Central Catheter/Midline Placement  The IV Nurse has discussed with the patient and/or persons authorized to consent for the patient, the purpose of this procedure and the potential benefits and risks involved with this procedure.  The benefits include less needle sticks, lab draws from the catheter and patient may be discharged home with the catheter.  Risks include, but not limited to, infection, bleeding, blood clot (thrombus formation), and puncture of an artery; nerve damage and irregular heat beat.  Alternatives to this procedure were also discussed.  PICC/Midline Placement Documentation        [name] 06/04/2013, 5:28 PM

## 2013-06-04 NOTE — Interval H&P Note (Signed)
History and Physical Interval Note:  06/04/2013 12:38 PM  [name]  has presented today for surgery, with the diagnosis of HF  The various methods of treatment have been discussed with the patient and family. After consideration of risks, benefits and other options for treatment, the patient has consented to  Procedure(s): LEFT HEART CATHETERIZATION WITH CORONARY ANGIOGRAM (N/A) with possible angioplastyCath Lab Visit (complete for each Cath Lab visit)  Clinical Evaluation Leading to the Procedure:   ACS: no  Non-ACS:    Anginal Classification: CCS IV  Anti-ischemic medical therapy: No Therapy  Non-Invasive Test Results: No non-invasive testing performed  Prior CABG: No previous CABG       as a surgical intervention .  The patient's history has been reviewed, patient examined, no change in status, stable for surgery.  I have reviewed the patient's chart and labs.  Questions were answered to the patient's satisfaction.     [name]

## 2013-06-05 LAB — CBC
HCT: 41.7 % (ref 39.0–52.0)
Hemoglobin: 13.7 g/dL (ref 13.0–17.0)
MCH: 29.9 pg (ref 26.0–34.0)
MCHC: 32.9 g/dL (ref 30.0–36.0)
MCV: 91 fL (ref 78.0–100.0)
PLATELETS: 135 10*3/uL — AB (ref 150–400)
RBC: 4.58 MIL/uL (ref 4.22–5.81)
RDW: 13.2 % (ref 11.5–15.5)
WBC: 8.3 10*3/uL (ref 4.0–10.5)

## 2013-06-05 LAB — CARBOXYHEMOGLOBIN
CARBOXYHEMOGLOBIN: 1.8 % — AB (ref 0.5–1.5)
Carboxyhemoglobin: 1.7 % — ABNORMAL HIGH (ref 0.5–1.5)
METHEMOGLOBIN: 0.6 % (ref 0.0–1.5)
Methemoglobin: 0.6 % (ref 0.0–1.5)
O2 SAT: 69.4 %
O2 Saturation: 78.8 %
TOTAL HEMOGLOBIN: 13.5 g/dL (ref 13.5–18.0)
TOTAL HEMOGLOBIN: 14 g/dL (ref 13.5–18.0)

## 2013-06-05 LAB — BASIC METABOLIC PANEL
BUN: 11 mg/dL (ref 6–23)
CHLORIDE: 98 meq/L (ref 96–112)
CO2: 30 mEq/L (ref 19–32)
Calcium: 7.7 mg/dL — ABNORMAL LOW (ref 8.4–10.5)
Creatinine, Ser: 0.86 mg/dL (ref 0.50–1.35)
GFR calc Af Amer: >90 mL/min (ref >90)
GFR calc non Af Amer: >90 mL/min (ref >90)
GLUCOSE: 167 mg/dL — AB (ref 70–99)
Potassium: 4.1 mEq/L (ref 3.7–5.3)
Sodium: 136 mEq/L — ABNORMAL LOW (ref 137–147)

## 2013-06-05 LAB — HEPARIN LEVEL (UNFRACTIONATED): Heparin Unfractionated: 0.42 IU/mL (ref 0.30–0.70)

## 2013-06-05 LAB — TYPE AND SCREEN
ABO/RH(D): O POS
ANTIBODY SCREEN: NEGATIVE

## 2013-06-05 LAB — PROTIME-INR
INR: 1.74 — ABNORMAL HIGH (ref 0.00–1.49)
Prothrombin Time: 19.8 seconds — ABNORMAL HIGH (ref 11.6–15.2)

## 2013-06-05 LAB — ABO/RH: ABO/RH(D): O POS

## 2013-06-05 MED ORDER — LISINOPRIL 2.5 MG PO TABS
2.5000 mg | ORAL_TABLET | Freq: Two times a day (BID) | ORAL | Status: DC
  Administered 2013-06-05 – 2013-06-06 (×3): 2.5 mg via ORAL
  Filled 2013-06-05 (×5): qty 1

## 2013-06-05 MED ORDER — WARFARIN SODIUM 5 MG PO TABS
5.0000 mg | ORAL_TABLET | Freq: Once | ORAL | Status: AC
  Administered 2013-06-05: 5 mg via ORAL
  Filled 2013-06-05: qty 1

## 2013-06-05 MED ORDER — POLYETHYLENE GLYCOL 3350 17 G PO PACK
17.0000 g | PACK | Freq: Every day | ORAL | Status: DC | PRN
  Administered 2013-06-05: 17 g via ORAL
  Filled 2013-06-05 (×2): qty 1

## 2013-06-05 MED ORDER — LISINOPRIL 2.5 MG PO TABS
2.5000 mg | ORAL_TABLET | Freq: Every day | ORAL | Status: DC
  Administered 2013-06-05: 2.5 mg via ORAL
  Filled 2013-06-05: qty 1

## 2013-06-05 NOTE — Progress Notes (Signed)
RN placed patient on cpap. Patient is tolerating cpap well at this time. RT will continue to monitor.

## 2013-06-05 NOTE — Progress Notes (Signed)
Patient placed himself on cpap and is tolerating well at this time. RT will continue monitor.

## 2013-06-05 NOTE — Telephone Encounter (Signed)
Couldn't reach pt by phone but he as a f/u appt on 06/10/13

## 2013-06-05 NOTE — Progress Notes (Signed)
ANTICOAGULATION CONSULT NOTE - Follow Up Consult  Pharmacy Consult for Heparin / Warfarin Indication: LV Thrombus  No Known Allergies  Patient Measurements: Height: 6' (182.9 cm) Weight: 210 lb 12.2 oz (95.6 kg) IBW/kg (Calculated) : 77.6   Vital Signs: Temp: 97.8 F (36.6 C) (03/19 0730) Temp src: Oral (03/19 0730) BP: 102/71 mmHg (03/19 0700) Pulse Rate: 102 (03/19 1058)  Labs:  Recent Labs  06/03/13 0443  06/03/13 1200 06/03/13 2030 06/04/13 0330 06/05/13 0350  HGB 14.0  --   --   --  13.4 13.7  HCT 41.2  --   --   --  39.6 41.7  PLT 171  --   --   --  121* 135*  LABPROT  --   --  20.1*  --  17.9* 19.8*  INR  --   --  1.77*  --  1.52* 1.74*  HEPARINUNFRC  --   < > 0.34 <0.10* 0.40 0.42  CREATININE 1.11  --  0.97  --  0.87 0.86  < > = values in this interval not displayed.  Estimated Creatinine Clearance: 137 ml/min (by C-G formula based on Cr of 0.86).     Assessment: 42yom admitted with new acute heart failure.  LV thrombus was found on ECHO.  Heparin drip was started for anticoagulation drrip rate 2800 uts/hr, Heparin level 0.42 at goal.  No bleeding noted, CBC stable.  Coumadin started 3/17 INR 1.74 today.    Goal of Therapy:  INR 2-3 Heparin level 0.3-0.7 units/ml Monitor platelets by anticoagulation protocol: Yes   Plan:  Continue Heparin drip 2800 uts/hr Coumadin 5mg  x1 today Daily Protime, Heparin level, CBC  Leota Sauers Pharm.D. CPP, BCPS Clinical Pharmacist [phone] 06/05/2013 11:08 AM

## 2013-06-05 NOTE — Progress Notes (Signed)
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 Admitted with several week h/o HF symptoms. Denies strong FHx of CHF. No viral prodrome.   Echo reviewed and shows LVEF 10% with global HK, large LV thrombus and severe RV dysfunction.   Swan placed yesterday CVP 19  PA 41/25 (33)  PCWP 29  Thermo CO/CI 2.4/1.1  Initial co-ox 39% and started on milrinone 0.375 mcg. Milrinone weaned down to 0.25.   On 3/18 underwent coronary angio with normal coronaries. Low-dose enalapril added yesterday. Swan pulled. Started on Celexa for situational depression. Given one dose IV lasix with brisk u/o -6L. Weight down 8 pounds.  Did not get night dose of enalapril last night. SBP dropped to 69 last night. Now 102/70.   Feels OK. Denies CP or dyspnea.   Co-ox 79%.  Blood type O pos    Objective:   Weight Range:  Vital Signs:   Temp:  [97.3 F (36.3 C)-98.9 F (37.2 C)] 97.8 F (36.6 C) (03/19 0730) Pulse Rate:  [95-114] 105 (03/19 0700) Resp:  [14-29] 20 (03/18 2000) BP: (69-109)/(47-86) 102/71 mmHg (03/19 0700) SpO2:  [87 %-97 %] 96 % (03/19 0700) Weight:  [95.6 kg (210 lb 12.2 oz)] 95.6 kg (210 lb 12.2 oz) (03/19 0538) Last BM Date: 05/30/13  Weight change: Filed Weights   06/03/13 0500 06/04/13 0500 06/05/13 0538  Weight: 99.655 kg (219 lb 11.2 oz) 99.066 kg (218 lb 6.4 oz) 95.6 kg (210 lb 12.2 oz)    Intake/Output:   Intake/Output Summary (Last 24 hours) at 06/05/13 0832 Last data filed at 06/05/13 0600  Gross per 24 hour  Intake 1453.4 ml  Output   7825 ml  Net -6371.6 ml     Physical Exam: General:  Lying flat No resp difficulty HEENT: normal Neck: supple. CVP 9 Carotids 2+ bilat; no bruits. No lymphadenopathy or thryomegaly appreciated. Cor: PMI laterally displaced. Regular rate & rhythm. + s3 Lungs: clear Abdomen: soft, nontender, nondistended. No  hepatosplenomegaly. No bruits or masses. Good bowel sounds. Extremities: no cyanosis, clubbing, rash, 1+ LE edema Neuro: alert & orientedx3, cranial nerves grossly intact. moves all 4 extremities w/o difficulty. Affect tearful  Telemetry: SR 90-100s Labs: Basic Metabolic Panel:  Recent Labs Lab 06/01/13 0029 06/01/13 0948 06/02/13 0650 06/03/13 0443 06/03/13 1200 06/04/13 0330 06/05/13 0350  NA 132*  --  137 141 137 139 136*  K 4.6  --  4.4 2.8* 3.3* 3.5* 4.1  CL 95*  --  99 97 93* 98 98  CO2 20  --  21 32 33* 30 30  GLUCOSE 111*  --  123* 107* 107* 103* 167*  BUN 28*  --  37* 29* 22 15 11   CREATININE 1.39*  --  1.63* 1.11 0.97 0.87 0.86  CALCIUM 9.1  --  8.3* 7.6* 7.4* 7.6* 7.7*  MG  --  1.8  --  1.9  --   --   --     Liver Function Tests:  Recent Labs Lab 05/30/13 1151 06/01/13 0029 06/02/13 0650 06/03/13 1200  AST 98* 351* 2636* 768*  ALT 157* 484* 2138* 1556*  ALKPHOS 67 82 114 107  BILITOT 2.1* 1.7* 2.2* 1.8*  PROT 5.9* 5.9* 5.2* 5.0*  ALBUMIN 3.6 3.3* 3.0* 2.6*    Recent Labs Lab 05/30/13 1151  LIPASE 44.0   No results found for this basename: AMMONIA,  in the  last 168 hours  CBC:  Recent Labs Lab 05/31/13 2353  06/01/13 0008 06/02/13 0650 06/03/13 0443 06/04/13 0330 06/05/13 0350  WBC 10.8*  --  12.5* 12.5* 12.3* 8.4 8.3  NEUTROABS 7.9*  --   --   --   --   --   --   HGB 15.7  < > 16.6 15.0 14.0 13.4 13.7  HCT 47.2  < > 48.0 43.9 41.2 39.6 41.7  MCV 90.6  --  90.2 89.4 89.4 90.2 91.0  PLT 249  --  228 198 171 121* 135*  < > = values in this interval not displayed.  Cardiac Enzymes:  Recent Labs Lab 06/01/13 0520 06/01/13 0948 06/01/13 1530  TROPONINI <0.30 <0.30 <0.30    BNP: BNP (last 3 results)  Recent Labs  06/01/13 0029  PROBNP 6513.0*    Imaging: No results found.   Medications:     Scheduled Medications: . citalopram  10 mg Oral Daily  . digoxin  0.25 mg Oral Daily  . enalapril  2.5 mg Oral BID  .  furosemide  80 mg Oral BID  . pantoprazole  40 mg Oral Daily  . potassium chloride  20 mEq Oral Daily  . potassium chloride  40 mEq Oral Daily  . sodium chloride  10-40 mL Intracatheter Q12H  . sodium chloride  3 mL Intravenous Q12H  . spironolactone  25 mg Oral Daily  . Warfarin - Pharmacist Dosing Inpatient   Does not apply q1800    Infusions: . heparin 2,800 Units/hr (06/05/13 0221)  . milrinone 0.25 mcg/kg/min (06/04/13 2218)    PRN Medications: sodium chloride, acetaminophen, ALPRAZolam, ALPRAZolam, dextromethorphan, docusate sodium, HYDROmorphone (DILAUDID) injection, ondansetron (ZOFRAN) IV, sodium chloride, sodium chloride, sodium chloride, white petrolatum   Assessment:   1. Acute systolic HF with likely low-output physiology = cardiogenic shock  2. NICM 3. Shock kidney and liver  --acute renal and hepatic failure likely due to #1  4. ETOH use  5. Obesity 6. Hypokalemia 7. NSVT 8. Blood type - O positive.    Plan/Discussion:    Co-ox is good but remains tenuous without much apparent reserve. Unable to tolerate addition of enalapril. Will try low-dose lisinopril. We discussed possible need for advanced therapies if gets worse.   Will transfer him to SDU and continue to watch him closely. No b-blocker yet.   On Celexa 10mg  daily for situational depression.    Length of Stay: 5 Arvilla Meresaniel Rozanne Heumann MD 06/05/2013, 8:32 AM  Advanced Heart Failure Team Pager [phone] (M-F; 7a - 4p)  Please contact [facility] for night-coverage after hours (4p -7a ) and weekends on [email or web address]

## 2013-06-06 LAB — COMPREHENSIVE METABOLIC PANEL
ALT: 1003 U/L — AB (ref 0–53)
AST: 331 U/L — ABNORMAL HIGH (ref 0–37)
Albumin: 2.5 g/dL — ABNORMAL LOW (ref 3.5–5.2)
Alkaline Phosphatase: 150 U/L — ABNORMAL HIGH (ref 39–117)
BILIRUBIN TOTAL: 0.9 mg/dL (ref 0.3–1.2)
BUN: 10 mg/dL (ref 6–23)
CHLORIDE: 98 meq/L (ref 96–112)
CO2: 27 meq/L (ref 19–32)
Calcium: 8.4 mg/dL (ref 8.4–10.5)
Creatinine, Ser: 0.83 mg/dL (ref 0.50–1.35)
GFR calc Af Amer: >90 mL/min (ref >90)
GLUCOSE: 140 mg/dL — AB (ref 70–99)
Potassium: 4.2 mEq/L (ref 3.7–5.3)
SODIUM: 135 meq/L — AB (ref 137–147)
Total Protein: 5.8 g/dL — ABNORMAL LOW (ref 6.0–8.3)

## 2013-06-06 LAB — CARBOXYHEMOGLOBIN
CARBOXYHEMOGLOBIN: 1.7 % — AB (ref 0.5–1.5)
Carboxyhemoglobin: 2.1 % — ABNORMAL HIGH (ref 0.5–1.5)
Methemoglobin: 0.6 % (ref 0.0–1.5)
Methemoglobin: 1.4 % (ref 0.0–1.5)
O2 Saturation: 84.6 %
O2 Saturation: 75.7 %
TOTAL HEMOGLOBIN: 14.7 g/dL (ref 13.5–18.0)
Total hemoglobin: 15.5 g/dL (ref 13.5–18.0)

## 2013-06-06 LAB — CBC
HCT: 45.3 % (ref 39.0–52.0)
Hemoglobin: 14.7 g/dL (ref 13.0–17.0)
MCH: 29.5 pg (ref 26.0–34.0)
MCHC: 32.5 g/dL (ref 30.0–36.0)
MCV: 90.8 fL (ref 78.0–100.0)
PLATELETS: 152 10*3/uL (ref 150–400)
RBC: 4.99 MIL/uL (ref 4.22–5.81)
RDW: 13.3 % (ref 11.5–15.5)
WBC: 7.9 10*3/uL (ref 4.0–10.5)

## 2013-06-06 LAB — PROTIME-INR
INR: 1.86 — ABNORMAL HIGH (ref 0.00–1.49)
Prothrombin Time: 20.9 seconds — ABNORMAL HIGH (ref 11.6–15.2)

## 2013-06-06 LAB — HEPARIN LEVEL (UNFRACTIONATED)
HEPARIN UNFRACTIONATED: 0.82 [IU]/mL — AB (ref 0.30–0.70)
Heparin Unfractionated: 0.76 IU/mL — ABNORMAL HIGH (ref 0.30–0.70)

## 2013-06-06 MED ORDER — WARFARIN SODIUM 5 MG PO TABS
5.0000 mg | ORAL_TABLET | Freq: Once | ORAL | Status: AC
  Administered 2013-06-06: 5 mg via ORAL
  Filled 2013-06-06: qty 1

## 2013-06-06 MED ORDER — MILRINONE IN DEXTROSE 20 MG/100ML IV SOLN
0.1250 ug/kg/min | INTRAVENOUS | Status: DC
  Administered 2013-06-06 – 2013-06-07 (×2): 0.125 ug/kg/min via INTRAVENOUS
  Filled 2013-06-06 (×2): qty 100

## 2013-06-06 MED ORDER — POLYETHYLENE GLYCOL 3350 17 G PO PACK
17.0000 g | PACK | Freq: Every day | ORAL | Status: DC
  Administered 2013-06-06 – 2013-06-09 (×4): 17 g via ORAL
  Filled 2013-06-06 (×4): qty 1

## 2013-06-06 MED ORDER — FUROSEMIDE 40 MG PO TABS
40.0000 mg | ORAL_TABLET | Freq: Every day | ORAL | Status: DC
  Filled 2013-06-06: qty 1

## 2013-06-06 NOTE — Progress Notes (Signed)
Patient places self on CPAP.  RT will continue to monitor. 

## 2013-06-06 NOTE — Progress Notes (Signed)
ANTICOAGULATION CONSULT NOTE - Follow Up Consult  Pharmacy Consult for Heparin  Indication: LV thrombus  No Known Allergies  Patient Measurements: Height: 6' (182.9 cm) Weight: 210 lb 12.2 oz (95.6 kg) IBW/kg (Calculated) : 77.6  Vital Signs: Temp: 98.7 F (37.1 C) (03/20 0407) Temp src: Oral (03/20 0407) BP: 94/67 mmHg (03/20 0407)  Labs:  Recent Labs  06/04/13 0330 06/05/13 0350 06/06/13 0430  HGB 13.4 13.7 14.7  HCT 39.6 41.7 45.3  PLT 121* 135* 152  LABPROT 17.9* 19.8* 20.9*  INR 1.52* 1.74* 1.86*  HEPARINUNFRC 0.40 0.42 0.82*  CREATININE 0.87 0.86 0.83    Estimated Creatinine Clearance: 141.9 ml/min (by C-G formula based on Cr of 0.83).   Medications:  Heparin 2800 units/hr  Assessment: 43 y/o M with LV thrombus on ECHO. HL is 0.82 this AM. Other labs as above. No issues per RN.   Goal of Therapy:  Heparin level 0.3-0.7 units/ml Monitor platelets by anticoagulation protocol: Yes   Plan:  -Decrease heparin drip to 2600 units/hr -1200 HL  -Daily CBC/HL -Monitor for bleeding  Abran Duke 06/06/2013,5:37 AM

## 2013-06-06 NOTE — Progress Notes (Signed)
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 Admitted with several week h/o HF symptoms. Denies strong FHx of CHF. No viral prodrome.   Echo reviewed and shows LVEF 10% with global HK, large LV thrombus and severe RV dysfunction.   Swan placed yesterday CVP 19  PA 41/25 (33)  PCWP 29  Thermo CO/CI 2.4/1.1  Initial co-ox 39% and started on milrinone 0.375 mcg. Milrinone weaned down to 0.25.   On 3/18 underwent coronary angio with normal coronaries. Low-dose lisinopril added yesterday - which he tolerated.  Started on Celexa for situational depression. Continues to diurese well on oral lasix with brisk u/o -6L again. Weight down to 199.   Feels much better. No dyspnea or SOB.  Co-ox 84%.  Blood type O pos    Objective:   Weight Range:  Vital Signs:   Temp:  [97.4 F (36.3 C)-98.7 F (37.1 C)] 98.7 F (37.1 C) (03/20 0407) Pulse Rate:  [102-105] 102 (03/19 1058) Resp:  [16-18] 16 (03/20 0407) BP: (94-110)/(65-81) 94/67 mmHg (03/20 0407) SpO2:  [91 %-99 %] 99 % (03/20 0407) Weight:  [90.6 kg (199 lb 11.8 oz)] 90.6 kg (199 lb 11.8 oz) (03/20 0500) Last BM Date: 06/05/13  Weight change: Filed Weights   06/04/13 0500 06/05/13 0538 06/06/13 0500  Weight: 99.066 kg (218 lb 6.4 oz) 95.6 kg (210 lb 12.2 oz) 90.6 kg (199 lb 11.8 oz)    Intake/Output:   Intake/Output Summary (Last 24 hours) at 06/06/13 0751 Last data filed at 06/06/13 0400  Gross per 24 hour  Intake  966.3 ml  Output   7100 ml  Net -6133.7 ml     Physical Exam: General:  Lying flat No resp difficulty HEENT: normal Neck: supple. CVP 7 Carotids 2+ bilat; no bruits. No lymphadenopathy or thryomegaly appreciated. Cor: PMI laterally displaced. Regular rate & rhythm. No S3 Lungs: clear Abdomen: soft, nontender, nondistended. No hepatosplenomegaly. No bruits or masses. Good bowel sounds. Extremities:  no cyanosis, clubbing, rash, no  edema Neuro: alert & orientedx3, cranial nerves grossly intact. moves all 4 extremities w/o difficulty. Affect pleasant  Telemetry: SR 90-100s Labs: Basic Metabolic Panel:  Recent Labs Lab 06/01/13 0029 06/01/13 0948  06/03/13 0443 06/03/13 1200 06/04/13 0330 06/05/13 0350 06/06/13 0430  NA 132*  --   < > 141 137 139 136* 135*  K 4.6  --   < > 2.8* 3.3* 3.5* 4.1 4.2  CL 95*  --   < > 97 93* 98 98 98  CO2 20  --   < > 32 33* 30 30 27   GLUCOSE 111*  --   < > 107* 107* 103* 167* 140*  BUN 28*  --   < > 29* 22 15 11 10   CREATININE 1.39*  --   < > 1.11 0.97 0.87 0.86 0.83  CALCIUM 9.1  --   < > 7.6* 7.4* 7.6* 7.7* 8.4  MG  --  1.8  --  1.9  --   --   --   --   < > = values in this interval not displayed.  Liver Function Tests:  Recent Labs Lab 05/30/13 1151 06/01/13 0029 06/02/13 0650 06/03/13 1200 06/06/13 0430  AST 98* 351* 2636* 768* 331*  ALT 157* 484* 2138* 1556* 1003*  ALKPHOS 67 82 114 107 150*  BILITOT 2.1* 1.7* 2.2* 1.8* 0.9  PROT 5.9* 5.9*  5.2* 5.0* 5.8*  ALBUMIN 3.6 3.3* 3.0* 2.6* 2.5*    Recent Labs Lab 05/30/13 1151  LIPASE 44.0   No results found for this basename: AMMONIA,  in the last 168 hours  CBC:  Recent Labs Lab 05/31/13 2353  06/02/13 0650 06/03/13 0443 06/04/13 0330 06/05/13 0350 06/06/13 0430  WBC 10.8*  < > 12.5* 12.3* 8.4 8.3 7.9  NEUTROABS 7.9*  --   --   --   --   --   --   HGB 15.7  < > 15.0 14.0 13.4 13.7 14.7  HCT 47.2  < > 43.9 41.2 39.6 41.7 45.3  MCV 90.6  < > 89.4 89.4 90.2 91.0 90.8  PLT 249  < > 198 171 121* 135* 152  < > = values in this interval not displayed.  Cardiac Enzymes:  Recent Labs Lab 06/01/13 0520 06/01/13 0948 06/01/13 1530  TROPONINI <0.30 <0.30 <0.30    BNP: BNP (last 3 results)  Recent Labs  06/01/13 0029  PROBNP 6513.0*    Imaging: No results found.   Medications:     Scheduled Medications: . citalopram  10 mg Oral Daily  . digoxin  0.25  mg Oral Daily  . furosemide  80 mg Oral BID  . lisinopril  2.5 mg Oral BID  . pantoprazole  40 mg Oral Daily  . potassium chloride  20 mEq Oral Daily  . sodium chloride  10-40 mL Intracatheter Q12H  . sodium chloride  3 mL Intravenous Q12H  . spironolactone  25 mg Oral Daily  . Warfarin - Pharmacist Dosing Inpatient   Does not apply q1800    Infusions: . heparin 2,600 Units/hr (06/06/13 0540)  . milrinone 0.25 mcg/kg/min (06/06/13 0046)    PRN Medications: sodium chloride, acetaminophen, ALPRAZolam, ALPRAZolam, dextromethorphan, docusate sodium, HYDROmorphone (DILAUDID) injection, ondansetron (ZOFRAN) IV, polyethylene glycol, sodium chloride, sodium chloride, sodium chloride, white petrolatum   Assessment:   1. Acute systolic HF with likely low-output physiology = cardiogenic shock  2. NICM 3. Shock kidney and liver  --acute renal and hepatic failure likely due to #1  4. ETOH use  5. Obesity 6. Hypokalemia 7. NSVT 8. Blood type - O positive.    Plan/Discussion:    Continues to improve. Now euvolemic. Co-ox much better. Able to tolerate low-dose lisinopril. Cut lasix to 40 daily for now. Decrease milrinone to 0.125.   Will transfer him to SDU and continue to watch him closely. No b-blocker yet.   On Celexa 10mg  daily for situational depression.   LifeVest order placed.    Length of Stay: 6 Arvilla Meres MD 06/06/2013, 7:51 AM  Advanced Heart Failure Team Pager [phone] (M-F; 7a - 4p)  Please contact [facility] for night-coverage after hours (4p -7a ) and weekends on [email or web address]

## 2013-06-06 NOTE — Progress Notes (Signed)
ANTICOAGULATION CONSULT NOTE - Follow Up Consult  Pharmacy Consult for Heparin / Warfarin Indication: LV Thrombus  No Known Allergies  Patient Measurements: Height: 6' (182.9 cm) Weight: 199 lb 11.8 oz (90.6 kg) IBW/kg (Calculated) : 77.6   Vital Signs: Temp: 98.2 F (36.8 C) (03/20 0735) Temp src: Oral (03/20 0735) BP: 88/64 mmHg (03/20 0735)  Labs:  Recent Labs  06/04/13 0330 06/05/13 0350 06/06/13 0430 06/06/13 0910  HGB 13.4 13.7 14.7  --   HCT 39.6 41.7 45.3  --   PLT 121* 135* 152  --   LABPROT 17.9* 19.8* 20.9*  --   INR 1.52* 1.74* 1.86*  --   HEPARINUNFRC 0.40 0.42 0.82* 0.76*  CREATININE 0.87 0.86 0.83  --     Estimated Creatinine Clearance: 129.9 ml/min (by C-G formula based on Cr of 0.83).     Assessment: 42yom admitted with new acute heart failure.  LV thrombus was found on ECHO.  Heparin drip was started for anticoagulation drrip rate 2800 uts/hr, Heparin level 0.42 at goal yesterday but big increase today 0.82 with no rate change - HL drawn from PICC line near heparin infusion - concern for false elevation of HL - recheck HL from peripheral stick opposite arm HL 0.76 - still > goal.  No bleeding noted, CBC stable.   Coumadin started 3/17 INR 1.8 today.    Goal of Therapy:  INR 2-3 Heparin level 0.3-0.7 units/ml Monitor platelets by anticoagulation protocol: Yes   Plan:  Decrease  Heparin drip 2550 uts/hr Coumadin 5mg  x1 today Daily Protime, Heparin level, CBC  Leota Sauers Pharm.D. CPP, BCPS Clinical Pharmacist [phone] 06/06/2013 10:14 AM

## 2013-06-07 LAB — BASIC METABOLIC PANEL
BUN: 10 mg/dL (ref 6–23)
CHLORIDE: 97 meq/L (ref 96–112)
CO2: 25 meq/L (ref 19–32)
Calcium: 8.8 mg/dL (ref 8.4–10.5)
Creatinine, Ser: 0.81 mg/dL (ref 0.50–1.35)
GFR calc Af Amer: >90 mL/min (ref >90)
GFR calc non Af Amer: >90 mL/min (ref >90)
GLUCOSE: 167 mg/dL — AB (ref 70–99)
Potassium: 4.3 mEq/L (ref 3.7–5.3)
Sodium: 135 mEq/L — ABNORMAL LOW (ref 137–147)

## 2013-06-07 LAB — CARBOXYHEMOGLOBIN
Carboxyhemoglobin: 1.4 % (ref 0.5–1.5)
METHEMOGLOBIN: 0.6 % (ref 0.0–1.5)
O2 Saturation: 65.7 %
TOTAL HEMOGLOBIN: 15.6 g/dL (ref 13.5–18.0)

## 2013-06-07 LAB — CBC
HEMATOCRIT: 46.8 % (ref 39.0–52.0)
HEMOGLOBIN: 15.4 g/dL (ref 13.0–17.0)
MCH: 29.9 pg (ref 26.0–34.0)
MCHC: 32.9 g/dL (ref 30.0–36.0)
MCV: 90.9 fL (ref 78.0–100.0)
Platelets: 175 10*3/uL (ref 150–400)
RBC: 5.15 MIL/uL (ref 4.22–5.81)
RDW: 13.3 % (ref 11.5–15.5)
WBC: 8.8 10*3/uL (ref 4.0–10.5)

## 2013-06-07 LAB — HEPARIN LEVEL (UNFRACTIONATED): HEPARIN UNFRACTIONATED: 0.64 [IU]/mL (ref 0.30–0.70)

## 2013-06-07 LAB — PROTIME-INR
INR: 2.68 — ABNORMAL HIGH (ref 0.00–1.49)
PROTHROMBIN TIME: 27.6 s — AB (ref 11.6–15.2)

## 2013-06-07 MED ORDER — WARFARIN SODIUM 3 MG PO TABS
3.0000 mg | ORAL_TABLET | Freq: Once | ORAL | Status: AC
  Administered 2013-06-07: 3 mg via ORAL
  Filled 2013-06-07: qty 1

## 2013-06-07 MED ORDER — WARFARIN SODIUM 4 MG PO TABS
4.0000 mg | ORAL_TABLET | Freq: Once | ORAL | Status: DC
  Filled 2013-06-07: qty 1

## 2013-06-07 MED ORDER — LISINOPRIL 5 MG PO TABS
5.0000 mg | ORAL_TABLET | Freq: Two times a day (BID) | ORAL | Status: DC
  Administered 2013-06-07 – 2013-06-09 (×5): 5 mg via ORAL
  Filled 2013-06-07 (×6): qty 1

## 2013-06-07 NOTE — Progress Notes (Signed)
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 Admitted with several week h/o HF symptoms. Denies strong FHx of CHF. No viral prodrome.   Echo  LVEF 10% with global HK, large LV thrombus and severe RV dysfunction.   Initial co-ox 39% and started on milrinone 0.375 mcg. On 3/18 underwent coronary angio with normal coronaries. Blood type O pos.   Milrinone weaned to 0.125 yesterday. Tolerating low-dose lisinopril. SBP mid 90s. Feels much better. Ambulating unit. No dyspnea or SOB. INR 2.7. Weight down to 198.   Co-ox 66%.  CVP 3    Objective:   Weight Range:  Vital Signs:   Temp:  [98 F (36.7 C)-98.5 F (36.9 C)] 98.5 F (36.9 C) (03/21 0805) Resp:  [16-18] 16 (03/21 0356) BP: (81-98)/(53-72) 96/62 mmHg (03/21 0805) SpO2:  [96 %] 96 % (03/21 0805) Weight:  [90.1 kg (198 lb 10.2 oz)] 90.1 kg (198 lb 10.2 oz) (03/21 0403) Last BM Date: 06/05/13  Weight change: Filed Weights   06/05/13 0538 06/06/13 0500 06/07/13 0403  Weight: 95.6 kg (210 lb 12.2 oz) 90.6 kg (199 lb 11.8 oz) 90.1 kg (198 lb 10.2 oz)    Intake/Output:   Intake/Output Summary (Last 24 hours) at 06/07/13 0908 Last data filed at 06/07/13 0800  Gross per 24 hour  Intake 2074.5 ml  Output   3800 ml  Net -1725.5 ml     Physical Exam: General:  Lying flat No resp difficulty HEENT: normal Neck: supple. CVP 3 Carotids 2+ bilat; no bruits. No lymphadenopathy or thryomegaly appreciated. Cor: PMI laterally displaced. Regular rate & rhythm. No S3 Lungs: clear Abdomen: soft, nontender, nondistended. No hepatosplenomegaly. No bruits or masses. Good bowel sounds. Extremities: no cyanosis, clubbing, rash, no  edema Neuro: alert & orientedx3, cranial nerves grossly intact. moves all 4 extremities w/o difficulty. Affect pleasant  Telemetry: SR 90-100s Labs: Basic Metabolic Panel:  Recent Labs Lab  06/01/13 0029 06/01/13 0948  06/03/13 0443 06/03/13 1200 06/04/13 0330 06/05/13 0350 06/06/13 0430 06/07/13 0450  NA 132*  --   < > 141 137 139 136* 135* 135*  K 4.6  --   < > 2.8* 3.3* 3.5* 4.1 4.2 4.3  CL 95*  --   < > 97 93* 98 98 98 97  CO2 20  --   < > 32 33* 30 30 27 25   GLUCOSE 111*  --   < > 107* 107* 103* 167* 140* 167*  BUN 28*  --   < > 29* 22 15 11 10 10   CREATININE 1.39*  --   < > 1.11 0.97 0.87 0.86 0.83 0.81  CALCIUM 9.1  --   < > 7.6* 7.4* 7.6* 7.7* 8.4 8.8  MG  --  1.8  --  1.9  --   --   --   --   --   < > = values in this interval not displayed.  Liver Function Tests:  Recent Labs Lab 06/01/13 0029 06/02/13 0650 06/03/13 1200 06/06/13 0430  AST 351* 2636* 768* 331*  ALT 484* 2138* 1556* 1003*  ALKPHOS 82 114 107 150*  BILITOT 1.7* 2.2* 1.8* 0.9  PROT 5.9* 5.2* 5.0* 5.8*  ALBUMIN 3.3* 3.0* 2.6* 2.5*   No results found for this basename: LIPASE, AMYLASE,  in the last 168 hours No results found for this basename: AMMONIA,  in the last 168 hours  CBC:  Recent Labs Lab 05/31/13 2353  06/03/13 0443 06/04/13 0330 06/05/13 0350 06/06/13 0430 06/07/13 0450  WBC 10.8*  < > 12.3* 8.4 8.3 7.9 8.8  NEUTROABS 7.9*  --   --   --   --   --   --   HGB 15.7  < > 14.0 13.4 13.7 14.7 15.4  HCT 47.2  < > 41.2 39.6 41.7 45.3 46.8  MCV 90.6  < > 89.4 90.2 91.0 90.8 90.9  PLT 249  < > 171 121* 135* 152 175  < > = values in this interval not displayed.  Cardiac Enzymes:  Recent Labs Lab 06/01/13 0520 06/01/13 0948 06/01/13 1530  TROPONINI <0.30 <0.30 <0.30    BNP: BNP (last 3 results)  Recent Labs  06/01/13 0029  PROBNP 6513.0*    Imaging: No results found.   Medications:     Scheduled Medications: . citalopram  10 mg Oral Daily  . digoxin  0.25 mg Oral Daily  . furosemide  40 mg Oral Daily  . lisinopril  2.5 mg Oral BID  . pantoprazole  40 mg Oral Daily  . polyethylene glycol  17 g Oral Daily  . sodium chloride  10-40 mL  Intracatheter Q12H  . sodium chloride  3 mL Intravenous Q12H  . spironolactone  25 mg Oral Daily  . warfarin  4 mg Oral ONCE-1800  . Warfarin - Pharmacist Dosing Inpatient   Does not apply q1800    Infusions: . heparin 2,550 Units/hr (06/07/13 0018)  . milrinone 0.125 mcg/kg/min (06/06/13 1425)    PRN Medications: sodium chloride, acetaminophen, ALPRAZolam, ALPRAZolam, dextromethorphan, docusate sodium, HYDROmorphone (DILAUDID) injection, ondansetron (ZOFRAN) IV, sodium chloride, sodium chloride, sodium chloride, white petrolatum   Assessment:   1. Acute systolic HF with likely low-output physiology = cardiogenic shock  2. NICM 3. Shock kidney and liver  --acute renal and hepatic failure likely due to #1  4. ETOH use  5. Obesity 6. Hypokalemia 7. NSVT 8. Blood type - O positive.    Plan/Discussion:    Continues to improve. CVP low.  Co-ox much better. Able to tolerate low-dose lisinopril. Will hold lasix today. INR therapeutic for LV clot. Can stop heparin.  Continue milrinone at 0.125. Will try to wean off tomorrow. Will try to edge up lisinopril gently starting with tonight's dose.   Will transfer him to SDU and continue to watch him closely. No b-blocker yet.   On Celexa 10mg  daily for situational depression.   LifeVest order placed.    Length of Stay: 7 Arvilla Meres MD 06/07/2013, 9:08 AM  Advanced Heart Failure Team Pager [phone] (M-F; 7a - 4p)  Please contact [facility] for night-coverage after hours (4p -7a ) and weekends on [email or web address]

## 2013-06-07 NOTE — Progress Notes (Addendum)
ANTICOAGULATION CONSULT NOTE - Follow Up Consult  Pharmacy Consult for Heparin / Warfarin Indication: LV Thrombus  No Known Allergies  Patient Measurements: Height: 6' (182.9 cm) Weight: 198 lb 10.2 oz (90.1 kg) IBW/kg (Calculated) : 77.6   Vital Signs: Temp: 98 F (36.7 C) (03/21 0356) Temp src: Oral (03/21 0356) BP: 98/71 mmHg (03/21 0356)  Labs:  Recent Labs  06/05/13 0350 06/06/13 0430 06/06/13 0910 06/07/13 0450  HGB 13.7 14.7  --  15.4  HCT 41.7 45.3  --  46.8  PLT 135* 152  --  175  LABPROT 19.8* 20.9*  --  27.6*  INR 1.74* 1.86*  --  2.68*  HEPARINUNFRC 0.42 0.82* 0.76* 0.64  CREATININE 0.86 0.83  --  0.81    Estimated Creatinine Clearance: 133.1 ml/min (by C-G formula based on Cr of 0.81).   Assessment: 42yom admitted with new acute heart failure.  LV thrombus was found on ECHO.  Heparin drip was started for anticoagulation drip rate 2800 uts/hr, Heparin level 0.42 at goal on 3/19, but big increase yesterday to 0.82 with no rate change - HL drawn from PICC line near heparin infusion - concern for false elevation of HL.  On recheck HL from peripheral stick opposite arm HL 0.76 - still > goal.  HL this AM on decreased dose is 0.64 and within goal.  No bleeding noted, CBC stable.    Coumadin started on 3/17 INR is 2.68 today, a large increase from 1.8, will slightly decrease dose today.    Goal of Therapy:  INR 2-3 Heparin level 0.3-0.7 units/ml Monitor platelets by anticoagulation protocol: Yes   Plan:  Continue Heparin drip at 2550 units/hr for 24hrs overlap with INR in goal Coumadin 3mg  x1 today Daily Protime, Heparin level, CBC  Anabel Bene, PharmD Clinical Pharmacist Pager: [phone]  06/07/2013 7:18 AM

## 2013-06-08 LAB — HEMOGLOBIN A1C
Hgb A1c MFr Bld: 5.9 % — ABNORMAL HIGH (ref <5.7)
Mean Plasma Glucose: 123 mg/dL — ABNORMAL HIGH (ref <117)

## 2013-06-08 LAB — BASIC METABOLIC PANEL
BUN: 10 mg/dL (ref 6–23)
CHLORIDE: 97 meq/L (ref 96–112)
CO2: 25 meq/L (ref 19–32)
CREATININE: 0.81 mg/dL (ref 0.50–1.35)
Calcium: 9 mg/dL (ref 8.4–10.5)
GFR calc non Af Amer: >90 mL/min (ref >90)
Glucose, Bld: 158 mg/dL — ABNORMAL HIGH (ref 70–99)
Potassium: 4.6 mEq/L (ref 3.7–5.3)
Sodium: 133 mEq/L — ABNORMAL LOW (ref 137–147)

## 2013-06-08 LAB — CARBOXYHEMOGLOBIN
Carboxyhemoglobin: 1.5 % (ref 0.5–1.5)
Carboxyhemoglobin: 1.5 % (ref 0.5–1.5)
Methemoglobin: 0.6 % (ref 0.0–1.5)
Methemoglobin: 0.6 % (ref 0.0–1.5)
O2 Saturation: 69.6 %
O2 Saturation: 62.8 %
Total hemoglobin: 15.7 g/dL (ref 13.5–18.0)
Total hemoglobin: 16.2 g/dL (ref 13.5–18.0)

## 2013-06-08 LAB — CBC
HCT: 47.2 % (ref 39.0–52.0)
Hemoglobin: 16.3 g/dL (ref 13.0–17.0)
MCH: 31 pg (ref 26.0–34.0)
MCHC: 34.5 g/dL (ref 30.0–36.0)
MCV: 89.9 fL (ref 78.0–100.0)
Platelets: 180 10*3/uL (ref 150–400)
RBC: 5.25 MIL/uL (ref 4.22–5.81)
RDW: 13.4 % (ref 11.5–15.5)
WBC: 7.9 10*3/uL (ref 4.0–10.5)

## 2013-06-08 LAB — PROTIME-INR
INR: 2.8 — ABNORMAL HIGH (ref 0.00–1.49)
PROTHROMBIN TIME: 28.5 s — AB (ref 11.6–15.2)

## 2013-06-08 LAB — HEPARIN LEVEL (UNFRACTIONATED): HEPARIN UNFRACTIONATED: 0.87 [IU]/mL — AB (ref 0.30–0.70)

## 2013-06-08 MED ORDER — CARVEDILOL 3.125 MG PO TABS
3.1250 mg | ORAL_TABLET | Freq: Two times a day (BID) | ORAL | Status: DC
  Administered 2013-06-08 – 2013-06-09 (×3): 3.125 mg via ORAL
  Filled 2013-06-08 (×4): qty 1

## 2013-06-08 MED ORDER — SPIRONOLACTONE 12.5 MG HALF TABLET: 12.5000 mg | ORAL_TABLET | Freq: Every day | ORAL | Status: DC

## 2013-06-08 MED ORDER — SPIRONOLACTONE 12.5 MG HALF TABLET
12.5000 mg | ORAL_TABLET | Freq: Every day | ORAL | Status: DC
Start: 2013-06-08 — End: 2013-06-09
  Administered 2013-06-08 – 2013-06-09 (×2): 12.5 mg via ORAL
  Filled 2013-06-08 (×2): qty 1

## 2013-06-08 MED ORDER — WARFARIN SODIUM 3 MG PO TABS
3.0000 mg | ORAL_TABLET | Freq: Once | ORAL | Status: AC
  Administered 2013-06-08: 3 mg via ORAL
  Filled 2013-06-08: qty 1

## 2013-06-08 NOTE — Progress Notes (Signed)
ANTICOAGULATION CONSULT NOTE - Follow Up Consult  Pharmacy Consult for Heparin / Warfarin Indication: LV Thrombus  No Known Allergies  Patient Measurements: Height: 6' (182.9 cm) Weight: 194 lb 10.7 oz (88.3 kg) IBW/kg (Calculated) : 77.6   Vital Signs: Temp: 98.4 F (36.9 C) (03/22 0400) Temp src: Oral (03/22 0400) BP: 101/68 mmHg (03/21 2316) Pulse Rate: 95 (03/22 0400)  Labs:  Recent Labs  06/06/13 0430 06/06/13 0910 06/07/13 0450 06/08/13 0400  HGB 14.7  --  15.4 16.3  HCT 45.3  --  46.8 47.2  PLT 152  --  175 180  LABPROT 20.9*  --  27.6* 28.5*  INR 1.86*  --  2.68* 2.80*  HEPARINUNFRC 0.82* 0.76* 0.64 0.87*  CREATININE 0.83  --  0.81 0.81    Estimated Creatinine Clearance: 133.1 ml/min (by C-G formula based on Cr of 0.81).   Assessment: 42yom admitted with new acute heart failure.  LV thrombus was found on ECHO.  Heparin drip was started for anticoagulation, with previous concerns for HL drawn from PICC line near heparin infusion - concern for false elevation of HL.  On recheck HL from peripheral stick opposite arm HL 0.76 and still greater than goal.  HL this AM on decreased dose is elevated at 0.87.  No bleeding noted, CBC stable.    Coumadin started on 3/17 INR is 2.8 today, and pt is 24 hours post therapeutic INR.  Will stop heparin today due to therapeutic INR again this AM.     Goal of Therapy:  INR 2-3 Heparin level 0.3-0.7 units/ml Monitor platelets by anticoagulation protocol: Yes   Plan:  Stop Heparin today Continue coumadin 3mg  x1 today Daily Protime, Heparin level, CBC  Anabel Bene, PharmD Clinical Pharmacist Pager: [phone]  06/08/2013 7:27 AM

## 2013-06-08 NOTE — Progress Notes (Signed)
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 Admitted with several week h/o HF symptoms. Denies strong FHx of CHF. No viral prodrome.   Echo  LVEF 10% with global HK, large LV thrombus and severe RV dysfunction.   Initial co-ox 39% and started on milrinone 0.375 mcg. On 3/18 underwent coronary angio with normal coronaries. Blood type O pos.   Remains on milrinone. Tolerated titration of lisinopril to 5 bid yesterday. SBP 95-101.  Feels much better. Ambulating unit. No dyspnea or SOB. INR 2.8. Weight down to 194 without lasix.   Co-ox 69%.  CVP 1    Objective:   Weight Range:  Vital Signs:   Temp:  [97.8 F (36.6 C)-99 F (37.2 C)] 98.4 F (36.9 C) (03/22 0400) Pulse Rate:  [95-104] 95 (03/22 0400) BP: (95-101)/(66-68) 101/68 mmHg (03/21 2316) SpO2:  [95 %-96 %] 95 % (03/22 0400) Weight:  [88.3 kg (194 lb 10.7 oz)] 88.3 kg (194 lb 10.7 oz) (03/22 0600) Last BM Date: 06/05/13  Weight change: Filed Weights   06/06/13 0500 06/07/13 0403 06/08/13 0600  Weight: 90.6 kg (199 lb 11.8 oz) 90.1 kg (198 lb 10.2 oz) 88.3 kg (194 lb 10.7 oz)    Intake/Output:   Intake/Output Summary (Last 24 hours) at 06/08/13 0844 Last data filed at 06/08/13 0800  Gross per 24 hour  Intake 2312.1 ml  Output   4350 ml  Net -2037.9 ml     Physical Exam: General:  Lying flat No resp difficulty HEENT: normal Neck: supple. CVP flat Carotids 2+ bilat; no bruits. No lymphadenopathy or thryomegaly appreciated. Cor: PMI laterally displaced. Regular rate & rhythm. No S3 Lungs: clear Abdomen: soft, nontender, nondistended. No hepatosplenomegaly. No bruits or masses. Good bowel sounds. Extremities: no cyanosis, clubbing, rash, no  edema Neuro: alert & orientedx3, cranial nerves grossly intact. moves all 4 extremities w/o difficulty. Affect pleasant  Telemetry: SR 90-100s Labs: Basic Metabolic  Panel:  Recent Labs Lab 06/01/13 0948  06/03/13 0443  06/04/13 0330 06/05/13 0350 06/06/13 0430 06/07/13 0450 06/08/13 0400  NA  --   < > 141  < > 139 136* 135* 135* 133*  K  --   < > 2.8*  < > 3.5* 4.1 4.2 4.3 4.6  CL  --   < > 97  < > 98 98 98 97 97  CO2  --   < > 32  < > 30 30 27 25 25   GLUCOSE  --   < > 107*  < > 103* 167* 140* 167* 158*  BUN  --   < > 29*  < > 15 11 10 10 10   CREATININE  --   < > 1.11  < > 0.87 0.86 0.83 0.81 0.81  CALCIUM  --   < > 7.6*  < > 7.6* 7.7* 8.4 8.8 9.0  MG 1.8  --  1.9  --   --   --   --   --   --   < > = values in this interval not displayed.  Liver Function Tests:  Recent Labs Lab 06/02/13 0650 06/03/13 1200 06/06/13 0430  AST 2636* 768* 331*  ALT 2138* 1556* 1003*  ALKPHOS 114 107 150*  BILITOT 2.2* 1.8* 0.9  PROT 5.2* 5.0* 5.8*  ALBUMIN 3.0* 2.6* 2.5*   No results found for this basename: LIPASE, AMYLASE,  in the last 168  hours No results found for this basename: AMMONIA,  in the last 168 hours  CBC:  Recent Labs Lab 06/04/13 0330 06/05/13 0350 06/06/13 0430 06/07/13 0450 06/08/13 0400  WBC 8.4 8.3 7.9 8.8 7.9  HGB 13.4 13.7 14.7 15.4 16.3  HCT 39.6 41.7 45.3 46.8 47.2  MCV 90.2 91.0 90.8 90.9 89.9  PLT 121* 135* 152 175 180    Cardiac Enzymes:  Recent Labs Lab 06/01/13 0948 06/01/13 1530  TROPONINI <0.30 <0.30    BNP: BNP (last 3 results)  Recent Labs  06/01/13 0029  PROBNP 6513.0*    Imaging: No results found.   Medications:     Scheduled Medications: . citalopram  10 mg Oral Daily  . digoxin  0.25 mg Oral Daily  . lisinopril  5 mg Oral BID  . pantoprazole  40 mg Oral Daily  . polyethylene glycol  17 g Oral Daily  . sodium chloride  10-40 mL Intracatheter Q12H  . sodium chloride  3 mL Intravenous Q12H  . spironolactone  25 mg Oral Daily  . warfarin  3 mg Oral ONCE-1800  . Warfarin - Pharmacist Dosing Inpatient   Does not apply q1800    Infusions: . milrinone 0.125 mcg/kg/min (06/07/13  2300)    PRN Medications: sodium chloride, acetaminophen, ALPRAZolam, ALPRAZolam, dextromethorphan, docusate sodium, HYDROmorphone (DILAUDID) injection, ondansetron (ZOFRAN) IV, sodium chloride, sodium chloride, sodium chloride, white petrolatum   Assessment:   1. Acute systolic HF with likely low-output physiology = cardiogenic shock  2. NICM 3. Shock kidney and liver  --acute renal and hepatic failure likely due to #1  4. ETOH use  5. Obesity 6. Hypokalemia 7. NSVT 8. Blood type - O positive.    Plan/Discussion:    Continues to improve. Will stop milrinone today. Continue low-dose lisinopril. No lasix. Check co-ox this afternoon. INR therapeutic for LV clot. Can stop heparin.  Possibly start carvedilol in am.  CBGs in am ~140. Check HgBA1c.   Will transfer him to SDU and continue to watch him closely.   On Celexa 10mg  daily for situational depression.   LifeVest order placed.    Length of Stay: 8 Arvilla Meres MD 06/08/2013, 8:44 AM  Advanced Heart Failure Team Pager [phone] (M-F; 7a - 4p)  Please contact [facility] for night-coverage after hours (4p -7a ) and weekends on [email or web address]

## 2013-06-08 NOTE — Progress Notes (Signed)
Resp Care Note; Cpap on hold for tonight per RN.

## 2013-06-09 LAB — BASIC METABOLIC PANEL
BUN: 13 mg/dL (ref 6–23)
CALCIUM: 9.6 mg/dL (ref 8.4–10.5)
CO2: 25 meq/L (ref 19–32)
Chloride: 98 mEq/L (ref 96–112)
Creatinine, Ser: 0.86 mg/dL (ref 0.50–1.35)
GFR calc Af Amer: >90 mL/min (ref >90)
GFR calc non Af Amer: >90 mL/min (ref >90)
Glucose, Bld: 90 mg/dL (ref 70–99)
Potassium: 4.9 mEq/L (ref 3.7–5.3)
Sodium: 135 mEq/L — ABNORMAL LOW (ref 137–147)

## 2013-06-09 LAB — CBC
HCT: 49.9 % (ref 39.0–52.0)
Hemoglobin: 17 g/dL (ref 13.0–17.0)
MCH: 30.6 pg (ref 26.0–34.0)
MCHC: 34.1 g/dL (ref 30.0–36.0)
MCV: 89.7 fL (ref 78.0–100.0)
Platelets: 229 10*3/uL (ref 150–400)
RBC: 5.56 MIL/uL (ref 4.22–5.81)
RDW: 13.4 % (ref 11.5–15.5)
WBC: 9.5 10*3/uL (ref 4.0–10.5)

## 2013-06-09 LAB — CARBOXYHEMOGLOBIN
Carboxyhemoglobin: 1.4 % (ref 0.5–1.5)
Methemoglobin: 0.6 % (ref 0.0–1.5)
O2 Saturation: 63.4 %
Total hemoglobin: 16.8 g/dL (ref 13.5–18.0)

## 2013-06-09 LAB — PROTIME-INR
INR: 2.55 — AB (ref 0.00–1.49)
PROTHROMBIN TIME: 26.6 s — AB (ref 11.6–15.2)

## 2013-06-09 MED ORDER — PATIENT'S GUIDE TO USING COUMADIN BOOK
Freq: Once | Status: DC
  Filled 2013-06-09: qty 1

## 2013-06-09 MED ORDER — CARVEDILOL 3.125 MG PO TABS: 3.1250 mg | ORAL_TABLET | Freq: Two times a day (BID) | ORAL | Status: DC

## 2013-06-09 MED ORDER — DIGOXIN 250 MCG PO TABS: 0.2500 mg | ORAL_TABLET | Freq: Every day | ORAL | Status: DC

## 2013-06-09 MED ORDER — WARFARIN SODIUM 4 MG PO TABS
4.0000 mg | ORAL_TABLET | Freq: Once | ORAL | Status: AC
  Administered 2013-06-09: 4 mg via ORAL
  Filled 2013-06-09: qty 1

## 2013-06-09 MED ORDER — SPIRONOLACTONE 25 MG PO TABS
12.5000 mg | ORAL_TABLET | Freq: Every day | ORAL | Status: DC
Start: 2013-06-09 — End: 2013-06-30

## 2013-06-09 MED ORDER — CITALOPRAM HYDROBROMIDE 10 MG PO TABS: 10.0000 mg | ORAL_TABLET | Freq: Every day | ORAL | Status: DC

## 2013-06-09 MED ORDER — WARFARIN SODIUM 4 MG PO TABS: 4.0000 mg | ORAL_TABLET | Freq: Every day | ORAL | Status: DC

## 2013-06-09 MED ORDER — SPIRONOLACTONE 12.5 MG HALF TABLET: 12.5000 mg | ORAL_TABLET | Freq: Every day | ORAL | Status: DC

## 2013-06-09 MED ORDER — LISINOPRIL 5 MG PO TABS: 5.0000 mg | ORAL_TABLET | Freq: Two times a day (BID) | ORAL | Status: DC

## 2013-06-09 NOTE — Consult Note (Addendum)
Heart Failure Navigator Consult Note  Presentation: Troy NettersJonathan Manning is a  43 y.o. male who was previously healthy until 1 month ago when he began to have swelling of both of his lower legs and feet. The swelling progressed and he reports the he began to have swelling of his ABD . He reports that 1 week ago he noticed that he had an uninentional weight gain of 5 pounds. He began to have worsening SOB and DOE this week. He denies having any chest pain. He saw his PCP and was sent for an Ultrasound of the ABD 1 day ago which found Ascites and Hepatomegaly. He came into the ED tonight due to worsening SOB, and pain in his legs and feet from the swelling.  In the ED, he was evaluated and was found to have a BNP= 6513.0 and a Chest X-ray which revealed Fluid Overload, cardiomegaly, and small bilateral effusions. He was administered IV lasix x 1 and began to diurese and feel better. He was referred for medical admission.  Cardiac cath on 3/18 revealed -  Assessment:  1. Normal coronary arteries  2. Severe NICM   Past Medical History  Diagnosis Date  . Hypertension   . Athlete's foot     History   Social History  . Marital Status: Married    Spouse Name: N/A    Number of Children: 0  . Years of Education: N/A   Occupational History  . Telephone    Social History Main Topics  . Smoking status: Never Smoker   . Smokeless tobacco: None  . Alcohol Use: Yes     Comment: rare  . Drug Use: None  . Sexual Activity: None   Other Topics Concern  . None   Social History Narrative  . None    ECHO:Study Conclusions-- 06/01/13  - Left ventricle: The cavity size was severely dilated. Wall thickness was normal. The estimated ejection fraction was 10%. Diffuse hypokinesis. Doppler parameters are consistent with restrictive physiology, indicative of decreased left ventricular diastolic compliance and/or increased left atrial pressure. There was a large, 14mm (L) x 26mm (W), flat  (mural)thrombus. - Aortic valve: Trivial regurgitation. Valve area: 3.29cm^2 (Vmax). - Mitral valve: Mild regurgitation. - Left atrium: The atrium was severely dilated. - Right atrium: The atrium was mildly dilated. - Tricuspid valve: Moderate regurgitation. Impressions:  - Severe dilated cardiomyopathy. LV apical mural thrombus   BNP    Component Value Date/Time   PROBNP 6513.0* 06/01/2013 0029    Education Assessment and Provision:  Detailed education and instructions provided on heart failure disease management including the following:  Signs and symptoms of Heart Failure When to call the physician Importance of daily weights Low sodium diet  Fluid restriction Medication management Anticipated future follow-up appointments  Patient education given on each of the above topics.  Patient and wife acknowledge understanding and acceptance of all instructions.  He and his wife asked several pertinent questions and were able to teach back all topics above.  I will request Dietician to speak them today before discharge in order to reinforce low sodium dietary recommendations.  Education Materials:  "Living Better With Heart Failure" Booklet, Daily Weight Tracker Tool and Heart Failure Educational Video.   High Risk Criteria for Readmission and/or Poor Patient Outcomes:   EF <30%- Yes -10%  2 or more admissions in 6 months-No  Difficult social situation-No  Demonstrates medication noncompliance-No    Barriers of Care:  This is a brand new diagnosis to the patient  and his wife.  They will need continued education regarding his new medical condition.  They have received some support from her cousin who has undergone a heart transplant at [facility].  Discharge Planning:   Patient will discharge to home with wife.  His parents are involved as well and supportive.  He will need structured continued follow-up and compliance reinforcement.

## 2013-06-09 NOTE — Progress Notes (Signed)
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 Admitted with several week h/o HF symptoms. Denies strong FHx of CHF. No viral prodrome.   Echo  LVEF 10% with global HK, large LV thrombus and severe RV dysfunction.   Initial co-ox 39% and started on milrinone 0.375 mcg. On 3/18 underwent coronary angio with normal coronaries. Blood type O pos.   Yesterday Milrinone was stopped and low dose carvedilol added. Denies SOB    Co-ox 69% on Milrinone 0.125 mcg> 63 % off Milrinone INR 2.55    Objective:   Weight Range:  Vital Signs:   Temp:  [97.4 F (36.3 C)-98.4 F (36.9 C)] 97.6 F (36.4 C) (03/23 0744) Pulse Rate:  [112] 112 (03/22 1207) Resp:  [15-18] 18 (03/23 0744) BP: (87-109)/(62-81) 100/78 mmHg (03/23 0744) SpO2:  [94 %-98 %] 96 % (03/23 0744) Weight:  [191 lb 5.8 oz (86.8 kg)] 191 lb 5.8 oz (86.8 kg) (03/23 0448) Last BM Date: 06/05/13  Weight change: Filed Weights   06/07/13 0403 06/08/13 0600 06/09/13 0448  Weight: 198 lb 10.2 oz (90.1 kg) 194 lb 10.7 oz (88.3 kg) 191 lb 5.8 oz (86.8 kg)    Intake/Output:   Intake/Output Summary (Last 24 hours) at 06/09/13 0839 Last data filed at 06/09/13 0500  Gross per 24 hour  Intake 1096.06 ml  Output   4650 ml  Net -3553.94 ml     Physical Exam: General:  Lying flat No resp difficulty HEENT: normal Neck: supple. CVP flat Carotids 2+ bilat; no bruits. No lymphadenopathy or thryomegaly appreciated. Cor: PMI laterally displaced. Regular rate & rhythm. No S3 Lungs: clear Abdomen: soft, nontender, nondistended. No hepatosplenomegaly. No bruits or masses. Good bowel sounds. Extremities: no cyanosis, clubbing, rash, no  edema Neuro: alert & orientedx3, cranial nerves grossly intact. moves all 4 extremities w/o difficulty. Affect pleasant  Telemetry: SR 90-100s Labs: Basic Metabolic Panel:  Recent Labs Lab  06/03/13 0443  06/05/13 0350 06/06/13 0430 06/07/13 0450 06/08/13 0400 06/09/13 0456  NA 141  < > 136* 135* 135* 133* 135*  K 2.8*  < > 4.1 4.2 4.3 4.6 4.9  CL 97  < > 98 98 97 97 98  CO2 32  < > 30 27 25 25 25   GLUCOSE 107*  < > 167* 140* 167* 158* 90  BUN 29*  < > 11 10 10 10 13   CREATININE 1.11  < > 0.86 0.83 0.81 0.81 0.86  CALCIUM 7.6*  < > 7.7* 8.4 8.8 9.0 9.6  MG 1.9  --   --   --   --   --   --   < > = values in this interval not displayed.  Liver Function Tests:  Recent Labs Lab 06/03/13 1200 06/06/13 0430  AST 768* 331*  ALT 1556* 1003*  ALKPHOS 107 150*  BILITOT 1.8* 0.9  PROT 5.0* 5.8*  ALBUMIN 2.6* 2.5*   No results found for this basename: LIPASE, AMYLASE,  in the last 168 hours No results found for this basename: AMMONIA,  in the last 168 hours  CBC:  Recent Labs Lab 06/05/13 0350 06/06/13 0430 06/07/13 0450 06/08/13 0400 06/09/13 0456  WBC 8.3 7.9 8.8 7.9 9.5  HGB 13.7 14.7 15.4 16.3 17.0  HCT 41.7 45.3 46.8 47.2 49.9  MCV 91.0 90.8 90.9 89.9 89.7  PLT 135* 152 175 180 229    Cardiac  Enzymes: No results found for this basename: CKTOTAL, CKMB, CKMBINDEX, TROPONINI,  in the last 168 hours  BNP: BNP (last 3 results)  Recent Labs  06/01/13 0029  PROBNP 6513.0*    Imaging: No results found.   Medications:     Scheduled Medications: . carvedilol  3.125 mg Oral BID WC  . citalopram  10 mg Oral Daily  . digoxin  0.25 mg Oral Daily  . lisinopril  5 mg Oral BID  . pantoprazole  40 mg Oral Daily  . polyethylene glycol  17 g Oral Daily  . sodium chloride  10-40 mL Intracatheter Q12H  . sodium chloride  3 mL Intravenous Q12H  . spironolactone  12.5 mg Oral Daily  . Warfarin - Pharmacist Dosing Inpatient   Does not apply q1800    Infusions:    PRN Medications: sodium chloride, acetaminophen, ALPRAZolam, ALPRAZolam, dextromethorphan, docusate sodium, HYDROmorphone (DILAUDID) injection, ondansetron (ZOFRAN) IV, sodium chloride,  sodium chloride, sodium chloride, white petrolatum   Assessment:   1. Acute systolic HF with likely low-output physiology = cardiogenic shock  2. NICM 3. Shock kidney and liver  --acute renal and hepatic failure likely due to #1  4. ETOH use  5. Obesity 6. Hypokalemia 7. NSVT 8. Blood type - O positive.    Plan/Discussion:    Stable off Milrinone. CO-OX 63%. Continue low-dose lisinopril and low dose carvedilol 3.125 mg twice a day. Lifevest to be placed today.    INR therapeutic for LV clot. Set up at [facility].    Hgb A1c 5.9   On Celexa 10mg  daily for situational depression.   Follow up in HF clinic next week.   Length of Stay: 9 CLEGG,AMY NP-C  06/09/2013, 8:39 AM  Advanced Heart Failure Team Pager [phone] (M-F; 7a - 4p)  Please contact [facility] for night-coverage after hours (4p -7a ) and weekends on [email or web address]   Patient seen and examined with Tonye Becket, NP. We discussed all aspects of the encounter. I agree with the assessment and plan as stated above.   Much improved. HF teaching provided to patient and wife. LifeVest to be placed today Home this evening or tomorrow am.   Truman Hayward 9:06 AM

## 2013-06-09 NOTE — Progress Notes (Signed)
ANTICOAGULATION CONSULT NOTE - Follow Up Consult  Pharmacy Consult for Warfarin Indication: LV Thrombus  No Known Allergies  Patient Measurements: Height: 6' (182.9 cm) Weight: 191 lb 5.8 oz (86.8 kg) IBW/kg (Calculated) : 77.6   Vital Signs: Temp: 98.1 F (36.7 C) (03/23 1129) Temp src: Oral (03/23 1129) BP: 107/71 mmHg (03/23 1129)  Labs:  Recent Labs  06/07/13 0450 06/08/13 0400 06/09/13 0456  HGB 15.4 16.3 17.0  HCT 46.8 47.2 49.9  PLT 175 180 229  LABPROT 27.6* 28.5* 26.6*  INR 2.68* 2.80* 2.55*  HEPARINUNFRC 0.64 0.87*  --   CREATININE 0.81 0.81 0.86    Estimated Creatinine Clearance: 125.3 ml/min (by C-G formula based on Cr of 0.86).   Assessment: 42yom admitted with new acute heart failure.  LV thrombus was found on ECHO.  Was on heparin gtt until yesterday, INR now therapeutic.  CBC stable.  Coumadin started on 3/17 INR is 2.55 today.  No bleeding or complications noted.  Reinforced Coumadin education with patient and wife today.  Goal of Therapy:  INR 2-3 Heparin level 0.3-0.7 units/ml Monitor platelets by anticoagulation protocol: Yes   Plan:  Coumadin 4 mg x1 today Daily Protime, Heparin level, CBC  Tad Moore, BCPS  Clinical Pharmacist Pager [phone]  06/09/2013 2:19 PM

## 2013-06-09 NOTE — Discharge Instructions (Signed)
Information on my medicine - Coumadin   (Warfarin)  This medication education was reviewed with me or my healthcare representative as part of my discharge preparation.  The pharmacist that spoke with me during my hospital stay was:  Anabel Bene, [facility]  Why was Coumadin prescribed for you? Coumadin was prescribed for you because you have a blood clot or a medical condition that can cause an increased risk of forming blood clots. Blood clots can cause serious health problems by blocking the flow of blood to the heart, lung, or brain. Coumadin can prevent harmful blood clots from forming. As a reminder your indication for Coumadin is: (LV Thrombosis)  Deep Vein Thrombosis Treatment  What test will check on my response to Coumadin? While on Coumadin (warfarin) you will need to have an INR test regularly to ensure that your dose is keeping you in the desired range. The INR (international normalized ratio) number is calculated from the result of the laboratory test called prothrombin time (PT).  If an INR APPOINTMENT HAS NOT ALREADY BEEN MADE FOR YOU please schedule an appointment to have this lab work done by your health care provider within 7 days. Your INR goal is usually a number between:  2 to 3 or your provider may give you a more narrow range like 2-2.5.  Ask your health care provider during an office visit what your goal INR is.  What  do you need to  know  About  COUMADIN? Take Coumadin (warfarin) exactly as prescribed by your healthcare provider about the same time each day.  DO NOT stop taking without talking to the doctor who prescribed the medication.  Stopping without other blood clot prevention medication to take the place of Coumadin may increase your risk of developing a new clot or stroke.  Get refills before you run out.  What do you do if you miss a dose? If you miss a dose, take it as soon as you remember on the same day then continue your regularly scheduled regimen the next day.   Do not take two doses of Coumadin at the same time.  Important Safety Information A possible side effect of Coumadin (Warfarin) is an increased risk of bleeding. You should call your healthcare provider right away if you experience any of the following:   Bleeding from an injury or your nose that does not stop.   Unusual colored urine (red or dark brown) or unusual colored stools (red or black).   Unusual bruising for unknown reasons.   A serious fall or if you hit your head (even if there is no bleeding).  Some foods or medicines interact with Coumadin (warfarin) and might alter your response to warfarin. To help avoid this:   Eat a balanced diet, maintaining a consistent amount of Vitamin K.   Notify your provider about major diet changes you plan to make.   Avoid alcohol or limit your intake to 1 drink for women and 2 drinks for men per day. (1 drink is 5 oz. wine, 12 oz. beer, or 1.5 oz. liquor.)  Make sure that ANY health care provider who prescribes medication for you knows that you are taking Coumadin (warfarin).  Also make sure the healthcare provider who is monitoring your Coumadin knows when you have started a new medication including herbals and non-prescription products.  Coumadin (Warfarin)  Major Drug Interactions  Increased Warfarin Effect Decreased Warfarin Effect  Alcohol (large quantities) Antibiotics (esp. Septra/Bactrim, Flagyl, Cipro) Amiodarone (Cordarone) Aspirin (ASA) Cimetidine (  Tagamet) Megestrol (Megace) NSAIDs (ibuprofen, naproxen, etc.) Piroxicam (Feldene) Propafenone (Rythmol SR) Propranolol (Inderal) Isoniazid (INH) Posaconazole (Noxafil) Barbiturates (Phenobarbital) Carbamazepine (Tegretol) Chlordiazepoxide (Librium) Cholestyramine (Questran) Griseofulvin Oral Contraceptives Rifampin Sucralfate (Carafate) Vitamin K   Coumadin (Warfarin) Major Herbal Interactions  Increased Warfarin Effect Decreased Warfarin Effect  Garlic Ginseng Ginkgo  biloba Coenzyme Q10 Green tea St. Johns wort    Coumadin (Warfarin) FOOD Interactions  Eat a consistent number of servings per week of foods HIGH in Vitamin K (1 serving =  cup)  Collards (cooked, or boiled & drained) Kale (cooked, or boiled & drained) Mustard greens (cooked, or boiled & drained) Parsley *serving size only =  cup Spinach (cooked, or boiled & drained) Swiss chard (cooked, or boiled & drained) Turnip greens (cooked, or boiled & drained)  Eat a consistent number of servings per week of foods MEDIUM-HIGH in Vitamin K (1 serving = 1 cup)  Asparagus (cooked, or boiled & drained) Broccoli (cooked, boiled & drained, or raw & chopped) Brussel sprouts (cooked, or boiled & drained) *serving size only =  cup Lettuce, raw (green leaf, endive, romaine) Spinach, raw Turnip greens, raw & chopped   These websites have more information on Coumadin (warfarin):  http://www.king-russell.com/; https://www.hines.net/;   Heart Failure Heart failure means your heart has trouble pumping blood. This makes it hard for your body to work well. Heart failure is usually a long-term (chronic) condition. You must take good care of yourself and follow your doctor's treatment plan. HOME CARE  Take your heart medicine as told by your doctor.  Do not stop taking medicine unless your doctor tells you to.  Do not skip any dose of medicine.  Refill your medicines before they run out.  Take other medicines only as told by your doctor or pharmacist.  Stay active if told by your doctor. The elderly and people with severe heart failure should talk with a doctor about physical activity.  Eat heart healthy foods. Choose foods that are without trans fat and are low in saturated fat, cholesterol, and salt (sodium). This includes fresh or frozen fruits and vegetables, fish, lean meats, fat-free or low-fat dairy foods, whole grains, and high-fiber foods. Lentils and dried peas and beans (legumes) are  also good choices.  Limit salt if told by your doctor.  Cook in a healthy way. Roast, grill, broil, bake, poach, steam, or stir-fry foods.  Limit fluids as told by your doctor.  Weigh yourself every morning. Do this after you pee (urinate) and before you eat breakfast. Write down your weight to give to your doctor.  Take your blood pressure and write it down if your doctor tell you to.  Ask your doctor how to check your pulse. Check your pulse as told.  Lose weight if told by your doctor.  Stop smoking or chewing tobacco. Do not use gum or patches that help you quit without your doctor's approval.  Schedule and go to doctor visits as told.  Nonpregnant women should have no more than 1 drink a day. Men should have no more than 2 drinks a day. Talk to your doctor about drinking alcohol.  Stop illegal drug use.  Stay current with shots (immunizations).  Manage your health conditions as told by your doctor.  Learn to manage your stress.  Rest when you are tired.  If it is really hot outside:  Avoid intense activities.  Use air conditioning or fans, or get in a cooler place.  Avoid caffeine and alcohol.  Wear loose-fitting,  lightweight, and light-colored clothing.  If it is really cold outside:  Avoid intense activities.  Layer your clothing.  Wear mittens or gloves, a hat, and a scarf when going outside.  Avoid alcohol.  Learn about heart failure and get support as needed.  Get help to maintain or improve your quality of life and your ability to care for yourself as needed. GET HELP IF:   You gain 03 lb/1.4 kg or more in 1 day or 05 lb/2.3 kg in a week.  You are more short of breath than usual.  You cannot do your normal activities.  You tire easily.  You cough more than normal, especially with activity.  You have any or more puffiness (swelling) in areas such as your hands, feet, ankles, or belly (abdomen).  You cannot sleep because it is hard to  breathe.  You feel like your heart is beating fast (palpitations).  You get dizzy or lightheaded when you stand up. GET HELP RIGHT AWAY IF:   You have trouble breathing.  There is a change in mental status, such as becoming less alert or not being able to focus.  You have chest pain or discomfort.  You faint. MAKE SURE YOU:   Understand these instructions.  Will watch your condition.  Will get help right away if you are not doing well or get worse. Document Released: 12/14/2007 Document Revised: 07/01/2012 Document Reviewed: 10/05/2011 [facility] Patient Information 2014 [place], [place].

## 2013-06-09 NOTE — Progress Notes (Signed)
Pt did well off of Cpap during the night.  He was able to maintain sats and had no increasing runs of VT.    He had long run of VT at 0552 this am.  He also had 4 episodes during the night with 3 beat non substained VT. Pt was asymptomatic each time.   Will continue to monitor. Karena Addison T

## 2013-06-10 ENCOUNTER — Ambulatory Visit: Payer: BC Managed Care – PPO | Admitting: Family Medicine

## 2013-06-12 ENCOUNTER — Ambulatory Visit (INDEPENDENT_AMBULATORY_CARE_PROVIDER_SITE_OTHER): Payer: BC Managed Care – PPO | Admitting: Pulmonary Disease

## 2013-06-12 ENCOUNTER — Encounter: Payer: Self-pay | Admitting: Pulmonary Disease

## 2013-06-12 VITALS — BP 108/62 | HR 98 | Temp 96.7°F | Ht 72.0 in | Wt 198.4 lb

## 2013-06-12 DIAGNOSIS — G4730 Sleep apnea, unspecified: Secondary | ICD-10-CM

## 2013-06-12 NOTE — Progress Notes (Signed)
Subjective:    Patient ID: [name], male    DOB: March 05, 1971, 43 y.o.   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   Admitted in feb 2015with several weeks of dyspnea, weight gain and fluid retention. He was found to have severe nonischemic cardio myopathy.Echo LVEF 10% with global HK, large LV thrombus and severe RV dysfunction . He was treated with milrinone and heart failure medications, placed On lifevest and has followup at the heart failure clinic  Used cpap in hospital except last 2 nights  He diuresed 40 pounds in the hospital, and wife reports a dramatic decrease in his snoring and witnessed apneas. He has had symptoms of somnolence and snoring for about 7 years. Epworth sleepiness score is now 6/24 Bedtime is around 11 PM, he sleeps on his back with 2 pillows and keeps his life vest, on reports 2 nocturnal awakenings without any post void sleep latency and is out of bed by 7 AM feeling tired with occasional dryness of mouth. He went into the hospital at 222 pounds and was discharged at 198 pounds. There is no history suggestive of cataplexy, sleep paralysis or parasomnias  Past Medical History  Diagnosis Date  . Hypertension   . Athlete's foot     No past surgical history on file.  No Known Allergies History   Social History  . Marital Status: Married    Spouse Name: N/A    Number of Children: 0  . Years of Education: N/A   Occupational History  . Telephone    Social History Main Topics  . Smoking status: Never Smoker   . Smokeless tobacco: Not on file  . Alcohol Use: Yes     Comment: rare  . Drug Use: Not on file  . Sexual Activity: Not on file   Other Topics Concern  . Not on file   Social History Narrative   Lives with wife [name].   Occupation Recruitment consultant    Family History  Problem Relation Age of Onset  . Hypertension Mother   . Diabetes Mother   .  Hypertension Maternal Grandmother   . Diabetes Maternal Grandmother   . Heart disease Maternal Grandmother         Review of Systems  Constitutional: Negative for fever and unexpected weight change.  HENT: Positive for congestion, rhinorrhea and sneezing. Negative for dental problem, ear pain, nosebleeds, postnasal drip, sinus pressure, sore throat and trouble swallowing.   Eyes: Negative for redness and itching.  Respiratory: Positive for cough. Negative for chest tightness, shortness of breath and wheezing.   Cardiovascular: Negative for palpitations and leg swelling.  Gastrointestinal: Negative for nausea and vomiting.  Genitourinary: Positive for testicular pain. Negative for dysuria.  Musculoskeletal: Negative for joint swelling.  Skin: Negative for rash.  Neurological: Negative for headaches.  Hematological: Does not bruise/bleed easily.  Psychiatric/Behavioral: Negative for dysphoric mood. The patient is not nervous/anxious.        Objective:   Physical Exam  Gen. Pleasant, well-nourished, in no distress, normal affect, long beard ENT - no lesions, no post nasal drip Neck: No JVD, no thyromegaly, no carotid bruits Lungs: no use of accessory muscles, no dullness to percussion, clear without rales or rhonchi  Cardiovascular: Rhythm regular, heart sounds  normal, no murmurs or gallops, no peripheral edema Abdomen: soft and non-tender, no hepatosplenomegaly, BS normal. Musculoskeletal: No deformities, no cyanosis or clubbing Neuro:  alert, non  focal       Assessment & Plan:

## 2013-06-12 NOTE — Progress Notes (Signed)
   Subjective:    Patient ID: [name], male    DOB: [birth year], 43 y.o.   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 Admitted with several week h/o HF symptoms.  Used cpap in hospital except last 2 nights Denies strong FHx of CHF. No viral prodrome.  Echo LVEF 10% with global HK, large LV thrombus and severe RV dysfunction On lifevest    Review of Systems     Objective:   Physical Exam        Assessment & Plan:

## 2013-06-12 NOTE — Patient Instructions (Signed)
We discussed effects of sleep apnea on your heart Schedule sleep study

## 2013-06-12 NOTE — Assessment & Plan Note (Addendum)
Given excessive daytime somnolence, narrow pharyngeal exam, witnessed apneas & loud snoring, obstructive sleep apnea is very likely & an overnight polysomnogram will be scheduled as a split study. The pathophysiology of obstructive sleep apnea , it's cardiovascular consequences & modes of treatment including CPAP were discused with the patient in detail & they evidenced understanding. Due to unexplained cardiomyopathy, he certainly could also central apneas. Attended polysomnogram will be preferred.

## 2013-06-13 ENCOUNTER — Ambulatory Visit (INDEPENDENT_AMBULATORY_CARE_PROVIDER_SITE_OTHER): Payer: BC Managed Care – PPO | Admitting: *Deleted

## 2013-06-13 DIAGNOSIS — I749 Embolism and thrombosis of unspecified artery: Secondary | ICD-10-CM

## 2013-06-13 DIAGNOSIS — Z7901 Long term (current) use of anticoagulants: Secondary | ICD-10-CM

## 2013-06-13 LAB — POCT INR: INR: 2.7

## 2013-06-13 NOTE — Patient Instructions (Signed)

## 2013-06-16 ENCOUNTER — Ambulatory Visit (HOSPITAL_COMMUNITY)
Admission: RE | Admit: 2013-06-16 | Discharge: 2013-06-16 | Disposition: A | Discharge status: Home / Self Care | Payer: BC Managed Care – PPO | Source: Ambulatory Visit | Attending: Internal Medicine | Admitting: Internal Medicine

## 2013-06-16 ENCOUNTER — Encounter (HOSPITAL_COMMUNITY): Payer: Self-pay

## 2013-06-16 ENCOUNTER — Telehealth (HOSPITAL_COMMUNITY): Payer: Self-pay

## 2013-06-16 VITALS — BP 118/76 | HR 87 | Wt 197.0 lb

## 2013-06-16 DIAGNOSIS — I498 Other specified cardiac arrhythmias: Secondary | ICD-10-CM | POA: Insufficient documentation

## 2013-06-16 DIAGNOSIS — I5022 Chronic systolic (congestive) heart failure: Secondary | ICD-10-CM | POA: Insufficient documentation

## 2013-06-16 DIAGNOSIS — G4730 Sleep apnea, unspecified: Secondary | ICD-10-CM

## 2013-06-16 DIAGNOSIS — I472 Ventricular tachycardia: Secondary | ICD-10-CM

## 2013-06-16 DIAGNOSIS — I4729 Other ventricular tachycardia: Secondary | ICD-10-CM

## 2013-06-16 LAB — BASIC METABOLIC PANEL
BUN: 16 mg/dL (ref 6–23)
CALCIUM: 9 mg/dL (ref 8.4–10.5)
CO2: 24 mEq/L (ref 19–32)
Chloride: 99 mEq/L (ref 96–112)
Creatinine, Ser: 0.8 mg/dL (ref 0.50–1.35)
GFR calc Af Amer: >90 mL/min (ref >90)
GFR calc non Af Amer: >90 mL/min (ref >90)
GLUCOSE: 125 mg/dL — AB (ref 70–99)
Potassium: 4.4 mEq/L (ref 3.7–5.3)
Sodium: 136 mEq/L — ABNORMAL LOW (ref 137–147)

## 2013-06-16 LAB — DIGOXIN LEVEL: Digoxin Level: 1.1 ng/mL (ref 0.8–2.0)

## 2013-06-16 MED ORDER — FUROSEMIDE 20 MG PO TABS: 20.0000 mg | ORAL_TABLET | ORAL | Status: DC | PRN

## 2013-06-16 MED ORDER — DIGOXIN 125 MCG PO TABS: 0.1250 mg | ORAL_TABLET | Freq: Every day | ORAL | Status: DC

## 2013-06-16 MED ORDER — CARVEDILOL 6.25 MG PO TABS: 6.2500 mg | ORAL_TABLET | Freq: Two times a day (BID) | ORAL | Status: DC

## 2013-06-16 NOTE — Telephone Encounter (Signed)
Patient called to inform of lab results.  Instructed to decrease digoxin to 0.125mg  once daily.  Corrected Rx sent to preferred pharmacy electronically. [address] Filter

## 2013-06-16 NOTE — Progress Notes (Signed)
Patient ID: [name], male   DOB: 08-Mar-1971, 43 y.o.   MRN: [id number]  Weight Range   Baseline proBNP   PCP: None Pulmonary: Dr Vassie Loll  HPI: [name] is a 43 year old with a history of ETOH consumption, obesity, and newly diagnosed acute systolic heart failure 05/31/13. Admitted to [facility] 05/31/13 with dyspnea and lowe extremity edema which was from acute systolic heart failure. Echo LVEF 10% with global HK, large LV thrombus and severe RV dysfunction.  LHC 05/2013 normal cors. Required short course of Milrinone. He was discharged on carvedilol 3.125 mg twice a day,  lisinopril 5 mg twice a day, digoxin 0.25 mg dialy,  Coumadin, and 12.5 mg spironolactone. He was discharged with lifevest.  Discharge weight was 191 pounds.   He returns for follow up with his wife. Overall he says he feels better every day. Denies SOB/PND/Orthopnea. Denies dizziness. Weight at 188-189 pounds. Wear lifevest. No alarms on lifevest. Following low salt diet and limiting fluid intake < 2 liters per day. Compliant with medications. No alcohol.  He has scheduled sleep study April 30th .   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 Married lives with his wife,  FH: Mom   HTN, DM       Grandmother  HTN, DM, heart disease    ROS: All systems negative except as listed in HPI, PMH and Problem List.  Past Medical History  Diagnosis Date  . Hypertension   . Athlete's foot     Current Outpatient Prescriptions  Medication Sig Dispense Refill  . carvedilol (COREG) 3.125 MG tablet Take 1 tablet (3.125 mg total) by mouth 2 (two) times daily with a meal.  60 tablet  6  . citalopram (CELEXA) 10 MG tablet Take 1 tablet (10 mg total) by mouth daily.  30 tablet  6  . digoxin (LANOXIN) 0.25 MG tablet Take 1 tablet (0.25 mg total) by mouth daily.  30 tablet  6  . lisinopril (PRINIVIL,ZESTRIL) 5 MG tablet Take 1 tablet (5  mg total) by mouth 2 (two) times daily.  60 tablet  6  . spironolactone (ALDACTONE) 25 MG tablet Take 0.5 tablets (12.5 mg total) by mouth daily.  15 tablet  6  . warfarin (COUMADIN) 4 MG tablet Take 1 tablet (4 mg total) by mouth daily.  45 tablet  6  . ALPRAZolam (XANAX) 0.5 MG tablet Take 0.5 mg by mouth at bedtime as needed for sleep.      Marland Kitchen dextromethorphan (DELSYM) 30 MG/5ML liquid Take 30 mg by mouth 2 (two) times daily as needed for cough.      Marland Kitchen omeprazole (PRILOSEC) 20 MG capsule Take 20 mg by mouth daily.        No current facility-administered medications for this encounter.     PHYSICAL EXAM: Filed Vitals:   06/16/13 0935  BP: 118/76  Pulse: 87  Weight: 197 lb (89.359 kg)  SpO2: 99%    General:  Well appearing. No resp difficulty Wife present  HEENT: normal Neck: supple. JVP 5-6. Carotids 2+ bilaterally; no bruits. No lymphadenopathy or thryomegaly appreciated. Cor: PMI normal. Regular rate & rhythm. No rubs, gallops or murmurs. No S3 Lungs: clear Abdomen: soft, nontender, nondistended. No hepatosplenomegaly. No bruits or masses. Good bowel sounds. Extremities: no cyanosis, clubbing, rash, edema Neuro: alert & orientedx3, cranial nerves grossly intact. Moves all 4  extremities w/o difficulty. Affect pleasant.      ASSESSMENT & PLAN: 1. Chronic Systolic Heart Failure- ECHO EF 10%  He returns for post hospital follow up. Overall he is dong great. NYHA II.  Volume status stable. Continue spironolactone 12.5 mg daily. I have added prn lasix for 3-5 pound weight gain in 24 hours.  Increase carvedilol 6.25 mg twice a day and continue digoxin 0.25 mg daily.   Continue lisinopril 5mg  twice a day Reinforced daily weights, low salt food choices, limiting fluid intake to < 2 liters per day.  Check BMET and dig level today. K 4.4 Creatinine 0.8 Dig level 1.1 will cut back dig to 0.125 mg daily. Plan to repeat ECHO in 3 months after HF meds optimized.   2. ETOH-  congratulated on alcohol cessation.  3. NSVT- continue lifevest until repeat ECHO in June after HF meds optimized.  4. 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 [facility] 5. Blood Type- O positive 6. . Snores- Sleeping study planned for April 30th per Dr Vassie LollAlva  Follow up in 2 weeks with Dr Gala RomneyBensimhon.    [name].NP-C   10:17 AM

## 2013-06-16 NOTE — Patient Instructions (Signed)
Follow up in 2 weeks with Dr Gala Romney  Take carvedilol 6.25 mg twice a day  Do the following things EVERYDAY: 1) Weigh yourself in the morning before breakfast. Write it down and keep it in a log. 2) Take your medicines as prescribed 3) Eat low salt foods-Limit salt (sodium) to 2000 mg per day.  4) Stay as active as you can everyday 5) Limit all fluids for the day to less than 2 liters

## 2013-06-17 ENCOUNTER — Ambulatory Visit (INDEPENDENT_AMBULATORY_CARE_PROVIDER_SITE_OTHER): Payer: BC Managed Care – PPO | Admitting: Pharmacist

## 2013-06-17 DIAGNOSIS — I749 Embolism and thrombosis of unspecified artery: Secondary | ICD-10-CM

## 2013-06-17 DIAGNOSIS — Z7901 Long term (current) use of anticoagulants: Secondary | ICD-10-CM

## 2013-06-17 LAB — POCT INR: INR: 2.4

## 2013-06-17 NOTE — Discharge Summary (Signed)
Patient ID: [name] [place] MRN: [id number] DOB/AGE: [birth year] 41 y.o.  Admit date: 05/31/2013 Discharge date: 06/09/13  Primary Discharge Diagnosis 1. Acute systolic HF due to NICM EF 10-15%     -- normal coronaries on cath 2. Cardiogenic shock 3. LV thrombus 4. Situational depression 5. 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 [facility] with several week h/o HF symptoms. Denied strong FHx of CHF. No viral prodrome.   Echo showed  LVEF 10% with global HK, large LV thrombus and severe RV dysfunction. He was started on IV lasix. On the morning after admission, he developed worsening HF symptoms with evidence of shock liver (transaminases > 2,000) and shock kidney. The advaaced HF team was consulted and assumed his care. A PA catheter was placed which confirmed a low output state (initial co-ox 39%) with marked volume overload. Milrinone and IV lasix were started. He responded well to therapy. He was also started on heparin and coumadin for the presence of an LV thrombus.Heparin was stopped after 24 hours of a therapeutic INR.    On 3/18 underwent coronary angio with normal coronaries. He diuresed over 40 pounds. His milrinone was weaned slowly (using daily co-oxs as a guide) due to soft blood pressures and lisinopril was titrated gently. While on milrinone he also had several 20+ beat runs on NSVT and a LIfeVest was place prior to discharge. Prior to d/c we were able to initiate low-dose carvedilol. His volume status was stable on spironolactone and he was not d/c'd on lasix. He was also started on Celexa due to situational depression which improved markedly.   The etiology of his CM was felt to be ETOH vs viral. He was counseled to abstain from ETOH consumption. Hepatitis, iron and HIV serologies were negative. His blood type was O  pos. D/c weight was 192 pounds.     Discharge Info: Blood pressure 95/70, pulse 112, temperature 97.5 F (36.4 C), temperature source Oral, resp. rate 20, height 6' (1.829 m), weight 191 lb 5.8 oz (86.8 kg), SpO2 99.00%.    Weight change:  No results found for this or any previous visit (from the past 24 hour(s)).   Discharge Medications:   Medication List    STOP taking these medications       ondansetron 4 MG tablet  Commonly known as:  ZOFRAN     ranitidine 150 MG tablet  Commonly known as:  ZANTAC      TAKE these medications       ALPRAZolam 0.5 MG tablet  Commonly known as:  XANAX  Take 0.5 mg by mouth at bedtime as needed for sleep.     citalopram 10 MG tablet  Commonly known as:  CELEXA  Take 1 tablet (10 mg total) by mouth daily.     dextromethorphan 30 MG/5ML liquid  Commonly known as:  DELSYM  Take 30 mg by mouth 2 (two) times daily as needed for cough.     lisinopril 5 MG tablet  Commonly known as:  PRINIVIL,ZESTRIL  Take 1 tablet (5 mg total) by mouth 2 (two) times daily.     omeprazole 20 MG capsule  Commonly known as:  PRILOSEC  Take 20 mg by mouth daily.     spironolactone 25 MG tablet  Commonly known as:  ALDACTONE  Take 0.5 tablets (12.5 mg total) by mouth  daily.     warfarin 4 MG tablet  Commonly known as:  COUMADIN  Take 1 tablet (4 mg total) by mouth daily.      Carvedilol 3.125 mg bid  Follow-up Plans & Instructions:     Discharge Orders   Future Appointments Provider Department Dept Phone   06/23/2013 3:15 PM [facility] [place] Office [phone]   06/30/2013 10:20 AM [facility] [phone]   07/17/2013 8:00 PM [facility] [phone]   Future Orders Complete By Expires   ACE Inhibitor / ARB already ordered  As directed    Diet - low sodium heart healthy  As directed    Heart Failure patients  record your daily weight using the same scale at the same time of day  As directed    Increase activity slowly  As directed      Follow-up Information   Follow up with CLEGG,AMY, NP On 06/16/2013. (at 9:30 Garage Code 3000)    Specialty:  Nurse Practitioner   Contact information:   [address] [place] [address] [phone]       Follow up with [facility] On 06/13/2013. (at 10:40 )    Contact information:   [address] [place] [address]        BRING ALL MEDICATIONS WITH YOU TO FOLLOW UP APPOINTMENTS  Time spent with patient to include physician time: 35 mins Signed:  Arvilla Meres, MD 06/17/2013, 10:47 PM

## 2013-06-19 ENCOUNTER — Telehealth: Payer: Self-pay | Admitting: Family Medicine

## 2013-06-19 NOTE — Telephone Encounter (Signed)
Pt called about bill.  I will contact charge correction.  Awaiting confirmation from Dr. Milinda Antis.

## 2013-06-23 ENCOUNTER — Ambulatory Visit (INDEPENDENT_AMBULATORY_CARE_PROVIDER_SITE_OTHER): Payer: BC Managed Care – PPO

## 2013-06-23 DIAGNOSIS — Z7901 Long term (current) use of anticoagulants: Secondary | ICD-10-CM

## 2013-06-23 DIAGNOSIS — I749 Embolism and thrombosis of unspecified artery: Secondary | ICD-10-CM

## 2013-06-23 LAB — POCT INR: INR: 2.5

## 2013-06-25 ENCOUNTER — Encounter (HOSPITAL_COMMUNITY): Payer: Self-pay | Admitting: Internal Medicine

## 2013-06-30 ENCOUNTER — Telehealth: Payer: Self-pay | Admitting: Family Medicine

## 2013-06-30 ENCOUNTER — Ambulatory Visit (HOSPITAL_COMMUNITY)
Admission: RE | Admit: 2013-06-30 | Discharge: 2013-06-30 | Disposition: A | Discharge status: Home / Self Care | Payer: BC Managed Care – PPO | Source: Ambulatory Visit | Attending: Internal Medicine | Admitting: Internal Medicine

## 2013-06-30 VITALS — BP 94/60 | HR 92 | Wt 200.5 lb

## 2013-06-30 DIAGNOSIS — I5022 Chronic systolic (congestive) heart failure: Secondary | ICD-10-CM | POA: Insufficient documentation

## 2013-06-30 MED ORDER — CARVEDILOL 6.25 MG PO TABS: ORAL_TABLET | ORAL | Status: DC

## 2013-06-30 MED ORDER — SPIRONOLACTONE 25 MG PO TABS: 25.0000 mg | ORAL_TABLET | Freq: Every day | ORAL | Status: DC

## 2013-06-30 NOTE — Telephone Encounter (Signed)
Ok w/ me 

## 2013-06-30 NOTE — Telephone Encounter (Signed)
That is fine with me -nice patient

## 2013-06-30 NOTE — Patient Instructions (Signed)
Increase Spironolaconte to 25 mg (1 tab) daily  Increase Carvedilol to 1 tab in AM and 1 & 1/2 tabs in PM  Labs in 1 week  Your physician recommends that you schedule a follow-up appointment in: 3 weeks

## 2013-06-30 NOTE — Progress Notes (Signed)
Patient ID: [name], male   DOB: 1970-07-29, 43 y.o.   MRN: [id number]  Weight Range   Baseline proBNP   PCP: None Pulmonary: Dr Vassie Loll  HPI: [name] is a 43 year old with a history of ETOH consumption, obesity, and newly diagnosed acute systolic heart failure 05/31/13. Admitted to [facility] 05/31/13 with dyspnea and lowe extremity edema which was from acute systolic heart failure. Echo LVEF 10% with global HK, large LV thrombus and severe RV dysfunction.  LHC 05/2013 normal cors. Required short course of Milrinone. He was discharged on carvedilol 3.125 mg twice a day,  lisinopril 5 mg twice a day, digoxin 0.25 mg dialy,  Coumadin, and 12.5 mg spironolactone. He was discharged with lifevest.  Discharge weight was 191 pounds.   He returns for follow up with his wife. Overall he says he feels better every day. Walking 20 mis a day. Only gets winded if he walks fast or does too much. Denies PND/Orthopnea/edema. Denies dizziness. Was 188 at d/c. Now 192-193. Wear lifevest. No alarms on lifevest. Following low salt diet and limiting fluid intake < 2 liters per day. Compliant with medications. No alcohol.  No bleeding with coumadin. He has scheduled sleep study April 30th .   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  Past Medical History  Diagnosis Date  . Hypertension   . Athlete's foot     Current Outpatient Prescriptions  Medication Sig Dispense Refill  . carvedilol (COREG) 6.25 MG tablet Take 1 tablet (6.25 mg total) by mouth 2 (two) times daily with a meal.  60 tablet  6  . citalopram (CELEXA) 10 MG tablet Take 1 tablet (10 mg total) by mouth daily.  30 tablet  6  .  dextromethorphan (DELSYM) 30 MG/5ML liquid Take 30 mg by mouth 2 (two) times daily as needed for cough.      . digoxin (LANOXIN) 0.125 MG tablet Take 1 tablet (0.125 mg total) by mouth daily.  30 tablet  6  . furosemide (LASIX) 20 MG tablet Take 1 tablet (20 mg total) by mouth as needed.  30 tablet  6  . lisinopril (PRINIVIL,ZESTRIL) 5 MG tablet Take 1 tablet (5 mg total) by mouth 2 (two) times daily.  60 tablet  6  . Multiple Vitamin (MULTIVITAMIN) tablet Take 1 tablet by mouth daily.      Marland Kitchen spironolactone (ALDACTONE) 25 MG tablet Take 0.5 tablets (12.5 mg total) by mouth daily.  15 tablet  6  . warfarin (COUMADIN) 4 MG tablet Take 1 tablet (4 mg total) by mouth daily.  45 tablet  6   No current facility-administered medications for this encounter.     PHYSICAL EXAM: Filed Vitals:   06/30/13 1032  BP: 94/60  Pulse: 92  Weight: 200 lb 8 oz (90.946 kg)  SpO2: 98%    General:  Well appearing. No resp difficulty Wife present  HEENT: normal Neck: supple. JVP 6. Carotids 2+ bilaterally; no bruits. No lymphadenopathy or thryomegaly appreciated. Cor: PMI normal. Regular rate & rhythm. No rubs, gallops or murmurs. No S3 Lungs:  clear Abdomen: soft, nontender, nondistended. No hepatosplenomegaly. No bruits or masses. Good bowel sounds. Extremities: no cyanosis, clubbing, rash, edema Neuro: alert & orientedx3, cranial nerves grossly intact. Moves all 4 extremities w/o difficulty. Affect pleasant.   ASSESSMENT & PLAN: 1. Chronic Systolic Heart Failure- ECHO EF 10%  Continues to do well. NYHA II. EF 20-25% on bedside echo today Volume status trending up a little. Will increase spiro to 25. Can take lasix if weight hits 195  Increase carvedilol to 6.25/9.375   Continue lisinopril 5mg  twice a day and dig 0.125 Reinforced daily weights, low salt food choices, limiting fluid intake to < 2 liters per day.  Check BMET today. K 4.4  Plan to repeat ECHO in 3 months after HF meds optimized.   2.  ETOH- congratulated on alcohol cessation.  3. NSVT- continue lifevest until repeat ECHO in June after HF meds optimized. If EF remains <= 35% will need EP referral for ICD.  4. 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 [facility] 5. Blood Type- O positive 6. Snores- Sleeping study planned for April 30th per Dr Vassie LollAlva  Follow up in 3 weeks for further med titration.    Dolores Pattyaniel R Bensimhon MD  10:43 AM

## 2013-06-30 NOTE — Telephone Encounter (Signed)
Patient would like to switch from Dr. Milinda Antis to Dr. Beverely Low. Is this okay?

## 2013-07-03 ENCOUNTER — Ambulatory Visit (INDEPENDENT_AMBULATORY_CARE_PROVIDER_SITE_OTHER): Payer: BC Managed Care – PPO | Admitting: *Deleted

## 2013-07-03 DIAGNOSIS — I749 Embolism and thrombosis of unspecified artery: Secondary | ICD-10-CM

## 2013-07-03 DIAGNOSIS — Z7901 Long term (current) use of anticoagulants: Secondary | ICD-10-CM

## 2013-07-03 LAB — POCT INR: INR: 2.2

## 2013-07-07 ENCOUNTER — Other Ambulatory Visit (INDEPENDENT_AMBULATORY_CARE_PROVIDER_SITE_OTHER): Payer: BC Managed Care – PPO

## 2013-07-07 DIAGNOSIS — I5022 Chronic systolic (congestive) heart failure: Secondary | ICD-10-CM

## 2013-07-07 LAB — BASIC METABOLIC PANEL
BUN: 14 mg/dL (ref 6–23)
CHLORIDE: 104 meq/L (ref 96–112)
CO2: 27 mEq/L (ref 19–32)
Calcium: 9.2 mg/dL (ref 8.4–10.5)
Creatinine, Ser: 0.9 mg/dL (ref 0.4–1.5)
GFR: 104.13 mL/min (ref >60.00)
Glucose, Bld: 89 mg/dL (ref 70–99)
POTASSIUM: 4.2 meq/L (ref 3.5–5.1)
Sodium: 139 mEq/L (ref 135–145)

## 2013-07-16 ENCOUNTER — Ambulatory Visit (INDEPENDENT_AMBULATORY_CARE_PROVIDER_SITE_OTHER): Payer: BC Managed Care – PPO | Admitting: *Deleted

## 2013-07-16 DIAGNOSIS — Z7901 Long term (current) use of anticoagulants: Secondary | ICD-10-CM

## 2013-07-16 DIAGNOSIS — I749 Embolism and thrombosis of unspecified artery: Secondary | ICD-10-CM

## 2013-07-16 LAB — POCT INR: INR: 1.8

## 2013-07-17 ENCOUNTER — Ambulatory Visit (HOSPITAL_BASED_OUTPATIENT_CLINIC_OR_DEPARTMENT_OTHER): Payer: BC Managed Care – PPO | Attending: Pulmonary Disease

## 2013-07-17 VITALS — Ht 72.0 in | Wt 195.0 lb

## 2013-07-17 DIAGNOSIS — G2581 Restless legs syndrome: Secondary | ICD-10-CM | POA: Insufficient documentation

## 2013-07-17 DIAGNOSIS — I428 Other cardiomyopathies: Secondary | ICD-10-CM | POA: Insufficient documentation

## 2013-07-17 DIAGNOSIS — G4730 Sleep apnea, unspecified: Secondary | ICD-10-CM

## 2013-07-17 DIAGNOSIS — G4733 Obstructive sleep apnea (adult) (pediatric): Secondary | ICD-10-CM

## 2013-07-21 NOTE — Progress Notes (Signed)
Patient ID: Chrisandra NettersJonathan Heiden, male   DOB: 1970-11-24, 43 y.o.   MRN: [id number]  PCP: None Pulmonary: Dr Vassie LollAlva  HPI: [name] is a 43 year old with a history of ETOH consumption, obesity, and newly diagnosed acute systolic heart failure 05/31/13. Ad[facility] 05/31/13 with dyspnea and lower extremity edema which was from acute systolic heart failure. Echo LVEF 10% with global HK, large LV thrombus and severe RV dysfunction.  LHC 05/2013 normal cors. Required short course of Milrinone. He was discharged on carvedilol 3.125 mg twice a day,  lisinopril 5 mg twice a day, digoxin 0.25 mg dialy,  Coumadin, and 12.5 mg spironolactone. He was discharged with lifevest.  Discharge weight was 191 pounds.   Follow up: Last visit increased spiro to 25 mg daily and coreg to 6.25 mg q am and 9.375 mg q pm, which he tolerated. Denies SOB, orthopnea, PND, or CP. DOE after walking 20 minutes which he walks about 4x a week. Able to walk up the steps. Wearing LifeVest and has had a few alarms he has had to dismiss. Compliant with medicaitons. Denies any bleeding issues. Following a low diet and trying to drink less than 2L a day. Weight at home 195-197 lbs and has taken lasix about 6 times. Had sleep study April 30th.   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  Past Medical History  Diagnosis Date  . Hypertension   . Athlete's foot     Current Outpatient Prescriptions  Medication Sig Dispense Refill  . carvedilol (COREG) 6.25 MG tablet Take 1 tab in AM and 1 & 1/2 tabs in PM  60 tablet  6  . citalopram (CELEXA) 10 MG tablet Take 1 tablet (10 mg  total) by mouth daily.  30 tablet  6  . digoxin (LANOXIN) 0.125 MG tablet Take 1 tablet (0.125 mg total) by mouth daily.  30 tablet  6  . furosemide (LASIX) 20 MG tablet Take 20 mg by mouth as needed (For weight greater than 195lb).      Marland Kitchen. lisinopril (PRINIVIL,ZESTRIL) 5 MG tablet Take 1 tablet (5 mg total) by mouth 2 (two) times daily.  60 tablet  6  . Multiple Vitamin (MULTIVITAMIN) tablet Take 1 tablet by mouth daily.      Marland Kitchen. spironolactone (ALDACTONE) 25 MG tablet Take 1 tablet (25 mg total) by mouth daily.  30 tablet  3  . warfarin (COUMADIN) 4 MG tablet Take 1 tablet (4 mg total) by mouth daily.  45 tablet  6   No current facility-administered medications for this encounter.    Filed Vitals:   07/22/13 0938  BP: 104/62  Pulse: 84  Resp: 18  Weight: 204 lb 4 oz (92.647 kg)  SpO2: 98%    PHYSICAL EXAM: General:  Well appearing. No resp difficulty Wife present  HEENT: normal Neck: supple. JVP 6. Carotids 2+ bilaterally; no bruits. No lymphadenopathy or thryomegaly appreciated. Cor: PMI normal. Regular rate & rhythm. No rubs, gallops or murmurs. No S3 Lungs: clear Abdomen: soft, nontender, nondistended. No hepatosplenomegaly. No  bruits or masses. Good bowel sounds. Extremities: no cyanosis, clubbing, rash, edema Neuro: alert & orientedx3, cranial nerves grossly intact. Moves all 4 extremities w/o difficulty. Affect pleasant.   ASSESSMENT & PLAN:  1. Chronic Systolic Heart Failure: NICM, EF 20-25% (bedisde ECHO 06/2013) - Doing great. NYHA II symptoms and volume status stable. Will continue PRN lasix and increase dry weight to 200 lbs. If weight > 200 lbs or any SOB take lasix. - Will increase coreg to 9.375 mg BID, call any dizziness. - Continue spiro, lisinopril and digoxin at current doses. - Recheck digoxin level at next INR visit.  - Continue LifeVest and will repeat ECHO in June 2015. If EF remains less than 35% will refer to EP for ICD. QRS narrow so no CRT-D. -  Reinforced the need and importance of daily weights, a low sodium diet, and fluid restriction (less than 2 L a day). Instructed to call the HF clinic if weight increases more than 3 lbs overnight or 5 lbs in a week. 2. ETOH- Continues to abstain and congratulated on quitting. He was not a heavy drinker before HF, however could have contributed. 3. NSVT- continue lifevest until repeat ECHO in June after HF meds optimized. He has had a few alarms that he has pressed the button on LifeVest to prevent shocks. Denies any dizziness. 4. 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 [facility]. Will reassess with next ECHO 5. OSA? Wife reports he snores. He had sleep study last week and waiting results.    Follow up in 3 weeks for further med titration.   Aundria Rud NP-C 9:56 AM

## 2013-07-22 ENCOUNTER — Other Ambulatory Visit (HOSPITAL_COMMUNITY): Payer: Self-pay

## 2013-07-22 ENCOUNTER — Ambulatory Visit (HOSPITAL_COMMUNITY)
Admission: RE | Admit: 2013-07-22 | Discharge: 2013-07-22 | Disposition: A | Discharge status: Home / Self Care | Payer: BC Managed Care – PPO | Source: Ambulatory Visit | Attending: Internal Medicine | Admitting: Internal Medicine

## 2013-07-22 ENCOUNTER — Encounter (HOSPITAL_COMMUNITY): Payer: Self-pay

## 2013-07-22 VITALS — BP 104/62 | HR 84 | Resp 18 | Wt 204.2 lb

## 2013-07-22 DIAGNOSIS — I472 Ventricular tachycardia: Secondary | ICD-10-CM

## 2013-07-22 DIAGNOSIS — Z7901 Long term (current) use of anticoagulants: Secondary | ICD-10-CM | POA: Insufficient documentation

## 2013-07-22 DIAGNOSIS — I5022 Chronic systolic (congestive) heart failure: Secondary | ICD-10-CM | POA: Insufficient documentation

## 2013-07-22 DIAGNOSIS — I509 Heart failure, unspecified: Secondary | ICD-10-CM

## 2013-07-22 DIAGNOSIS — G4730 Sleep apnea, unspecified: Secondary | ICD-10-CM

## 2013-07-22 DIAGNOSIS — I10 Essential (primary) hypertension: Secondary | ICD-10-CM | POA: Insufficient documentation

## 2013-07-22 DIAGNOSIS — I4729 Other ventricular tachycardia: Secondary | ICD-10-CM

## 2013-07-22 DIAGNOSIS — I749 Embolism and thrombosis of unspecified artery: Secondary | ICD-10-CM

## 2013-07-22 DIAGNOSIS — I82890 Acute embolism and thrombosis of other specified veins: Secondary | ICD-10-CM | POA: Insufficient documentation

## 2013-07-22 MED ORDER — CARVEDILOL 6.25 MG PO TABS: ORAL_TABLET | ORAL | Status: DC

## 2013-07-22 NOTE — Patient Instructions (Signed)
Increase your coreg to 9.375 mg (1 1/2 tablets) in the morning and 9.375 mg (1 1/2 tablets) in the evening.  Call any issues.  Have fun on vacation.  Will get digoxin level at Coumadin appointment.   Follow up in 3 weeks for medication titration  Do the following things EVERYDAY: 1) Weigh yourself in the morning before breakfast. Write it down and keep it in a log. 2) Take your medicines as prescribed 3) Eat low salt foods-Limit salt (sodium) to 2000 mg per day.  4) Stay as active as you can everyday 5) Limit all fluids for the day to less than 2 liters 6)

## 2013-07-28 ENCOUNTER — Telehealth: Payer: Self-pay | Admitting: *Deleted

## 2013-07-28 DIAGNOSIS — G4733 Obstructive sleep apnea (adult) (pediatric): Secondary | ICD-10-CM

## 2013-07-28 NOTE — Sleep Study (Addendum)
[facility]   NAME: [name]  DATE OF BIRTH: 04-Sep-1970  MEDICAL RECORD [id number]  LOCATION: [facility]   PHYSICIAN: Macallan Ord V.   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   He was found to have severe nonischemic cardio myopathy.He has had symptoms of somnolence and snoring for many years.  At the time of this study ,they weighed 195 pounds with a height of 5 ft 11 inches and the BMI of 27, neck size of 17 inches. Epworth sleepiness score was 11   This nocturnal polysomnogram was performed with a sleep technologist in attendance. EEG, EOG,EMG and respiratory parameters recorded. Sleep stages, arousals, limb movements and respiratory data was scored according to criteria laid out by the American Academy of sleep medicine.   SLEEP ARCHITECTURE: Lights out was at 2259 PM and lights on was at 505 AM. Total sleep time was 238 minutes with a sleep period time of 314 minutes and a sleep efficiency of 65 %. Sleep latency was 24 minutes with latency to REM sleep of 274 minutes and wake after sleep onset of 102 minutes. . Sleep stages as a percentage of total sleep time was N1 -18 %,N2- 74 % and REM sleep 8 % ( 19 minutes) . The longest period of REM sleep was around 4 AM.   AROUSAL DATA : There were 23  arousals with an arousal index of 6 events per hour. Most of these were spontaneous & 0 were associated with respiratory events  RESPIRATORY DATA: There were 0 obstructive apneas, 0 central apneas, 0 mixed apneas and 28 hypopneas with apnea -hypopnea index of 7 events per hour. There were 0 RERAs with an RDI of 28 events per hour. There was no relation to sleep stage or body position. Supine sleep was  Noted. Events were mostly noted during supine and REM  sleep.  MOVEMENT/PARASOMNIA: There were 201 PLMS with a PLM index of 50 events per hour. The PLM arousal index was 1.8 per hour.  OXYGEN DATA: The lowest desaturation was 85 % during REM sleep and the desaturation index was 10 per hour.   CARDIAC DATA: The low heart rate was 34 beats per minute. The high heart rate recorded was an artifact. No arrhythmias were noted   DISCUSSION -Loud snoring was noted . he did not meet criteria for CPAP intervention. he was desensitized with a medium nasal pillows  IMPRESSION :  1. mild obstructive sleep apnea with predominant hypopneas causing sleep fragmentation and mild oxygen desaturation.  2. No evidence of cardiac arrhythmiasor behavioral disturbance during sleep.  3. Sleep efficiency was poor, likely due to first night effect 4. significant PLMS were noted, but these were not associated with arousals  RECOMMENDATION:  1. Treatment options for this degree of sleep disordered breathing include weight loss alone and/or CPAP therapy and/ or oral appliance.  2. Patient should be cautioned against driving when sleepy  3. They should be asked to avoid medications with sedative side effects  4. please correlate with a clinical history of restless leg syndrome   Oretha Milch MD Diplomate, American Board of Sleep Medicine    ELECTRONICALLY SIGNED ON: 07/28/2013  [facility]  PH: [phone] FX: [phone]  ACCREDITED BY THE AMERICAN ACADEMY OF SLEEP MEDICINE

## 2013-07-28 NOTE — Telephone Encounter (Signed)
Called spoke with pt. He is scheduled to come in and see RA 08/06/13. Nothing further needed

## 2013-07-28 NOTE — Telephone Encounter (Signed)
Message copied by Tommie Sams on Mon Jul 28, 2013  2:14 PM ------      Message from: Cyril Mourning V      Created: Mon Jul 28, 2013 11:38 AM       OV to discuss PSG results, can add on ------

## 2013-08-01 ENCOUNTER — Ambulatory Visit (INDEPENDENT_AMBULATORY_CARE_PROVIDER_SITE_OTHER): Payer: BC Managed Care – PPO | Admitting: *Deleted

## 2013-08-01 DIAGNOSIS — Z7901 Long term (current) use of anticoagulants: Secondary | ICD-10-CM

## 2013-08-01 DIAGNOSIS — I749 Embolism and thrombosis of unspecified artery: Secondary | ICD-10-CM

## 2013-08-01 DIAGNOSIS — I509 Heart failure, unspecified: Secondary | ICD-10-CM

## 2013-08-01 LAB — BASIC METABOLIC PANEL
BUN: 12 mg/dL (ref 6–23)
CO2: 26 mEq/L (ref 19–32)
Calcium: 9.4 mg/dL (ref 8.4–10.5)
Chloride: 105 mEq/L (ref 96–112)
Creatinine, Ser: 0.9 mg/dL (ref 0.4–1.5)
GFR: 95.11 mL/min (ref >60.00)
Glucose, Bld: 91 mg/dL (ref 70–99)
POTASSIUM: 4.1 meq/L (ref 3.5–5.1)
SODIUM: 138 meq/L (ref 135–145)

## 2013-08-01 LAB — POCT INR: INR: 2.2

## 2013-08-02 LAB — DIGOXIN LEVEL: DIGOXIN LVL: 1.2 ng/mL (ref 0.8–2.0)

## 2013-08-04 ENCOUNTER — Telehealth (HOSPITAL_COMMUNITY): Payer: Self-pay

## 2013-08-04 NOTE — Telephone Encounter (Signed)
Lab results reviewed with patient, instructed to stop digoxin.  Aware and agreeable.  Notification to DC medication sent to pharmacy electronically.

## 2013-08-06 ENCOUNTER — Encounter: Payer: Self-pay | Admitting: Pulmonary Disease

## 2013-08-06 ENCOUNTER — Ambulatory Visit (INDEPENDENT_AMBULATORY_CARE_PROVIDER_SITE_OTHER): Payer: BC Managed Care – PPO | Admitting: Pulmonary Disease

## 2013-08-06 VITALS — BP 112/64 | HR 64 | Wt 206.0 lb

## 2013-08-06 DIAGNOSIS — G4730 Sleep apnea, unspecified: Secondary | ICD-10-CM

## 2013-08-06 NOTE — Progress Notes (Signed)
   Subjective:    Patient ID: [name], male    DOB: 1970/10/22, 43 y.o.   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  Admitted in feb 2015with several weeks of dyspnea, weight gain and fluid retention. He was found to have severe nonischemic cardio myopathy.Echo LVEF 10% with global HK, large LV thrombus and severe RV dysfunction . He was treated with milrinone and heart failure medications, placed  On lifevest and has followup at the heart failure clinic  Used cpap in hospital except last 2 nights  He diuresed 40 pounds in the hospital, and wife reports a dramatic decrease in his snoring and witnessed apneas.  He had symptoms of somnolence and snoring for about 7 years. Epworth sleepiness score is now 6/24  He went into the hospital at 222 pounds and was discharged at 198 pounds.  PSH AHI 7/h, PLM 50/h but PLM arousal only 2/h, loud snoring, desatn 85% lowest   Review of Systems neg for any significant sore throat, dysphagia, itching, sneezing, nasal congestion or excess/ purulent secretions, fever, chills, sweats, unintended wt loss, pleuritic or exertional cp, hempoptysis, orthopnea pnd or change in chronic leg swelling. Also denies presyncope, palpitations, heartburn, abdominal pain, nausea, vomiting, diarrhea or change in bowel or urinary habits, dysuria,hematuria, rash, arthralgias, visual complaints, headache, numbness weakness or ataxia.     Objective:   Physical Exam  Gen. Pleasant, well-nourished, in no distress ENT - no lesions, no post nasal drip Neck: No JVD, no thyromegaly, no carotid bruits Lungs: no use of accessory muscles, no dullness to percussion, clear without rales or rhonchi  Cardiovascular: Rhythm regular, heart sounds  normal, no murmurs or gallops, no peripheral edema Musculoskeletal: No deformities, no cyanosis or clubbing         Assessment & Plan:

## 2013-08-06 NOTE — Assessment & Plan Note (Addendum)
You have mild obstructive sleep apnea  Call me if you have weight gain or breathing issues during sleep You also have leg movements but these are not waking you up or affecting your sleep  Since symptomatically better, wife & he do not desire treatment at this time - Ok with me given mild disease

## 2013-08-06 NOTE — Patient Instructions (Signed)
You have mild obstructive sleep apnea  Call me if you have weight gain or breathing issues during sleep You also have leg movements but these are not waking you up or affecting your sleep

## 2013-08-12 NOTE — Progress Notes (Signed)
Patient ID: [name], male   DOB: 01/01/1971, 43 y.o.   MRN: [id number]  PCP: None Pulmonary: Dr Vassie LollAlva  HPI: [name] is a 43 year old with a history of ETOH consumption, obesity, and newly diagnosed acute systolic heart failure 05/31/13. Ad[facility] 05/31/13 with dyspnea and lower extremity edema which was from acute systolic heart failure. Echo LVEF 10% with global HK, large LV thrombus and severe RV dysfunction.  LHC 05/2013 normal cors. Required short course of Milrinone. He was discharged on carvedilol 3.125 mg twice a day,  lisinopril 5 mg twice a day, digoxin 0.25 mg dialy,  Coumadin, and 12.5 mg spironolactone. He was discharged with lifevest.  Discharge weight was 191 pounds.   He returns for follow up. Last visit carvedilol was increased to 9.375 mg twice a day. Per Dr Vassie LollAlva he has mild sleep apnea but does not require intervention at this time. Denies SOB/PND/Orthopnea. Does admit to fatiuge when he carries a ladder.  Able to walk 20-30 minutes 3-4 times a week. After walking 20-30 minutes he needs a break. Weight at home 197-199 pounds. He says he is taking 1-2 lasix a week for lower extremity edema. Compliant with medications. No bleeding problems.  Continues to wear life-vest. No alarms. Denies alcohol.     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  Past Medical History  Diagnosis Date  . Hypertension   . Athlete's foot     Current Outpatient Prescriptions  Medication Sig Dispense Refill  . carvedilol (COREG) 6.25 MG tablet Take 9.375 mg (1 1/2 tablets) in the morning and 9.375 mg (1 1/2 tablets) in the evening.  90 tablet   3  . citalopram (CELEXA) 10 MG tablet Take 1 tablet (10 mg total) by mouth daily.  30 tablet  6  . furosemide (LASIX) 20 MG tablet Take 20 mg by mouth as needed (For weight greater than 200lb).       Marland Kitchen. lisinopril (PRINIVIL,ZESTRIL) 5 MG tablet Take 1 tablet (5 mg total) by mouth 2 (two) times daily.  60 tablet  6  . Multiple Vitamin (MULTIVITAMIN) tablet Take 1 tablet by mouth daily.      Marland Kitchen. spironolactone (ALDACTONE) 25 MG tablet Take 1 tablet (25 mg total) by mouth daily.  30 tablet  3  . warfarin (COUMADIN) 4 MG tablet Take 1 tablet (4 mg total) by mouth daily.  45 tablet  6   No current facility-administered medications for this encounter.    Filed Vitals:   08/13/13 0844  BP: 119/82  Pulse: 72  Resp: 16  Weight: 203 lb 2 oz (92.137 kg)  SpO2: 100%    PHYSICAL EXAM: General:  Well appearing. No resp difficulty Wife present  HEENT: normal Neck: supple. JVP 5-6. Carotids 2+ bilaterally; no bruits. No lymphadenopathy or thryomegaly appreciated. Cor: PMI normal. Regular rate & rhythm. No rubs, gallops or murmurs. No S3. Lifevest on.  Lungs: clear Abdomen: soft, nontender, nondistended. No hepatosplenomegaly. No bruits or masses. Good bowel sounds. Extremities: no cyanosis, clubbing, rash, edema Neuro: alert & orientedx3,  cranial nerves grossly intact. Moves all 4 extremities w/o difficulty. Affect pleasant.   ASSESSMENT & PLAN:  1. Chronic Systolic Heart Failure: NICM, EF 20-25% (bedisde ECHO 06/2013) - Over all he is dong well. NYHA II symptoms. Volume  status stable. Continue lasix as needed for weight > 200 pounds. Continue 25 mg spironolactone daily.  - Increase coreg to 12.5 mg twice a day. call any dizziness. - Continue lisinopril 5 mg twice a day.  - Dig stopped last visit due to dig level 1.2 .  - Continue LifeVest and will repeat ECHO next month. If EF remains less than 35% will refer to EP for ICD. QRS narrow so no CRT-D. - Reinforced the need and importance of daily  weights, a low sodium diet, and fluid restriction (less than 2 L a day). Instructed to call the HF clinic if weight increases more than 3 lbs overnight or 5 lbs in a week. 2. ETOH-  He remains off alcohol.  3. NSVT- continue lifevest until repeat ECHO next month.  4. 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 [facility]. Will reassess with next ECHO 5. Mild OSA- Per Dr Vassie Loll mild sleep apnea. No intervention needed at this time.     Follow up in 3 weeks with an ECHO and Dr Gala Romney.   Amy D Clegg NP-C 8:52 AM

## 2013-08-13 ENCOUNTER — Ambulatory Visit (HOSPITAL_COMMUNITY)
Admission: RE | Admit: 2013-08-13 | Discharge: 2013-08-13 | Disposition: A | Discharge status: Home / Self Care | Payer: BC Managed Care – PPO | Source: Ambulatory Visit | Attending: Internal Medicine | Admitting: Internal Medicine

## 2013-08-13 ENCOUNTER — Encounter (HOSPITAL_COMMUNITY): Payer: Self-pay

## 2013-08-13 VITALS — BP 119/82 | HR 72 | Resp 16 | Wt 203.1 lb

## 2013-08-13 DIAGNOSIS — I509 Heart failure, unspecified: Secondary | ICD-10-CM | POA: Insufficient documentation

## 2013-08-13 DIAGNOSIS — I5022 Chronic systolic (congestive) heart failure: Secondary | ICD-10-CM

## 2013-08-13 DIAGNOSIS — E669 Obesity, unspecified: Secondary | ICD-10-CM | POA: Insufficient documentation

## 2013-08-13 DIAGNOSIS — Z7901 Long term (current) use of anticoagulants: Secondary | ICD-10-CM | POA: Insufficient documentation

## 2013-08-13 DIAGNOSIS — I472 Ventricular tachycardia: Secondary | ICD-10-CM

## 2013-08-13 DIAGNOSIS — Z86718 Personal history of other venous thrombosis and embolism: Secondary | ICD-10-CM | POA: Insufficient documentation

## 2013-08-13 DIAGNOSIS — G4730 Sleep apnea, unspecified: Secondary | ICD-10-CM | POA: Insufficient documentation

## 2013-08-13 DIAGNOSIS — I4729 Other ventricular tachycardia: Secondary | ICD-10-CM

## 2013-08-13 DIAGNOSIS — I10 Essential (primary) hypertension: Secondary | ICD-10-CM | POA: Insufficient documentation

## 2013-08-13 DIAGNOSIS — G4733 Obstructive sleep apnea (adult) (pediatric): Secondary | ICD-10-CM

## 2013-08-13 MED ORDER — CARVEDILOL 12.5 MG PO TABS: 12.5000 mg | ORAL_TABLET | Freq: Two times a day (BID) | ORAL | Status: DC

## 2013-08-13 NOTE — Patient Instructions (Signed)
Follow up 3 weeks with an ECHO   Take carvedlol 12.5 mg twice a day  Do the following things EVERYDAY: 1) Weigh yourself in the morning before breakfast. Write it down and keep it in a log. 2) Take your medicines as prescribed 3) Eat low salt foods-Limit salt (sodium) to 2000 mg per day.  4) Stay as active as you can everyday 5) Limit all fluids for the day to less than 2 liters

## 2013-08-19 ENCOUNTER — Encounter (HOSPITAL_COMMUNITY): Payer: Self-pay

## 2013-08-19 ENCOUNTER — Telehealth (HOSPITAL_COMMUNITY): Payer: Self-pay | Admitting: Cardiology

## 2013-08-19 NOTE — Telephone Encounter (Signed)
pts wife to inform Amy Clegg,NP  Life vest claim was denied And checking status of disability paperwork that was left last week  Please call wife back with advise about Life Vest

## 2013-08-29 ENCOUNTER — Ambulatory Visit (INDEPENDENT_AMBULATORY_CARE_PROVIDER_SITE_OTHER): Payer: BC Managed Care – PPO

## 2013-08-29 DIAGNOSIS — I749 Embolism and thrombosis of unspecified artery: Secondary | ICD-10-CM

## 2013-08-29 DIAGNOSIS — Z7901 Long term (current) use of anticoagulants: Secondary | ICD-10-CM

## 2013-08-29 LAB — POCT INR: INR: 2.2

## 2013-09-03 ENCOUNTER — Encounter (HOSPITAL_COMMUNITY): Payer: Self-pay

## 2013-09-03 ENCOUNTER — Ambulatory Visit (HOSPITAL_BASED_OUTPATIENT_CLINIC_OR_DEPARTMENT_OTHER)
Admission: RE | Admit: 2013-09-03 | Discharge: 2013-09-03 | Disposition: A | Discharge status: Home / Self Care | Payer: BC Managed Care – PPO | Source: Ambulatory Visit | Attending: Internal Medicine | Admitting: Internal Medicine

## 2013-09-03 ENCOUNTER — Ambulatory Visit (HOSPITAL_COMMUNITY)
Admission: RE | Admit: 2013-09-03 | Discharge: 2013-09-03 | Disposition: A | Discharge status: Home / Self Care | Payer: BC Managed Care – PPO | Source: Ambulatory Visit | Attending: Family Medicine | Admitting: Family Medicine

## 2013-09-03 VITALS — BP 94/60 | HR 60 | Wt 199.2 lb

## 2013-09-03 DIAGNOSIS — I059 Rheumatic mitral valve disease, unspecified: Secondary | ICD-10-CM

## 2013-09-03 DIAGNOSIS — I5022 Chronic systolic (congestive) heart failure: Secondary | ICD-10-CM | POA: Insufficient documentation

## 2013-09-03 DIAGNOSIS — I472 Ventricular tachycardia: Secondary | ICD-10-CM

## 2013-09-03 DIAGNOSIS — I513 Intracardiac thrombosis, not elsewhere classified: Secondary | ICD-10-CM

## 2013-09-03 DIAGNOSIS — I219 Acute myocardial infarction, unspecified: Secondary | ICD-10-CM

## 2013-09-03 DIAGNOSIS — I4729 Other ventricular tachycardia: Secondary | ICD-10-CM

## 2013-09-03 NOTE — Progress Notes (Signed)
  Echocardiogram 2D Echocardiogram has been performed.  [name], [name] 09/03/2013, 10:53 AM

## 2013-09-03 NOTE — Progress Notes (Signed)
Patient ID: [name], male   DOB: 02/04/1971, 43 y.o.   MRN: [id number]  PCP: None Pulmonary: Dr Vassie LollAlva  HPI: [name] is a 43 year old with a history of ETOH consumption, obesity, and newly diagnosed acute systolic heart failure 05/31/13. Ad[facility] with dyspnea and lower extremity edema which was from acute systolic heart failure. Echo LVEF 10% with global HK, large LV thrombus and severe RV dysfunction.  LHC 05/2013 normal cors. Required short course of Milrinone. He was discharged on carvedilol 3.125 mg twice a day,  lisinopril 5 mg twice a day, digoxin 0.25 mg dialy,  Coumadin, and 12.5 mg spironolactone. He was discharged with lifevest.  Discharge weight was 191 pounds.   Follow up for Heart Failure: Last visit coreg increased to 12.5 mg BID which he tolerated and digoxin was stopped d/t high dig level. Had his first baby this past weekend. Has noticed increased fatigue with coreg. Denies SOB, orthopnea, PND or CP. Walking 2 1/2 miles almost daily and having no issues with going up hills. Weight at home 191-194 lbs. Wearing LifeVest and no alarms. Has not needed any lasix in about 1 month. Taking medications as prescribed.    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  Past Medical History  Diagnosis Date  . Hypertension   . Athlete's foot     Current Outpatient Prescriptions  Medication Sig Dispense Refill  . carvedilol (COREG) 12.5 MG tablet Take 1 tablet (12.5 mg total) by mouth 2 (two) times daily with a meal.  60 tablet  6  . citalopram (CELEXA) 10 MG tablet Take 1  tablet (10 mg total) by mouth daily.  30 tablet  6  . furosemide (LASIX) 20 MG tablet Take 20 mg by mouth as needed (For weight greater than 200lb).       Marland Kitchen. lisinopril (PRINIVIL,ZESTRIL) 5 MG tablet Take 1 tablet (5 mg total) by mouth 2 (two) times daily.  60 tablet  6  . Multiple Vitamin (MULTIVITAMIN) tablet Take 1 tablet by mouth daily.      Marland Kitchen. spironolactone (ALDACTONE) 25 MG tablet Take 1 tablet (25 mg total) by mouth daily.  30 tablet  3  . warfarin (COUMADIN) 4 MG tablet Take 1 tablet (4 mg total) by mouth daily.  45 tablet  6   No current facility-administered medications for this encounter.    Filed Vitals:   09/03/13 1023  BP: 98/64  Pulse: 66  Weight: 199 lb 4 oz (90.379 kg)  SpO2: 98%    PHYSICAL EXAM: General:  Well appearing. No resp difficulty Wife present  HEENT: normal Neck: supple. JVP 5-6. Carotids 2+ bilaterally; no bruits. No lymphadenopathy or thryomegaly appreciated. Cor: PMI normal. Regular rate & rhythm. No rubs, gallops or murmurs. No S3. Lifevest on.  Lungs: clear Abdomen: soft, nontender, nondistended. No hepatosplenomegaly. No bruits or masses. Good bowel sounds. Extremities: no cyanosis, clubbing, rash, edema Neuro: alert & orientedx3, cranial nerves grossly intact. Moves all 4 extremities w/o difficulty. Affect  pleasant.   ASSESSMENT & PLAN:  1. Chronic Systolic Heart Failure: NICM, EF 25-30%  - Overall he is doing very well. NYHA I symptoms. Volume  status stable. Continue lasix as needed for weight > 200 pounds. Continue 25 mg spironolactone daily.  - EF improving steadily but slowly EF 25-30% range - Continue carvedilol 12.5 bid - having fatigue with this. Will have him take 6.25 at breakfast. 6.25 at lunch at 12.5 at bedtime - Continue lisinopril 5 mg twice a day.  - Continue LifeVest for now. Consider cMRI in 6 weeks - Reinforced the need and importance of daily weights, a low sodium diet, and fluid restriction (less than 2 L a day). Instructed  to call the HF clinic if weight increases more than 3 lbs overnight or 5 lbs in a week. 2. ETOH-  He remains off alcohol.  3. NSVT- continue lifevest  4. LV thrombus- resolved. Stop warfarin. Take ECASA 325 daily for now. Wills top as EF continues to recover 5. Mild OSA- Per Dr Vassie Loll mild sleep apnea. No intervention needed at this time.     Follow up in 4 weeks.   [name] B NP-C 10:27 AM  Patient seen and examined with [name], NP. We discussed all aspects of the encounter. I agree with the assessment and plan as stated above.   I reviewed echo and edited note with my changes.   [name],MD 10:52 AM

## 2013-09-08 ENCOUNTER — Telehealth: Payer: Self-pay

## 2013-09-08 NOTE — Telephone Encounter (Addendum)
New patient appointment.   Previous PCP:  Dr. Milinda Antis  Medication List and allergies: Reviewed and updated  90 day supply/mail order: n/a Local prescriptions:   OUTPATIENT PHARMACY - [place], [place] - [address]  Immunization due:  UTD  A/P: Personal, family and PSH: Reviewed and updated Flu- 06/03/13 Tdap- 10/02/08   To discuss with provider:  Nothing at this time.

## 2013-09-09 ENCOUNTER — Encounter: Payer: Self-pay | Admitting: Family Medicine

## 2013-09-09 ENCOUNTER — Ambulatory Visit (INDEPENDENT_AMBULATORY_CARE_PROVIDER_SITE_OTHER): Payer: BC Managed Care – PPO | Admitting: Family Medicine

## 2013-09-09 VITALS — BP 116/78 | HR 74 | Temp 98.2°F | Resp 16 | Ht 71.0 in | Wt 201.5 lb

## 2013-09-09 DIAGNOSIS — K7200 Acute and subacute hepatic failure without coma: Secondary | ICD-10-CM

## 2013-09-09 DIAGNOSIS — G4733 Obstructive sleep apnea (adult) (pediatric): Secondary | ICD-10-CM

## 2013-09-09 DIAGNOSIS — I5022 Chronic systolic (congestive) heart failure: Secondary | ICD-10-CM

## 2013-09-09 LAB — HEPATIC FUNCTION PANEL
ALT: 31 U/L (ref 0–53)
AST: 23 U/L (ref 0–37)
Albumin: 4.7 g/dL (ref 3.5–5.2)
Alkaline Phosphatase: 59 U/L (ref 39–117)
BILIRUBIN TOTAL: 0.9 mg/dL (ref 0.2–1.2)
Bilirubin, Direct: 0.1 mg/dL (ref 0.0–0.3)
Total Protein: 7.7 g/dL (ref 6.0–8.3)

## 2013-09-09 NOTE — Patient Instructions (Addendum)
Schedule your complete physical in 6 months We'll notify you of your lab results and make any changes if needed Call with ANY questions or concerns Keep up the good work, you look great! Welcome!  We're glad to have you! You are a miracle- and so is your son!  Congrats to you all!!

## 2013-09-09 NOTE — Assessment & Plan Note (Signed)
New.  Following w/ CHF team at [facility], lead by Dr Gala Romney.  EF has improved from 10% --> 30%.  If EF gets to >35%, pt is able to stop wearing his life vest.  Pt is currently asymptomatic.  Doing well.  Feeling much better than 3 months ago.  Will continue to follow along and assist as able.

## 2013-09-09 NOTE — Progress Notes (Signed)
   Subjective:    Patient ID: [name], male    DOB: July 07, 1970, 43 y.o.   MRN: [id number]  HPI New to establish.  Previous MD- [facility]  CHF- pt was admitted on 3/14 w/ severe shortness of breath, swelling.  Found to have EF of 10% and was admitted for 10 day hospitalization during which he diuresed 40lbs.  Most recent ECHO last week showed EF now 25-30%.  Following w/ Dr Gala Romney.  It is suspected that pt's sxs were caused by viral syndrome.  Was also found to have large LV thrombus- this was gone at most recent ECHO.  Now off coumadin, on ASA 81 mg daily.  On Lisinopril, Lasix, Coreg, Spironolactone.  Currently wearing defibrillator vest.  Denies CP, edema, SOB.  Walking 2 miles daily.  Liver failure- labs in March showed AST 331, ALT 1003.  No recent labs.  OSA- mild, seeing Dr Vassie Loll.  Not currently being treated.   Review of Systems For ROS see HPI     Objective:   Physical Exam  Vitals reviewed. Constitutional: He is oriented to person, place, and time. He appears well-developed and well-nourished. No distress.  HENT:  Head: Normocephalic and atraumatic.  Eyes: Conjunctivae and EOM are normal. Pupils are equal, round, and reactive to light.  Neck: Normal range of motion. Neck supple. No thyromegaly present.  Cardiovascular: Normal rate, regular rhythm, normal heart sounds and intact distal pulses.   No murmur heard. Pulmonary/Chest: Effort normal and breath sounds normal. No respiratory distress.  Abdominal: Soft. Bowel sounds are normal. He exhibits no distension.  Musculoskeletal: He exhibits no edema.  Lymphadenopathy:    He has no cervical adenopathy.  Neurological: He is alert and oriented to person, place, and time. No cranial nerve deficit.  Skin: Skin is warm and dry.  Psychiatric: He has a normal mood and affect. His behavior is normal.          Assessment & Plan:

## 2013-09-09 NOTE — Progress Notes (Signed)
Pre visit review using our clinic review tool, if applicable. No additional management support is needed unless otherwise documented below in the visit note. 

## 2013-09-09 NOTE — Assessment & Plan Note (Signed)
New to provider.  Suspect his liver failure was due to shock liver in setting of severe CHF.  Pt has not had recent liver function panel- will get today to assess if this has returned to baseline.  Will follow.

## 2013-09-09 NOTE — Assessment & Plan Note (Signed)
New to [name].  Pt following w/ Dr Vassie Loll.  No need for CPAP at this time as pt's sxs dramatically improved as CHF was appropriately treated.  Will follow.

## 2013-09-30 NOTE — Progress Notes (Signed)
Patient ID: [name], male   DOB: May 25, 1970, 43 y.o.   MRN: [id number]  PCP: None Pulmonary: Dr Vassie LollAlva  HPI: [name] is a 43 year old with a history of ETOH consumption, obesity, and newly diagnosed acute systolic heart failure 05/31/13.   [facility] with dyspnea and lower extremity edema which was from acute systolic heart failure. Echo LVEF 10% with global HK, large LV thrombus and severe RV dysfunction.  LHC 05/2013 normal cors. Required short course of Milrinone. He was discharged with lifevest.  Discharge weight was 191 pounds.   Follow up for Heart Failure: Last visit carvedilol was switched to 6.25 mg in am, 6.25 mg at lunch, and 12.5 mg in the evening. He tried that for 2 weeks but went back to 12.5 mg twice a day and he has tolerated this. Still with fatigue but he is managing.  Denies SOB/PND/Orthopnea Does admit to mild dyspnea with steps. Able to walk a total 2 1/2 mile at one time about 4 days a week. Taking all medications. Wearing lifevest. No alarms.    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  Past Medical History  Diagnosis Date  . Hypertension   . Athlete's foot   . CHF (congestive heart failure)     Current Outpatient Prescriptions  Medication Sig Dispense Refill  . aspirin EC 81 MG tablet Take 81 mg by mouth daily.      . carvedilol (COREG) 12.5 MG tablet Take 1 tablet (12.5 mg total) by mouth 2 (two) times daily with a meal.  60 tablet  6  . citalopram (CELEXA) 10 MG tablet Take 1 tablet (10 mg total) by mouth daily.  30 tablet  6  . furosemide  (LASIX) 20 MG tablet Take 20 mg by mouth as needed (For weight greater than 200lb).       Marland Kitchen. lisinopril (PRINIVIL,ZESTRIL) 5 MG tablet Take 1 tablet (5 mg total) by mouth 2 (two) times daily.  60 tablet  6  . Multiple Vitamin (MULTIVITAMIN) tablet Take 1 tablet by mouth daily.      Marland Kitchen. spironolactone (ALDACTONE) 25 MG tablet Take 1 tablet (25 mg total) by mouth daily.  30 tablet  3   No current facility-administered medications for this encounter.    Filed Vitals:   10/01/13 1112  BP: 113/74  Pulse: 68  Resp: 18  Weight: 204 lb 5 oz (92.676 kg)  SpO2: 98%    PHYSICAL EXAM: General:  Well appearing. No resp difficulty Wife present  HEENT: normal Neck: supple. JVP 5-6. Carotids 2+ bilaterally; no bruits. No lymphadenopathy or thryomegaly appreciated. Cor: PMI normal. Regular rate & rhythm. No rubs, gallops or murmurs. No S3. Lifevest on.  Lungs: clear Abdomen: soft, nontender, nondistended. No hepatosplenomegaly. No bruits or masses. Good bowel sounds. Extremities: no cyanosis, clubbing, rash, edema Neuro: alert & orientedx3, cranial nerves grossly intact. Moves all 4 extremities w/o difficulty. Affect pleasant.   ASSESSMENT & PLAN:  1. Chronic Systolic Heart Failure: NICM, ECHO  07/2013 EF 25-30%  - Overall he is doing very well. NYHA II  symptoms. Volume  status stable. Continue lasix as needed for weight > 200 pounds. Continue 25 mg spironolactone daily.  - EF improving steadily to 25-30%  - Continue carvedilol 12.5 bid - Still with mild fatigue.  - Continue lisinopril 5 mg twice a day. Increase night time lisinopril 10 mg in pm. Check BMET next week.  - Continue LifeVest for now. Schedule  cMRI in 4 weeks. If EF remains down will need to refer to EP for ICD.  - Reinforced the need and importance of daily weights, a low sodium diet, and fluid restriction (less than 2 L a day). Instructed to call the HF clinic if weight increases more than 3 lbs overnight or 5 lbs in a week.  2.  ETOH-  He remains off alcohol.  3. NSVT- continue lifevest repeat CMRI next week  4. LV thrombus- resolved. Take ECASA 325 daily for now. Will stop as EF continues to recover 5. Mild OSA- Per Dr Vassie Loll mild sleep apnea. No intervention needed at this time.     Follow up in 6 weeks with Dr Gala Romney   CLEGG,AMY NP-C 11:18 AM

## 2013-10-01 ENCOUNTER — Ambulatory Visit (HOSPITAL_COMMUNITY)
Admission: RE | Admit: 2013-10-01 | Discharge: 2013-10-01 | Disposition: A | Discharge status: Home / Self Care | Payer: BC Managed Care – PPO | Source: Ambulatory Visit | Attending: Adult Health | Admitting: Adult Health

## 2013-10-01 ENCOUNTER — Encounter (HOSPITAL_COMMUNITY): Payer: Self-pay

## 2013-10-01 ENCOUNTER — Encounter (HOSPITAL_COMMUNITY): Payer: BC Managed Care – PPO

## 2013-10-01 VITALS — BP 113/74 | HR 68 | Resp 18 | Wt 204.3 lb

## 2013-10-01 DIAGNOSIS — I472 Ventricular tachycardia, unspecified: Secondary | ICD-10-CM | POA: Insufficient documentation

## 2013-10-01 DIAGNOSIS — I10 Essential (primary) hypertension: Secondary | ICD-10-CM | POA: Insufficient documentation

## 2013-10-01 DIAGNOSIS — I5189 Other ill-defined heart diseases: Secondary | ICD-10-CM | POA: Insufficient documentation

## 2013-10-01 DIAGNOSIS — I5022 Chronic systolic (congestive) heart failure: Secondary | ICD-10-CM | POA: Insufficient documentation

## 2013-10-01 DIAGNOSIS — I4729 Other ventricular tachycardia: Secondary | ICD-10-CM | POA: Insufficient documentation

## 2013-10-01 DIAGNOSIS — G4733 Obstructive sleep apnea (adult) (pediatric): Secondary | ICD-10-CM | POA: Insufficient documentation

## 2013-10-01 DIAGNOSIS — Z7982 Long term (current) use of aspirin: Secondary | ICD-10-CM | POA: Insufficient documentation

## 2013-10-01 DIAGNOSIS — Z09 Encounter for follow-up examination after completed treatment for conditions other than malignant neoplasm: Secondary | ICD-10-CM | POA: Insufficient documentation

## 2013-10-01 MED ORDER — LISINOPRIL 5 MG PO TABS: ORAL_TABLET | ORAL | Status: DC

## 2013-10-01 NOTE — Patient Instructions (Signed)
Follow up in 6 weeks with Dr Gala Romney  Please follow up next week Thursday for lab work   Take lisinopril 5 mg in am and 10 mg in pm   Do the following things EVERYDAY: 1) Weigh yourself in the morning before breakfast. Write it down and keep it in a log. 2) Take your medicines as prescribed 3) Eat low salt foods-Limit salt (sodium) to 2000 mg per day.  4) Stay as active as you can everyday 5) Limit all fluids for the day to less than 2 liters

## 2013-10-07 ENCOUNTER — Telehealth (HOSPITAL_COMMUNITY): Payer: Self-pay | Admitting: Vascular Surgery

## 2013-10-07 NOTE — Telephone Encounter (Signed)
Pt cleared for cleaning, note faxed pt aware

## 2013-10-07 NOTE — Telephone Encounter (Signed)
Pt needs approval to get a deep cleaning Dr. Lindi Adie phone # [phone] fax# [phone]

## 2013-10-08 ENCOUNTER — Encounter: Payer: Self-pay | Admitting: Cardiology

## 2013-10-09 ENCOUNTER — Ambulatory Visit (HOSPITAL_COMMUNITY)
Admission: RE | Admit: 2013-10-09 | Discharge: 2013-10-09 | Disposition: A | Discharge status: Home / Self Care | Payer: BC Managed Care – PPO | Source: Ambulatory Visit | Attending: Cardiology | Admitting: Cardiology

## 2013-10-09 DIAGNOSIS — I5022 Chronic systolic (congestive) heart failure: Secondary | ICD-10-CM

## 2013-10-09 LAB — BASIC METABOLIC PANEL
Anion gap: 13 (ref 5–15)
BUN: 18 mg/dL (ref 6–23)
CALCIUM: 9.3 mg/dL (ref 8.4–10.5)
CO2: 23 meq/L (ref 19–32)
Chloride: 104 mEq/L (ref 96–112)
Creatinine, Ser: 0.86 mg/dL (ref 0.50–1.35)
GFR calc Af Amer: >90 mL/min (ref >90)
Glucose, Bld: 93 mg/dL (ref 70–99)
Potassium: 4.3 mEq/L (ref 3.7–5.3)
SODIUM: 140 meq/L (ref 137–147)

## 2013-10-28 ENCOUNTER — Ambulatory Visit (HOSPITAL_COMMUNITY)
Admission: RE | Admit: 2013-10-28 | Discharge: 2013-10-28 | Disposition: A | Discharge status: Home / Self Care | Payer: BC Managed Care – PPO | Source: Ambulatory Visit | Attending: Cardiology | Admitting: Cardiology

## 2013-10-28 DIAGNOSIS — I5022 Chronic systolic (congestive) heart failure: Secondary | ICD-10-CM

## 2013-10-28 DIAGNOSIS — I428 Other cardiomyopathies: Secondary | ICD-10-CM

## 2013-10-28 DIAGNOSIS — I509 Heart failure, unspecified: Secondary | ICD-10-CM | POA: Insufficient documentation

## 2013-10-28 MED ORDER — GADOBENATE DIMEGLUMINE 529 MG/ML IV SOLN
30.0000 mL | Freq: Once | INTRAVENOUS | Status: AC | PRN
  Administered 2013-10-28: 30 mL via INTRAVENOUS

## 2013-11-03 ENCOUNTER — Other Ambulatory Visit (HOSPITAL_COMMUNITY): Payer: Self-pay

## 2013-11-03 MED ORDER — SPIRONOLACTONE 25 MG PO TABS: 25.0000 mg | ORAL_TABLET | Freq: Every day | ORAL | Status: DC

## 2013-11-11 ENCOUNTER — Ambulatory Visit (HOSPITAL_COMMUNITY)
Admission: RE | Admit: 2013-11-11 | Discharge: 2013-11-11 | Disposition: A | Discharge status: Home / Self Care | Payer: BC Managed Care – PPO | Source: Ambulatory Visit | Attending: Cardiology | Admitting: Cardiology

## 2013-11-11 ENCOUNTER — Encounter (HOSPITAL_COMMUNITY): Payer: Self-pay

## 2013-11-11 VITALS — BP 108/70 | HR 76 | Wt 206.8 lb

## 2013-11-11 DIAGNOSIS — I472 Ventricular tachycardia, unspecified: Secondary | ICD-10-CM | POA: Insufficient documentation

## 2013-11-11 DIAGNOSIS — Z7982 Long term (current) use of aspirin: Secondary | ICD-10-CM | POA: Diagnosis not present

## 2013-11-11 DIAGNOSIS — I5022 Chronic systolic (congestive) heart failure: Secondary | ICD-10-CM | POA: Insufficient documentation

## 2013-11-11 DIAGNOSIS — G4733 Obstructive sleep apnea (adult) (pediatric): Secondary | ICD-10-CM | POA: Insufficient documentation

## 2013-11-11 DIAGNOSIS — F1011 Alcohol abuse, in remission: Secondary | ICD-10-CM | POA: Diagnosis not present

## 2013-11-11 DIAGNOSIS — I10 Essential (primary) hypertension: Secondary | ICD-10-CM | POA: Diagnosis not present

## 2013-11-11 DIAGNOSIS — I509 Heart failure, unspecified: Secondary | ICD-10-CM | POA: Insufficient documentation

## 2013-11-11 DIAGNOSIS — I4729 Other ventricular tachycardia: Secondary | ICD-10-CM | POA: Insufficient documentation

## 2013-11-11 DIAGNOSIS — I5189 Other ill-defined heart diseases: Secondary | ICD-10-CM | POA: Insufficient documentation

## 2013-11-11 MED ORDER — LISINOPRIL 10 MG PO TABS: 10.0000 mg | ORAL_TABLET | Freq: Two times a day (BID) | ORAL | Status: DC

## 2013-11-11 MED ORDER — CARVEDILOL 12.5 MG PO TABS: 18.7500 mg | ORAL_TABLET | Freq: Two times a day (BID) | ORAL | Status: DC

## 2013-11-11 NOTE — Patient Instructions (Addendum)
Increase Lisinopril to 10 mg Twice daily   Increase Carvedilol to 18.75 mg (1 & 1/2 tabs) Twice daily   Labs in 1 week (bmet)  Your physician recommends that you schedule a follow-up appointment in: 4 weeks

## 2013-11-11 NOTE — Addendum Note (Signed)
Encounter addended by: Noralee Space, RN on: 11/11/2013 10:37 AM<BR>     Documentation filed: Patient Instructions Section, Orders

## 2013-11-11 NOTE — Progress Notes (Signed)
Patient ID: [name], male   DOB: 04/21/1970, 43 y.o.   MRN: [id number]  PCP: None Pulmonary: Dr Vassie Loll  HPI: [name] is a 43 year old with a history of ETOH consumption, obesity, and newly diagnosed acute systolic heart failure 05/31/13.   Admitted to [facility] 05/31/13 with dyspnea and lower extremity edema which was from acute systolic heart failure. Echo LVEF 10% with global HK, large LV thrombus and severe RV dysfunction.  LHC 05/2013 normal cors. Required short course of Milrinone. He was discharged with lifevest.  Discharge weight was 191 pounds.   Follow up for Heart Failure: Doing great. Taking carvedilol 12.5 bid and lisinopril 10 bid. Feels great. Walked 5 miles yesterday without a problem. No edema. 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  Past Medical History  Diagnosis Date  . Hypertension   . Athlete's foot   . CHF (congestive heart failure)     Current Outpatient Prescriptions  Medication Sig Dispense Refill  . aspirin EC 81 MG tablet Take 81 mg by mouth daily.      . carvedilol (COREG) 12.5 MG tablet Take 1 tablet (12.5 mg total) by mouth 2 (two) times daily with a meal.  60 tablet  6  . citalopram (CELEXA) 10 MG tablet Take 1 tablet (10 mg total) by mouth daily.  30 tablet  6  . furosemide (LASIX) 20 MG tablet Take 20 mg by mouth as needed (For weight greater than 200lb).       Marland Kitchen lisinopril (PRINIVIL,ZESTRIL) 5 MG tablet Take 5 mg in am and 10 mg in pm  90 tablet  6  . Multiple  Vitamin (MULTIVITAMIN) tablet Take 1 tablet by mouth daily.      Marland Kitchen spironolactone (ALDACTONE) 25 MG tablet Take 1 tablet (25 mg total) by mouth daily.  30 tablet  3   No current facility-administered medications for this encounter.    Filed Vitals:   11/11/13 0950  BP: 108/70  Pulse: 76  Weight: 206 lb 12.8 oz (93.804 kg)  SpO2: 98%    PHYSICAL EXAM: General:  Well appearing. No resp difficulty Wife present  HEENT: normal Neck: supple. JVP 5-6. Carotids 2+ bilaterally; no bruits. No lymphadenopathy or thryomegaly appreciated. Cor: PMI normal. Regular rate & rhythm. No rubs, gallops or murmurs. No S3. Lifevest on.  Lungs: clear Abdomen: soft, nontender, nondistended. No hepatosplenomegaly. No bruits or masses. Good bowel sounds. Extremities: no cyanosis, clubbing, rash, edema Neuro: alert & orientedx3, cranial nerves grossly intact. Moves all 4 extremities w/o difficulty. Affect pleasant.   ASSESSMENT & PLAN:  1. Chronic Systolic Heart Failure: NICM, ECHO 07/2013 EF 25-30%  cMRI 8/15 EF 31% - Overall he is doing very well - nowNYHA I  symptoms. Volume  status stable. Continue lasix as needed for weight > 200 pounds. Continue 25 mg spironolactone daily.  - EF improving steadily to 30-35%  -  Increase carvedilol to 18.75 bid  - Continue lisinopril 10 mg twice a day. Increase night time lisinopril 10 mg in pm. Check BMET next week.  - Continue LifeVest for now. - With NYHA I symptoms in a NICM does not meet criteria for ICD currently. Will continue to follow for LV recovery. Continue aggressive titration of meds.   - Reinforced the need and importance of daily weights, a low sodium diet, and fluid restriction (less than 2 L a day). Instructed to call the HF clinic if weight increases more than 3 lbs overnight or 5 lbs in a week. 2. ETOH-  He remains off alcohol.  3. NSVT- continue lifevest 4. LV thrombus- resolved. Take ECASA 81 daily for now. Will stop as EF continues to recover 5.  Mild OSA- Per Dr Vassie Loll mild sleep apnea. No intervention needed at this time.     Follow up in 4 weeks. Can return to work as tolerated, may be best to start with PT and see how he does.    Arvilla Meres MD 10:15 AM

## 2013-11-18 ENCOUNTER — Ambulatory Visit (HOSPITAL_COMMUNITY)
Admission: RE | Admit: 2013-11-18 | Discharge: 2013-11-18 | Disposition: A | Discharge status: Home / Self Care | Payer: BC Managed Care – PPO | Source: Ambulatory Visit | Attending: Cardiology | Admitting: Cardiology

## 2013-11-18 DIAGNOSIS — I509 Heart failure, unspecified: Secondary | ICD-10-CM | POA: Diagnosis not present

## 2013-11-18 DIAGNOSIS — I5022 Chronic systolic (congestive) heart failure: Secondary | ICD-10-CM | POA: Insufficient documentation

## 2013-11-18 LAB — BASIC METABOLIC PANEL
ANION GAP: 14 (ref 5–15)
BUN: 12 mg/dL (ref 6–23)
CALCIUM: 9.2 mg/dL (ref 8.4–10.5)
CO2: 23 mEq/L (ref 19–32)
Chloride: 103 mEq/L (ref 96–112)
Creatinine, Ser: 0.83 mg/dL (ref 0.50–1.35)
GFR calc Af Amer: >90 mL/min (ref >90)
Glucose, Bld: 88 mg/dL (ref 70–99)
Potassium: 4.5 mEq/L (ref 3.7–5.3)
SODIUM: 140 meq/L (ref 137–147)

## 2013-12-09 ENCOUNTER — Encounter (HOSPITAL_COMMUNITY): Payer: Self-pay

## 2013-12-09 ENCOUNTER — Ambulatory Visit (HOSPITAL_COMMUNITY)
Admission: RE | Admit: 2013-12-09 | Discharge: 2013-12-09 | Disposition: A | Discharge status: Home / Self Care | Payer: BC Managed Care – PPO | Source: Ambulatory Visit | Attending: Internal Medicine | Admitting: Internal Medicine

## 2013-12-09 VITALS — BP 110/72 | HR 73 | Wt 208.2 lb

## 2013-12-09 DIAGNOSIS — I219 Acute myocardial infarction, unspecified: Secondary | ICD-10-CM

## 2013-12-09 DIAGNOSIS — E669 Obesity, unspecified: Secondary | ICD-10-CM | POA: Diagnosis not present

## 2013-12-09 DIAGNOSIS — I513 Intracardiac thrombosis, not elsewhere classified: Secondary | ICD-10-CM

## 2013-12-09 DIAGNOSIS — Z8249 Family history of ischemic heart disease and other diseases of the circulatory system: Secondary | ICD-10-CM | POA: Insufficient documentation

## 2013-12-09 DIAGNOSIS — G4733 Obstructive sleep apnea (adult) (pediatric): Secondary | ICD-10-CM | POA: Diagnosis not present

## 2013-12-09 DIAGNOSIS — I509 Heart failure, unspecified: Secondary | ICD-10-CM | POA: Diagnosis not present

## 2013-12-09 DIAGNOSIS — G4730 Sleep apnea, unspecified: Secondary | ICD-10-CM | POA: Insufficient documentation

## 2013-12-09 DIAGNOSIS — I5022 Chronic systolic (congestive) heart failure: Secondary | ICD-10-CM | POA: Insufficient documentation

## 2013-12-09 DIAGNOSIS — I10 Essential (primary) hypertension: Secondary | ICD-10-CM | POA: Insufficient documentation

## 2013-12-09 DIAGNOSIS — F1011 Alcohol abuse, in remission: Secondary | ICD-10-CM | POA: Insufficient documentation

## 2013-12-09 HISTORY — DX: Other cardiomyopathies: I42.8

## 2013-12-09 MED ORDER — LISINOPRIL 10 MG PO TABS: ORAL_TABLET | ORAL | Status: DC

## 2013-12-09 NOTE — Patient Instructions (Signed)
Doing great.  Tell the family hey and give the baby a kiss for [place]!!!!  Increase your lisinopril to 10 mg (1 tablet) in the morning and 15 mg (1 1/2 tablets) in the evening. Call any issues with dizziness or fatigue.  Follow up in 6 weeks with ECHO.  Do the following things EVERYDAY: 1) Weigh yourself in the morning before breakfast. Write it down and keep it in a log. 2) Take your medicines as prescribed 3) Eat low salt foods-Limit salt (sodium) to 2000 mg per day.  4) Stay as active as you can everyday 5) Limit all fluids for the day to less than 2 liters 6)

## 2013-12-09 NOTE — Progress Notes (Signed)
Patient ID: [name], male   DOB: [birth year], 43 y.o.   MRN: [id number]  PCP: None Pulmonary: Dr Vassie Loll  HPI: [name] is a 43 year old with a history of ETOH consumption, obesity, and newly diagnosed acute systolic heart failure 05/31/13.   Admitted to [facility] 05/31/13 with dyspnea and lower extremity edema which was from acute systolic heart failure. Echo LVEF 10% with global HK, large LV thrombus and severe RV dysfunction.  LHC 05/2013 normal cors. Required short course of Milrinone. He was discharged with lifevest.  Discharge weight was 191 pounds.   Follow up for Heart Failure: Last visit coreg increased to 18.75 mg BID, which he tolerated. No longer wearing LifeVest. Denies SOB, PND, orthopnea or CP. Feels great. Back at work FT since [employer] stage [employer]. He is doing light work and not having any issues. Walking when he can but having no issues. Taking medications as prescribed. Following low salt diet. 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  Past Medical History  Diagnosis Date  . Hypertension   . Athlete's foot   . CHF (congestive heart failure)     a) NICM b) ECHO (05/2013) EF 10%, diff HK, large mural thrombus, mild MR, LA sev. dilated, RA mildly dilated, mod TR c) [facility] (06/02/13): CVP 19, PA 41/25 (33), PCWP 29, Thermo CO/CI 2.4 / 1.1 d) cMRI EF 31%, no definite myocardial delayed enhancement   . NICM (nonischemic cardiomyopathy)     a) LHC (06/04/2013): normal coronary arteries    Current Outpatient  Prescriptions  Medication Sig Dispense Refill  . aspirin EC 81 MG tablet Take 81 mg by mouth daily.      . carvedilol (COREG) 12.5 MG tablet Take 1.5 tablets (18.75 mg total) by mouth 2 (two) times daily with a meal.  90 tablet  6  . citalopram (CELEXA) 10 MG tablet Take 1 tablet (10 mg total) by mouth daily.  30 tablet  6  . furosemide (LASIX) 20 MG tablet Take 20 mg by mouth as needed (For weight greater than 200lb).       Marland Kitchen lisinopril (PRINIVIL,ZESTRIL) 10 MG tablet Take 1 tablet (10 mg total) by mouth 2 (two) times daily.  60 tablet  6  . Multiple Vitamin (MULTIVITAMIN) tablet Take 1 tablet by mouth daily.      Marland Kitchen spironolactone (ALDACTONE) 25 MG tablet Take 1 tablet (25 mg total) by mouth daily.  30 tablet  3   No current facility-administered medications for this encounter.    Filed Vitals:   12/09/13 0918  BP: 110/72  Pulse: 73  Weight: 208 lb 4 oz (94.462 kg)  SpO2: 99%    PHYSICAL EXAM: General:  Well appearing. No resp difficulty;  HEENT: normal Neck: supple. JVP 5-6. Carotids 2+ bilaterally; no bruits. No lymphadenopathy or thryomegaly appreciated. Cor: PMI normal. Regular rate & rhythm. No rubs, gallops or murmurs. No S3.  Lungs: clear Abdomen: soft, nontender, nondistended. No hepatosplenomegaly. No bruits or masses. Good bowel sounds. Extremities: no cyanosis, clubbing, rash, edema Neuro: alert & orientedx3, cranial nerves grossly intact. Moves all 4 extremities w/o difficulty. Affect pleasant.   ASSESSMENT & PLAN:  1. Chronic Systolic Heart Failure: NICM, EF 25-30% (08/2013)  cMRI 8/15 EF 31% - NYHA I symptoms and volume status stable. Will continue lasix 2- mg PRN for weight >200 lbs. - Continue coreg 18.75 mg BID and spironolactone 25 mg daily.  - Will try to increase lisinopril to 10 mg q am and 15 mg q pm. Instructed to call if he has any dizziness. Will recheck BMEt in 7-10 days.   - EF improving steadily to 30-35%  - With NYHA I symptoms in a NICM does not  meet criteria for ICD currently. Will continue to follow for LV recovery. Continue aggressive titration of meds.   - Repeat ECHO in 6 weeks. - Reinforced the need and importance of daily weights, a low sodium diet, and fluid restriction (less than 2 L a day). Instructed to call the HF clinic if weight increases more than 3 lbs overnight or 5 lbs in a week. 2. ETOH-  He remains off alcohol.  3. 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  4. LV thrombus- resolved. Take ECASA 81 daily for now. Will stop as EF continues to recover 5. Mild OSA- Per Dr Vassie Loll mild sleep apnea. No intervention needed at this time.    F/U 6 weeks with ECHO   [name] B NP-C 9:30 AM

## 2013-12-16 ENCOUNTER — Inpatient Hospital Stay (HOSPITAL_COMMUNITY): Admission: RE | Admit: 2013-12-16 | Payer: BC Managed Care – PPO | Source: Ambulatory Visit

## 2013-12-17 ENCOUNTER — Ambulatory Visit (HOSPITAL_COMMUNITY)
Admission: RE | Admit: 2013-12-17 | Discharge: 2013-12-17 | Disposition: A | Discharge status: Home / Self Care | Payer: BC Managed Care – PPO | Source: Ambulatory Visit | Attending: Internal Medicine | Admitting: Internal Medicine

## 2013-12-17 DIAGNOSIS — I5022 Chronic systolic (congestive) heart failure: Secondary | ICD-10-CM

## 2013-12-17 DIAGNOSIS — I509 Heart failure, unspecified: Secondary | ICD-10-CM | POA: Insufficient documentation

## 2013-12-17 LAB — BASIC METABOLIC PANEL
ANION GAP: 12 (ref 5–15)
BUN: 14 mg/dL (ref 6–23)
CALCIUM: 9.5 mg/dL (ref 8.4–10.5)
CO2: 25 mEq/L (ref 19–32)
Chloride: 102 mEq/L (ref 96–112)
Creatinine, Ser: 0.86 mg/dL (ref 0.50–1.35)
GFR calc Af Amer: >90 mL/min (ref >90)
Glucose, Bld: 100 mg/dL — ABNORMAL HIGH (ref 70–99)
Potassium: 4.3 mEq/L (ref 3.7–5.3)
SODIUM: 139 meq/L (ref 137–147)

## 2014-01-06 ENCOUNTER — Other Ambulatory Visit (HOSPITAL_COMMUNITY): Payer: Self-pay | Admitting: Cardiology

## 2014-01-06 MED ORDER — CITALOPRAM HYDROBROMIDE 10 MG PO TABS: 10.0000 mg | ORAL_TABLET | Freq: Every day | ORAL | Status: DC

## 2014-01-21 ENCOUNTER — Ambulatory Visit (HOSPITAL_BASED_OUTPATIENT_CLINIC_OR_DEPARTMENT_OTHER)
Admission: RE | Admit: 2014-01-21 | Discharge: 2014-01-21 | Disposition: A | Discharge status: Home / Self Care | Payer: BC Managed Care – PPO | Source: Ambulatory Visit | Attending: Internal Medicine | Admitting: Internal Medicine

## 2014-01-21 ENCOUNTER — Encounter (HOSPITAL_COMMUNITY): Payer: Self-pay

## 2014-01-21 ENCOUNTER — Ambulatory Visit (HOSPITAL_COMMUNITY)
Admission: RE | Admit: 2014-01-21 | Discharge: 2014-01-21 | Disposition: A | Discharge status: Home / Self Care | Payer: BC Managed Care – PPO | Source: Ambulatory Visit | Attending: Family Medicine | Admitting: Family Medicine

## 2014-01-21 VITALS — BP 112/76 | HR 71 | Wt 212.0 lb

## 2014-01-21 DIAGNOSIS — I5022 Chronic systolic (congestive) heart failure: Secondary | ICD-10-CM | POA: Diagnosis not present

## 2014-01-21 DIAGNOSIS — I213 ST elevation (STEMI) myocardial infarction of unspecified site: Secondary | ICD-10-CM

## 2014-01-21 DIAGNOSIS — I472 Ventricular tachycardia: Secondary | ICD-10-CM | POA: Diagnosis not present

## 2014-01-21 DIAGNOSIS — G4733 Obstructive sleep apnea (adult) (pediatric): Secondary | ICD-10-CM | POA: Diagnosis not present

## 2014-01-21 DIAGNOSIS — I519 Heart disease, unspecified: Secondary | ICD-10-CM

## 2014-01-21 DIAGNOSIS — I513 Intracardiac thrombosis, not elsewhere classified: Secondary | ICD-10-CM

## 2014-01-21 DIAGNOSIS — I4729 Other ventricular tachycardia: Secondary | ICD-10-CM

## 2014-01-21 LAB — BASIC METABOLIC PANEL
ANION GAP: 11 (ref 5–15)
BUN: 15 mg/dL (ref 6–23)
CALCIUM: 9.8 mg/dL (ref 8.4–10.5)
CO2: 25 mEq/L (ref 19–32)
Chloride: 102 mEq/L (ref 96–112)
Creatinine, Ser: 0.87 mg/dL (ref 0.50–1.35)
GFR calc Af Amer: >90 mL/min (ref >90)
GFR calc non Af Amer: >90 mL/min (ref >90)
Glucose, Bld: 97 mg/dL (ref 70–99)
Potassium: 4.3 mEq/L (ref 3.7–5.3)
SODIUM: 138 meq/L (ref 137–147)

## 2014-01-21 MED ORDER — SACUBITRIL-VALSARTAN 49-51 MG PO TABS: 1.0000 | ORAL_TABLET | Freq: Two times a day (BID) | ORAL | Status: DC

## 2014-01-21 NOTE — Progress Notes (Signed)
Echo Lab  2D Echocardiogram completed.  Toshiye Kever L Kennidee Heyne, RDCS 01/21/2014 8:53 AM

## 2014-01-21 NOTE — Progress Notes (Addendum)
Patient ID: [name], male   DOB: Mar 22, 1970, 43 y.o.   MRN: [id number]  PCP: None Pulmonary: Dr Vassie Loll  HPI: [name] is a 43 year old with a history of ETOH consumption, obesity, and newly diagnosed acute systolic heart failure 05/31/13.   Admitted to [facility] 05/31/13 with dyspnea and lower extremity edema which was from acute systolic heart failure. Echo LVEF 10% with global HK, large LV thrombus and severe RV dysfunction.  LHC 05/2013 normal cors. Required short course of Milrinone. He was discharged with lifevest.  Discharge weight was 191 pounds.   Follow up for Heart Failure: Last visit lisinopril increased to 10 mg q am and 15 mg q pm which he tolerated. Doing well. Back at work with no issues. Has needed lasix only once since last visit. Denies SOB, PND, orthopnea or CP. Taking medications as prescribed. Able to do everything he wants to do with no issues. Following a low salt diet and drinking less than 2L a day. Coughing.   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  Past Medical History  Diagnosis Date  . Hypertension   . Athlete's foot   . CHF (congestive heart failure)     a) NICM b) ECHO (05/2013) EF 10%, diff HK, large mural thrombus, mild MR, LA sev. dilated, RA mildly dilated, mod TR c) Swan GC (06/02/13): CVP 19, PA 41/25 (33), PCWP 29, Thermo CO/CI 2.4 / 1.1 d) cMRI EF 31%, no definite myocardial delayed enhancement   . NICM (nonischemic cardiomyopathy)     a) LHC (06/04/2013): normal  coronary arteries    Current Outpatient Prescriptions  Medication Sig Dispense Refill  . aspirin EC 81 MG tablet Take 81 mg by mouth daily.    . carvedilol (COREG) 12.5 MG tablet Take 1.5 tablets (18.75 mg total) by mouth 2 (two) times daily with a meal. 90 tablet 6  . citalopram (CELEXA) 10 MG tablet Take 1 tablet (10 mg total) by mouth daily. 30 tablet 6  . furosemide (LASIX) 20 MG tablet Take 20 mg by mouth as needed (For weight gain 3 lbs or mroe overnight).     . lisinopril (PRINIVIL,ZESTRIL) 10 MG tablet Take 10 mg (1 tablet) in the morning and 15 mg (1 1/2 tablets) in the evening 75 tablet 6  . Multiple Vitamin (MULTIVITAMIN) tablet Take 1 tablet by mouth daily.    Marland Kitchen spironolactone (ALDACTONE) 25 MG tablet Take 1 tablet (25 mg total) by mouth daily. 30 tablet 3   No current facility-administered medications for this encounter.    Filed Vitals:   01/21/14 0904  BP: 112/76  Pulse: 71  Weight: 212 lb (96.163 kg)  SpO2: 99%    PHYSICAL EXAM: General:  Well appearing. No resp difficulty; wife present HEENT: normal Neck: supple. JVP flat. Carotids 2+ bilaterally; no bruits. No lymphadenopathy or thryomegaly appreciated. Cor: PMI normal. Regular rate & rhythm. No rubs, gallops  or murmurs. No S3.   Lungs: clear Abdomen: soft, nontender, nondistended. No hepatosplenomegaly. No bruits or masses. Good bowel sounds. Extremities: no cyanosis, clubbing, rash, edema Neuro: alert & orientedx3, cranial nerves grossly intact. Moves all 4 extremities w/o difficulty. Affect pleasant.   ASSESSMENT & PLAN:  1. Chronic Systolic Heart Failure: NICM, EF 25-30% (08/2013)  cMRI 8/15 EF 31% - NYHA I symptoms and volume status stable. Will continue lasix 20 mg PRN for weight gain of 3 lbs in a day or 5 lbs in a week.  - Continue coreg 18.75 mg BID and spironolactone 25 mg daily.  - Will stop lisinopril and start Entresto 49-51 (1 tablet) BID starting Friday. Told to call if any dizziness or issues  with insurance. Check BMET today and in 7-10 days.  - ECHO today and briefly reviewed by Dr. Shirlee LatchMcLean, EF remains about 30-35%. Will wait for full report and then call patient.  - With NYHA I symptoms in a NICM does not meet criteria for ICD currently. Will continue to follow for LV recovery. Will order CPX test.  - Reinforced the need and importance of daily weights, a low sodium diet, and fluid restriction (less than 2 L a day). Instructed to call the HF clinic if weight increases more than 3 lbs overnight or 5 lbs in a week. 2. ETOH-  He remains off alcohol.  3. 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 Denies palpitations.  4. LV thrombus- resolved. Take ECASA 81 daily for now. Will stop as EF continues to recover 5. Mild OSA- Per Dr Vassie LollAlva mild sleep apnea. No intervention needed at this time.    F/U 1 month for continued medication titration  [name], [name] B NP-C 9:07 AM  Addendum: Dr. Gala RomneyBensimhon reviewed ECHO fully and felt EF 40% which is improved. Not sure if he will need CPX test.

## 2014-01-21 NOTE — Patient Instructions (Addendum)
Doing great.  Will call later with ECHO results.  Stop lisinopril today. Do not take afternoon dose.  Start Entresto 49-51 (1 tablet) twice a day on Friday. Call any dizziness.  Labs in 2 weeks.  Schedule CPX test  Follow up in 1 month with Dr. Gala Romney  Do the following things EVERYDAY: 1) Weigh yourself in the morning before breakfast. Write it down and keep it in a log. 2) Take your medicines as prescribed 3) Eat low salt foods-Limit salt (sodium) to 2000 mg per day.  4) Stay as active as you can everyday 5) Limit all fluids for the day to less than 2 liters 6)

## 2014-02-03 ENCOUNTER — Other Ambulatory Visit (HOSPITAL_COMMUNITY): Payer: BC Managed Care – PPO

## 2014-02-04 ENCOUNTER — Ambulatory Visit (HOSPITAL_COMMUNITY): Admission: RE | Admit: 2014-02-04 | Payer: BC Managed Care – PPO | Source: Ambulatory Visit

## 2014-02-05 ENCOUNTER — Other Ambulatory Visit (HOSPITAL_COMMUNITY): Payer: BC Managed Care – PPO

## 2014-02-06 ENCOUNTER — Ambulatory Visit (HOSPITAL_COMMUNITY)
Admission: RE | Admit: 2014-02-06 | Discharge: 2014-02-06 | Disposition: A | Discharge status: Home / Self Care | Payer: BC Managed Care – PPO | Source: Ambulatory Visit | Attending: Cardiology | Admitting: Cardiology

## 2014-02-06 DIAGNOSIS — I5022 Chronic systolic (congestive) heart failure: Secondary | ICD-10-CM

## 2014-02-06 LAB — BASIC METABOLIC PANEL
Anion gap: 13 (ref 5–15)
BUN: 16 mg/dL (ref 6–23)
CALCIUM: 9.6 mg/dL (ref 8.4–10.5)
CO2: 25 mEq/L (ref 19–32)
Chloride: 101 mEq/L (ref 96–112)
Creatinine, Ser: 0.92 mg/dL (ref 0.50–1.35)
GFR calc Af Amer: >90 mL/min (ref >90)
GLUCOSE: 98 mg/dL (ref 70–99)
Potassium: 3.9 mEq/L (ref 3.7–5.3)
SODIUM: 139 meq/L (ref 137–147)

## 2014-02-24 ENCOUNTER — Ambulatory Visit (HOSPITAL_COMMUNITY)
Admission: RE | Admit: 2014-02-24 | Discharge: 2014-02-24 | Disposition: A | Discharge status: Home / Self Care | Payer: BC Managed Care – PPO | Source: Ambulatory Visit | Attending: Internal Medicine | Admitting: Internal Medicine

## 2014-02-24 VITALS — BP 104/72 | HR 85 | Wt 218.8 lb

## 2014-02-24 DIAGNOSIS — I513 Intracardiac thrombosis, not elsewhere classified: Secondary | ICD-10-CM

## 2014-02-24 DIAGNOSIS — I5022 Chronic systolic (congestive) heart failure: Secondary | ICD-10-CM | POA: Diagnosis present

## 2014-02-24 DIAGNOSIS — I429 Cardiomyopathy, unspecified: Secondary | ICD-10-CM | POA: Diagnosis not present

## 2014-02-24 DIAGNOSIS — Z7982 Long term (current) use of aspirin: Secondary | ICD-10-CM | POA: Diagnosis not present

## 2014-02-24 DIAGNOSIS — Z79899 Other long term (current) drug therapy: Secondary | ICD-10-CM | POA: Diagnosis not present

## 2014-02-24 DIAGNOSIS — G4733 Obstructive sleep apnea (adult) (pediatric): Secondary | ICD-10-CM | POA: Diagnosis not present

## 2014-02-24 DIAGNOSIS — I213 ST elevation (STEMI) myocardial infarction of unspecified site: Secondary | ICD-10-CM

## 2014-02-24 DIAGNOSIS — I10 Essential (primary) hypertension: Secondary | ICD-10-CM | POA: Insufficient documentation

## 2014-02-24 DIAGNOSIS — I472 Ventricular tachycardia: Secondary | ICD-10-CM | POA: Insufficient documentation

## 2014-02-24 MED ORDER — CARVEDILOL 12.5 MG PO TABS: 25.0000 mg | ORAL_TABLET | Freq: Two times a day (BID) | ORAL | Status: DC

## 2014-02-24 NOTE — Progress Notes (Signed)
Patient ID: [name], male   DOB: 1970-06-06, 43 y.o.   MRN: [id number] PCP: None Pulmonary: Dr [name]  HPI: [name] is a 43 year old with a history of ETOH consumption, obesity, and newly diagnosed acute systolic heart failure 05/31/13.   Admitted to [facility] 05/31/13 with dyspnea and lower extremity edema which was from acute systolic heart failure. Echo LVEF 10% with global HK, large LV thrombus and severe RV dysfunction.  LHC 05/2013 normal cors. Required short course of Milrinone. He was discharged with lifevest.  Discharge weight was 191 pounds.   At last visit, he was started on Entresto and has done fine with this.  He works full time with no problems. He rarely uses Lasix, only taking it when he eats out in a restaurant and gets a lot of sodium. Denies SOB, PND, orthopnea or CP. Taking medications as prescribed. Able to do everything he wants to do with no issues. Following a low salt diet and drinking less than 2L a day. Rare ETOH use.   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 Prior heavy ETOH, now rare.   FH: Mom: HTN, DM       Grandmother: HTN, DM, heart disease   ROS: All systems negative except as listed in HPI, PMH and Problem List.  Past Medical History  Diagnosis Date  . Hypertension   . Athlete's foot   . CHF (congestive heart failure)     a) NICM b) ECHO (05/2013) EF 10%, diff HK, large mural thrombus, mild MR, LA sev. dilated, RA mildly dilated, mod TR c) Swan GC (06/02/13): CVP 19, PA 41/25 (33), PCWP 29, Thermo CO/CI 2.4 / 1.1 d)  cMRI EF 31%, no definite myocardial delayed enhancement   . NICM (nonischemic cardiomyopathy)     a) LHC (06/04/2013): normal coronary arteries    Current Outpatient Prescriptions  Medication Sig Dispense Refill  . aspirin EC 81 MG tablet Take 81 mg by mouth daily.    . carvedilol (COREG) 12.5 MG tablet Take 2 tablets (25 mg total) by mouth 2 (two) times daily with a meal. 90 tablet 6  . citalopram (CELEXA) 10 MG tablet Take 10 mg by mouth every other day.    . furosemide (LASIX) 20 MG tablet Take 20 mg by mouth as needed (For weight gain 3 lbs or mroe overnight).     . Multiple Vitamin (MULTIVITAMIN) tablet Take 1 tablet by mouth daily.    . Sacubitril-Valsartan (ENTRESTO) 49-51 MG TABS Take 1 tablet by mouth 2 (two) times daily. 60 tablet 1  . spironolactone (ALDACTONE) 25 MG tablet Take 1 tablet (25 mg total) by mouth daily. 30 tablet 3   No current facility-administered medications for this encounter.    Filed Vitals:   02/24/14 0853  BP: 104/72  Pulse: 85  Weight: 218 lb 12 oz (99.224 kg)  SpO2: 98%    PHYSICAL EXAM: General:  Well appearing. No resp difficulty; wife present HEENT: normal Neck: supple. JVP flat. Carotids 2+ bilaterally;  no bruits. No lymphadenopathy or thryomegaly appreciated. Cor: PMI normal. Regular rate & rhythm. No rubs, gallops or murmurs. No S3.   Lungs: clear Abdomen: soft, nontender, nondistended. No hepatosplenomegaly. No bruits or masses. Good bowel sounds. Extremities: no cyanosis, clubbing, rash, edema Neuro: alert & orientedx3, cranial nerves grossly intact. Moves all 4 extremities w/o difficulty. Affect pleasant.   ASSESSMENT & PLAN:  1. Chronic Systolic Heart Failure: NICM, echo with EF 25-30% (08/2013),  cMRI 8/15 EF 31%, echo (11/15) with EF 40%.  He is euvolemic on exam with NYHA class I symptoms.   - Increase Coreg to 25 mg bid.   - Continue current Entresto and spironolactone.   - BMET in 1/15.  - With NYHA I symptoms and EF > 35%, he  does not meet criteria for ICD currently. Will continue to follow for LV recovery.  - CPX planned for 1/15 for objective evaluation of functional capacity.  - Reinforced the need and importance of daily weights, a low sodium diet, and fluid restriction (less than 2 L a day). Instructed to call the HF clinic if weight increases more than 3 lbs overnight or 5 lbs in a week. 2. ETOH: Rare ETOH.  3. NSVT: no longer wearing LifeVest. Denies palpitations.  4. LV thrombus: Resolved. Taking ASA 81 daily for now.  5. Mild OSA: Per Dr Troy LollAlva mild sleep apnea. No intervention needed at this time.   Followup in 4 months with echo.    [name] 02/24/2014

## 2014-02-24 NOTE — Patient Instructions (Signed)
INCREASE Coreg to 25 mg twice a day, if you are able to tolerate this dose we will continue. If you begin to have dizziness and increased fatigue we will decrease back to 18.75 twice a day  Please return for b/p check in 2-3 weeks.  Labs needed in February 2016  Your physician recommends that you schedule a follow-up appointment in: 4 months with echo  Do the following things EVERYDAY: 1) Weigh yourself in the morning before breakfast. Write it down and keep it in a log. 2) Take your medicines as prescribed 3) Eat low salt foods-Limit salt (sodium) to 2000 mg per day.  4) Stay as active as you can everyday 5) Limit all fluids for the day to less than 2 liters 6)

## 2014-02-26 ENCOUNTER — Encounter (HOSPITAL_COMMUNITY): Payer: Self-pay | Admitting: Internal Medicine

## 2014-03-11 ENCOUNTER — Encounter: Payer: Self-pay | Admitting: Family Medicine

## 2014-03-11 ENCOUNTER — Ambulatory Visit (INDEPENDENT_AMBULATORY_CARE_PROVIDER_SITE_OTHER): Payer: BC Managed Care – PPO | Admitting: General Practice

## 2014-03-11 ENCOUNTER — Other Ambulatory Visit: Payer: Self-pay | Admitting: General Practice

## 2014-03-11 ENCOUNTER — Telehealth (HOSPITAL_COMMUNITY): Payer: Self-pay | Admitting: *Deleted

## 2014-03-11 ENCOUNTER — Ambulatory Visit (INDEPENDENT_AMBULATORY_CARE_PROVIDER_SITE_OTHER): Payer: BC Managed Care – PPO | Admitting: Family Medicine

## 2014-03-11 VITALS — BP 110/72 | HR 68 | Temp 98.1°F | Resp 18 | Ht 71.0 in | Wt 219.4 lb

## 2014-03-11 DIAGNOSIS — Z0000 Encounter for general adult medical examination without abnormal findings: Secondary | ICD-10-CM | POA: Insufficient documentation

## 2014-03-11 DIAGNOSIS — Z23 Encounter for immunization: Secondary | ICD-10-CM

## 2014-03-11 LAB — CBC WITH DIFFERENTIAL/PLATELET
BASOS ABS: 0 10*3/uL (ref 0.0–0.1)
Basophils Relative: 0.5 % (ref 0.0–3.0)
EOS PCT: 3.5 % (ref 0.0–5.0)
Eosinophils Absolute: 0.3 10*3/uL (ref 0.0–0.7)
HCT: 43.4 % (ref 39.0–52.0)
Hemoglobin: 14.8 g/dL (ref 13.0–17.0)
Lymphocytes Relative: 27.5 % (ref 12.0–46.0)
Lymphs Abs: 2.2 10*3/uL (ref 0.7–4.0)
MCHC: 34.1 g/dL (ref 30.0–36.0)
MCV: 92.7 fl (ref 78.0–100.0)
MONO ABS: 0.5 10*3/uL (ref 0.1–1.0)
Monocytes Relative: 6.3 % (ref 3.0–12.0)
NEUTROS PCT: 62.2 % (ref 43.0–77.0)
Neutro Abs: 4.9 10*3/uL (ref 1.4–7.7)
PLATELETS: 221 10*3/uL (ref 150.0–400.0)
RBC: 4.69 Mil/uL (ref 4.22–5.81)
RDW: 12.6 % (ref 11.5–15.5)
WBC: 7.9 10*3/uL (ref 4.0–10.5)

## 2014-03-11 LAB — LIPID PANEL
CHOL/HDL RATIO: 11
CHOLESTEROL: 226 mg/dL — AB (ref 0–200)
HDL: 20.8 mg/dL — ABNORMAL LOW (ref >39.00)
NonHDL: 205.2
TRIGLYCERIDES: 654 mg/dL — AB (ref 0.0–149.0)
VLDL: 130.8 mg/dL — ABNORMAL HIGH (ref 0.0–40.0)

## 2014-03-11 LAB — HEPATIC FUNCTION PANEL
ALBUMIN: 4.4 g/dL (ref 3.5–5.2)
ALK PHOS: 60 U/L (ref 39–117)
ALT: 28 U/L (ref 0–53)
AST: 21 U/L (ref 0–37)
Bilirubin, Direct: 0 mg/dL (ref 0.0–0.3)
TOTAL PROTEIN: 7.5 g/dL (ref 6.0–8.3)
Total Bilirubin: 0.5 mg/dL (ref 0.2–1.2)

## 2014-03-11 LAB — BASIC METABOLIC PANEL
BUN: 14 mg/dL (ref 6–23)
CHLORIDE: 107 meq/L (ref 96–112)
CO2: 24 meq/L (ref 19–32)
CREATININE: 0.8 mg/dL (ref 0.4–1.5)
Calcium: 9.3 mg/dL (ref 8.4–10.5)
GFR: 116.17 mL/min (ref >60.00)
GLUCOSE: 94 mg/dL (ref 70–99)
Potassium: 4.1 mEq/L (ref 3.5–5.1)
Sodium: 137 mEq/L (ref 135–145)

## 2014-03-11 LAB — LDL CHOLESTEROL, DIRECT: LDL DIRECT: 88.1 mg/dL

## 2014-03-11 LAB — TSH: TSH: 1.93 u[IU]/mL (ref 0.35–4.50)

## 2014-03-11 MED ORDER — FENOFIBRATE 160 MG PO TABS
160.0000 mg | ORAL_TABLET | Freq: Every day | ORAL | Status: DC
Start: 2014-03-11 — End: 2014-09-15

## 2014-03-11 NOTE — Progress Notes (Signed)
Pre visit review using our clinic review tool, if applicable. No additional management support is needed unless otherwise documented below in the visit note. 

## 2014-03-11 NOTE — Progress Notes (Signed)
   Subjective:    Patient ID: [name], male    DOB: 1970/09/27, 43 y.o.   MRN: [id number]  HPI CPE- no concerns.   Review of Systems Patient reports no vision/hearing changes, anorexia, fever ,adenopathy, persistant/recurrent hoarseness, swallowing issues, chest pain, palpitations, edema, persistant/recurrent cough, hemoptysis, dyspnea (rest,exertional, paroxysmal nocturnal), gastrointestinal  bleeding (melena, rectal bleeding), abdominal pain, excessive heart burn, GU symptoms (dysuria, hematuria, voiding/incontinence issues) syncope, focal weakness, memory loss, numbness & tingling, skin/hair/nail changes, depression, anxiety, abnormal bruising/bleeding, musculoskeletal symptoms/signs.     Objective:   Physical Exam General Appearance:    Alert, cooperative, no distress, appears stated age  Head:    Normocephalic, without obvious abnormality, atraumatic  Eyes:    PERRL, conjunctiva/corneas clear, EOM's intact, fundi    benign, both eyes       Ears:    Normal TM's and external ear canals, both ears  Nose:   Nares normal, septum midline, mucosa normal, no drainage   or sinus tenderness  Throat:   Lips, mucosa, and tongue normal; teeth and gums normal  Neck:   Supple, symmetrical, trachea midline, no adenopathy;       thyroid:  No enlargement/tenderness/nodules  Back:     Symmetric, no curvature, ROM normal, no CVA tenderness  Lungs:     Clear to auscultation bilaterally, respirations unlabored  Chest wall:    No tenderness or deformity  Heart:    Regular rate and rhythm, S1 and S2 normal, no murmur, rub   or gallop  Abdomen:     Soft, non-tender, bowel sounds active all four quadrants,    no masses, no organomegaly  Genitalia:    Normal male without lesion, masses,discharge or tenderness  Rectal:    Deferred due to young age  Extremities:   Extremities normal, atraumatic, no cyanosis or edema  Pulses:   2+ and symmetric all extremities  Skin:   Skin color, texture, turgor  normal, no rashes or lesions  Lymph nodes:   Cervical, supraclavicular, and axillary nodes normal  Neurologic:   CNII-XII intact. Normal strength, sensation and reflexes      throughout          Assessment & Plan:

## 2014-03-11 NOTE — Telephone Encounter (Signed)
Completed PA for pt's [name], med was approved 03/11/14-03/19/38, pharmacy and pt aware

## 2014-03-11 NOTE — Patient Instructions (Signed)
Follow up in 6 months to recheck cholesterol and BP We'll notify you of your lab results and make any changes if needed Keep up the good work!  You look great! Call with any questions or concerns Happy Holidays!!!

## 2014-03-15 NOTE — Assessment & Plan Note (Signed)
Pt's PE WNL.  Check labs.  Anticipatory guidance provided.  

## 2014-03-27 ENCOUNTER — Other Ambulatory Visit (HOSPITAL_COMMUNITY): Payer: Self-pay

## 2014-03-27 MED ORDER — SPIRONOLACTONE 25 MG PO TABS: 25.0000 mg | ORAL_TABLET | Freq: Every day | ORAL | Status: DC

## 2014-04-01 ENCOUNTER — Other Ambulatory Visit (HOSPITAL_COMMUNITY): Payer: Self-pay | Admitting: Cardiology

## 2014-04-01 MED ORDER — SPIRONOLACTONE 25 MG PO TABS: 25.0000 mg | ORAL_TABLET | Freq: Every day | ORAL | Status: DC

## 2014-04-10 ENCOUNTER — Encounter (HOSPITAL_COMMUNITY): Payer: BC Managed Care – PPO

## 2014-04-17 ENCOUNTER — Other Ambulatory Visit (HOSPITAL_COMMUNITY): Payer: Self-pay | Admitting: Anesthesiology

## 2014-05-13 ENCOUNTER — Ambulatory Visit (HOSPITAL_COMMUNITY): Payer: BC Managed Care – PPO | Attending: Internal Medicine

## 2014-05-13 DIAGNOSIS — Z7982 Long term (current) use of aspirin: Secondary | ICD-10-CM | POA: Diagnosis not present

## 2014-05-13 DIAGNOSIS — Z79899 Other long term (current) drug therapy: Secondary | ICD-10-CM | POA: Diagnosis not present

## 2014-05-13 DIAGNOSIS — I429 Cardiomyopathy, unspecified: Secondary | ICD-10-CM | POA: Insufficient documentation

## 2014-05-13 DIAGNOSIS — I5022 Chronic systolic (congestive) heart failure: Secondary | ICD-10-CM | POA: Insufficient documentation

## 2014-05-13 DIAGNOSIS — I10 Essential (primary) hypertension: Secondary | ICD-10-CM | POA: Diagnosis not present

## 2014-05-18 ENCOUNTER — Telehealth: Payer: Self-pay | Admitting: Family Medicine

## 2014-05-18 NOTE — Telephone Encounter (Signed)
Hi - Dr Beverely Low put me on cholesterol medication after my Dec 23 visit when my triglyceride level was 654 and also advised me to eat healthy and exercise. As a person recovering from a major heart issue I feel like I do both of these already. My visit was right at Christmas and of course the diet and exercise plan go off the rails some at the holidays. My concern is how did this number get so high between June and December? Is this a number that fluctuates a lot or does it take time to get this high? Could this be diabetes related? We have never discussed a diabetic diagnosis at my appointments. I have really watched my diet since my heart issue in March of last year. I eat healthy, avoid fast food and really limit my sodium. Since the 654 number came into play, I have been cutting out sweets. Other than the sweets, I don't know what else I should eliminate from my diet. Any further advice information related to this number would be appreciated. I know this email seems a little late given the diagnosis was 2 months ago, but I just read the MyChart info this weekend. You can email me back or call me [phone] or [phone].  Thanks

## 2014-05-18 NOTE — Telephone Encounter (Signed)
Spoke with pt who advised that he understood the advice and will look forward to his follow up appt to see where his number are at.

## 2014-05-18 NOTE — Telephone Encounter (Signed)
You ask some good questions.  Yes- triglycerides (the fatty part of the blood) fluctuate widely and rapidly based on diet.  It is not unusual during the holidays to see these go up.  The fact that they jumped so significantly was what prompted me to start medication.  At this time, there is no evidence of diabetes but a low carb diet does improve your numbers.  It is something that we follow every 6 months and I suspect that when you return, your numbers will look much closer to your previous ones.  And yes, I know that you are working very hard on healthy diet and regular exercise since your major scare last year- that's just our token advice for improving triglycerides.  Hope this helps!

## 2014-06-09 ENCOUNTER — Ambulatory Visit: Payer: Self-pay | Admitting: Internal Medicine

## 2014-07-06 ENCOUNTER — Other Ambulatory Visit (HOSPITAL_COMMUNITY): Payer: Self-pay | Admitting: Internal Medicine

## 2014-07-21 ENCOUNTER — Encounter (HOSPITAL_COMMUNITY): Payer: Self-pay | Admitting: Vascular Surgery

## 2014-08-03 ENCOUNTER — Other Ambulatory Visit (HOSPITAL_COMMUNITY): Payer: Self-pay | Admitting: Internal Medicine

## 2014-08-11 ENCOUNTER — Other Ambulatory Visit (HOSPITAL_COMMUNITY): Payer: Self-pay | Admitting: Cardiology

## 2014-08-11 DIAGNOSIS — I5022 Chronic systolic (congestive) heart failure: Secondary | ICD-10-CM

## 2014-08-14 ENCOUNTER — Ambulatory Visit (HOSPITAL_COMMUNITY)
Admission: RE | Admit: 2014-08-14 | Discharge: 2014-08-14 | Disposition: A | Discharge status: Home / Self Care | Payer: BLUE CROSS/BLUE SHIELD | Source: Ambulatory Visit | Attending: Internal Medicine | Admitting: Internal Medicine

## 2014-08-14 ENCOUNTER — Ambulatory Visit (HOSPITAL_BASED_OUTPATIENT_CLINIC_OR_DEPARTMENT_OTHER)
Admission: RE | Admit: 2014-08-14 | Discharge: 2014-08-14 | Disposition: A | Discharge status: Home / Self Care | Payer: BLUE CROSS/BLUE SHIELD | Source: Ambulatory Visit | Attending: Cardiology | Admitting: Cardiology

## 2014-08-14 VITALS — BP 106/68 | HR 71 | Wt 216.8 lb

## 2014-08-14 DIAGNOSIS — I5022 Chronic systolic (congestive) heart failure: Secondary | ICD-10-CM

## 2014-08-14 DIAGNOSIS — I4729 Other ventricular tachycardia: Secondary | ICD-10-CM

## 2014-08-14 DIAGNOSIS — I472 Ventricular tachycardia: Secondary | ICD-10-CM

## 2014-08-14 DIAGNOSIS — I517 Cardiomegaly: Secondary | ICD-10-CM | POA: Insufficient documentation

## 2014-08-14 DIAGNOSIS — I509 Heart failure, unspecified: Secondary | ICD-10-CM | POA: Diagnosis present

## 2014-08-14 LAB — BASIC METABOLIC PANEL
ANION GAP: 7 (ref 5–15)
BUN: 13 mg/dL (ref 6–20)
CHLORIDE: 107 mmol/L (ref 101–111)
CO2: 23 mmol/L (ref 22–32)
Calcium: 9.3 mg/dL (ref 8.9–10.3)
Creatinine, Ser: 0.93 mg/dL (ref 0.61–1.24)
GFR calc Af Amer: >60 mL/min (ref >60)
GFR calc non Af Amer: >60 mL/min (ref >60)
Glucose, Bld: 97 mg/dL (ref 65–99)
Potassium: 4 mmol/L (ref 3.5–5.1)
SODIUM: 137 mmol/L (ref 135–145)

## 2014-08-14 LAB — LIPID PANEL
Cholesterol: 197 mg/dL (ref 0–200)
HDL: 22 mg/dL — AB (ref >40)
LDL CALC: 111 mg/dL — AB (ref 0–99)
Total CHOL/HDL Ratio: 9 RATIO
Triglycerides: 321 mg/dL — ABNORMAL HIGH (ref <150)
VLDL: 64 mg/dL — ABNORMAL HIGH (ref 0–40)

## 2014-08-14 LAB — BRAIN NATRIURETIC PEPTIDE: B Natriuretic Peptide: 20.3 pg/mL (ref 0.0–100.0)

## 2014-08-14 NOTE — Progress Notes (Signed)
Echocardiogram 2D Echocardiogram has been performed.  [name] 08/14/2014, 9:41 AM

## 2014-08-14 NOTE — Progress Notes (Signed)
Patient ID: [name], male   DOB: [birth year], 44 y.o.   MRN: [id number] PCP: Dr Beverely Low Pulmonary: Dr Vassie Loll  HPI: [name] is a 44 year old with a history of ETOH consumption, obesity, and newly diagnosed acute systolic heart failure 05/31/13.   Admitted to [facility] 05/31/13 with dyspnea and lower extremity edema which was from acute systolic heart failure. Echo LVEF 10% with global HK, large LV thrombus and severe RV dysfunction.  LHC 05/2013 normal cors. Required short course of Milrinone. He was discharged with lifevest.  Discharge weight was 191 pounds.   He is doing well today.  No exertional dyspnea, no chest pain, no lightheadedness or syncope.  He is working with no problems. Following a low salt diet and drinking less than 2L a day. Rare ETOH use.   ECHO 06/01/13 EF 10%  ECHO 09/03/13  EF 25-30% RV normal No LV thrombus cMRI 8/15 EF 31% no scar Bedside echo (11/11/13) EF 30-35% ECHO 11/15 EF 40%, diffuse hypokinesis ECHO 5/16 EF 40-45%, mild to moderately dilated LV, normal RV size and systolic function  CPX (2/16) with peak VO2 27.4, VE/VCO2 slope 27.4, RER 1.28 => low normal functional capacity.                                                                                               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 Prior heavy ETOH, now rare.   FH: Mom: HTN, DM       Grandmother: HTN, DM, heart disease   ROS: All systems negative except as listed in HPI, PMH and Problem List.  Past Medical History  Diagnosis Date  . Hypertension   . Athlete's foot   . CHF (congestive heart failure)     a) NICM b) ECHO (05/2013)  EF 10%, diff HK, large mural thrombus, mild MR, LA sev. dilated, RA mildly dilated, mod TR c) Swan GC (06/02/13): CVP 19, PA 41/25 (33), PCWP 29, Thermo CO/CI 2.4 / 1.1 d) cMRI EF 31%, no definite myocardial delayed enhancement   . NICM (nonischemic cardiomyopathy)     a) LHC (06/04/2013): normal coronary arteries    Current Outpatient Prescriptions  Medication Sig Dispense Refill  . aspirin EC 81 MG tablet Take 81 mg by mouth daily.    . carvedilol (COREG) 25 MG tablet Take 1 tablet (25 mg total) by mouth 2 (two) times daily with a meal. 60 tablet 3  . carvedilol (COREG) 25 MG tablet Take 25 mg by mouth 2 (two) times daily with a meal.    . citalopram (CELEXA) 10 MG tablet Take 10 mg by mouth every other day.    Marland Kitchen ENTRESTO 49-51 MG TAKE 1 TABLET BY MOUTH 2 TIMES  DAILY. 60 tablet PRN  . fenofibrate 160 MG tablet Take 1 tablet (160 mg total) by mouth daily. 30 tablet 6  . furosemide (LASIX) 20 MG tablet Take 20 mg by mouth as needed (For weight gain 3 lbs or mroe overnight).     . Multiple Vitamin (MULTIVITAMIN) tablet Take 1 tablet by mouth daily.    Marland Kitchen spironolactone (ALDACTONE) 25 MG tablet TAKE 1 TABLET BY MOUTH ONCE DAILY 30 tablet 3   No current facility-administered medications for this encounter.    Filed Vitals:   08/14/14 0940  BP: 106/68  Pulse: 71  Weight: 216 lb 12.8 oz (98.34 kg)  SpO2: 98%    PHYSICAL EXAM: General:  Well appearing. No resp difficulty; wife present HEENT: normal Neck: supple. JVP flat. Carotids 2+ bilaterally; no bruits. No lymphadenopathy or thryomegaly appreciated. Cor: PMI normal. Regular rate & rhythm. No rubs, gallops or murmurs. No S3.   Lungs: clear Abdomen: soft, nontender, nondistended. No hepatosplenomegaly. No bruits or masses. Good bowel sounds. Extremities: no cyanosis, clubbing, rash, edema Neuro: alert & orientedx3, cranial nerves grossly intact. Moves all 4 extremities w/o difficulty. Affect pleasant.   ASSESSMENT & PLAN:  1.  Chronic Systolic Heart Failure: Nonischemic cardiomyopathy (ETOH may have played a role), echo with EF 10% in 3/15.  With medical management, EF now 40-45% on today's echo (I reviewed) with NYHA class I-II symptoms.  Euvolemic on exam.  Overall, doing well. CPX (2/16) suggested a low normal functional capacity.  - Continue current Coreg, Entresto, and spironolactone.   - BMET/BNP today - With NYHA I symptoms and EF > 35%, he does not meet criteria for ICD currently. Will continue to follow for LV recovery.  - Reinforced the need and importance of daily weights, a low sodium diet, and fluid restriction (less than 2 L a day). Instructed to call the HF clinic if weight increases more than 3 lbs overnight or 5 lbs in a week. 2. ETOH: Rare ETOH now.  3. NSVT: no longer wearing LifeVest. Denies palpitations.  4. LV thrombus: Resolved. Taking ASA 81 daily for now.  5. Mild OSA: Per Dr Vassie Loll mild sleep apnea. No intervention needed at this time.  6. Hyperlipidemia: Very high triglycerides, risk for pancreatitis.  He was started on fenofibrate by his PCP.  I will check lipids today, if triglycerides still quite high, will add fish oil.  Followup in 4 months    [name] 08/14/2014

## 2014-08-14 NOTE — Patient Instructions (Signed)
Labs today  We will contact you in 4 months to schedule your next appointment.  

## 2014-08-20 ENCOUNTER — Telehealth (HOSPITAL_COMMUNITY): Payer: Self-pay | Admitting: Surgery

## 2014-08-20 MED ORDER — FISH OIL 1000 MG PO CAPS: 2000.0000 mg | ORAL_CAPSULE | Freq: Every day | ORAL | Status: DC

## 2014-08-20 NOTE — Telephone Encounter (Signed)
I called [name] to inform him of recent labwork results.  Informed him that per Dr. Shirlee Latch his triglycerides were still elevated and he would like him to start OTC Fish Oil 2 gm daily.  Mr. Fedder is aware and agreeable.

## 2014-08-20 NOTE — Telephone Encounter (Signed)
-----   Message from Laurey Morale, MD sent at 08/14/2014  3:51 PM EDT ----- Triglycerides better but still high.  Start over the counter fish oil, 2 grams daily.  Repeat lipids 2 months.

## 2014-09-10 ENCOUNTER — Ambulatory Visit: Payer: BC Managed Care – PPO | Admitting: Family Medicine

## 2014-09-15 ENCOUNTER — Ambulatory Visit (INDEPENDENT_AMBULATORY_CARE_PROVIDER_SITE_OTHER): Payer: BLUE CROSS/BLUE SHIELD | Admitting: Family Medicine

## 2014-09-15 ENCOUNTER — Encounter: Payer: Self-pay | Admitting: Family Medicine

## 2014-09-15 VITALS — BP 110/72 | HR 60 | Temp 98.2°F | Resp 16 | Ht 71.0 in | Wt 218.0 lb

## 2014-09-15 DIAGNOSIS — E785 Hyperlipidemia, unspecified: Secondary | ICD-10-CM

## 2014-09-15 DIAGNOSIS — I5022 Chronic systolic (congestive) heart failure: Secondary | ICD-10-CM

## 2014-09-15 MED ORDER — FENOFIBRATE 160 MG PO TABS: 160.0000 mg | ORAL_TABLET | Freq: Every day | ORAL | Status: DC

## 2014-09-15 NOTE — Progress Notes (Signed)
   Subjective:    Patient ID: Denys Steitz, male    DOB: 1970-08-09, 44 y.o.   MRN: [id number]  HPI HTN- chronic problem, excellent control.  On Coreg, Spironolactone, Lasix, Entresto.  No CP, SOB, HAs, visual changes, edema.  Hyperlipidemia- chronic problem, on Fenofibrate.  Trigs have improved from 600+ to 321.  No abd pain, N/V.   Review of Systems For ROS see HPI     Objective:   Physical Exam  Constitutional: He is oriented to person, place, and time. He appears well-developed and well-nourished. No distress.  HENT:  Head: Normocephalic and atraumatic.  Eyes: Conjunctivae and EOM are normal. Pupils are equal, round, and reactive to light.  Neck: Normal range of motion. Neck supple. No thyromegaly present.  Cardiovascular: Normal rate, regular rhythm, normal heart sounds and intact distal pulses.   No murmur heard. Pulmonary/Chest: Effort normal and breath sounds normal. No respiratory distress.  Abdominal: Soft. Bowel sounds are normal. He exhibits no distension.  Musculoskeletal: He exhibits no edema.  Lymphadenopathy:    He has no cervical adenopathy.  Neurological: He is alert and oriented to person, place, and time. No cranial nerve deficit.  Skin: Skin is warm and dry.  Psychiatric: He has a normal mood and affect. His behavior is normal.  Vitals reviewed.         Assessment & Plan:

## 2014-09-15 NOTE — Progress Notes (Signed)
Pre visit review using our clinic review tool, if applicable. No additional management support is needed unless otherwise documented below in the visit note. 

## 2014-09-15 NOTE — Patient Instructions (Signed)
Schedule your complete physical in 6 months Your BP looks great!!!  No changes The cholesterol is improving- continue to work on healthy diet and regular exercise Call with any questions or concerns Have a great summer!!

## 2014-09-16 NOTE — Assessment & Plan Note (Signed)
Now a chronic problem for pt.  Following w/ Heart Failure Clinic.  BP is well controlled on HF meds.  Asymptomatic.  No hypotension.  Reviewed recent labs.  No med changes at this time.  Will follow along and assist as able.

## 2014-09-16 NOTE — Assessment & Plan Note (Signed)
Chronic problem.  Pt is on fenofibrate for trigs.  They have decreased by half to 321 but he is still ~200 points above goal.  Stressed need for healthy diet and regular exercise.  No med changes at this time but will follow closely.

## 2014-11-13 ENCOUNTER — Other Ambulatory Visit (HOSPITAL_COMMUNITY): Payer: Self-pay | Admitting: Cardiology

## 2014-11-13 ENCOUNTER — Other Ambulatory Visit (HOSPITAL_COMMUNITY): Payer: Self-pay | Admitting: Internal Medicine

## 2015-01-25 ENCOUNTER — Telehealth: Payer: Self-pay | Admitting: Cardiology

## 2015-01-25 NOTE — Telephone Encounter (Signed)
Spoke with patient's wife and [name] is going to pay 80% just as his insurance would have.  She is okay with that and will let them know how much she can pay  She appreicatated my call and help

## 2015-01-25 NOTE — Telephone Encounter (Signed)
New Message  Pt wife calling to speak w/ Tresa Endo concerning Life Vest. Please callback and discuss.

## 2015-02-08 ENCOUNTER — Other Ambulatory Visit (HOSPITAL_COMMUNITY): Payer: Self-pay | Admitting: Internal Medicine

## 2015-04-06 ENCOUNTER — Ambulatory Visit (INDEPENDENT_AMBULATORY_CARE_PROVIDER_SITE_OTHER): Payer: BLUE CROSS/BLUE SHIELD | Admitting: Family Medicine

## 2015-04-06 ENCOUNTER — Encounter: Payer: Self-pay | Admitting: Family Medicine

## 2015-04-06 VITALS — BP 112/84 | HR 63 | Temp 98.1°F | Ht 71.0 in | Wt 223.4 lb

## 2015-04-06 DIAGNOSIS — Z0000 Encounter for general adult medical examination without abnormal findings: Secondary | ICD-10-CM | POA: Diagnosis not present

## 2015-04-06 DIAGNOSIS — R7989 Other specified abnormal findings of blood chemistry: Secondary | ICD-10-CM

## 2015-04-06 DIAGNOSIS — E785 Hyperlipidemia, unspecified: Secondary | ICD-10-CM

## 2015-04-06 LAB — CBC WITH DIFFERENTIAL/PLATELET
BASOS PCT: 0.4 % (ref 0.0–3.0)
Basophils Absolute: 0 10*3/uL (ref 0.0–0.1)
Eosinophils Absolute: 0.2 10*3/uL (ref 0.0–0.7)
Eosinophils Relative: 2.4 % (ref 0.0–5.0)
HCT: 42.4 % (ref 39.0–52.0)
Hemoglobin: 14.2 g/dL (ref 13.0–17.0)
LYMPHS ABS: 2.7 10*3/uL (ref 0.7–4.0)
LYMPHS PCT: 27.9 % (ref 12.0–46.0)
MCHC: 33.5 g/dL (ref 30.0–36.0)
MCV: 92.1 fl (ref 78.0–100.0)
MONO ABS: 0.6 10*3/uL (ref 0.1–1.0)
MONOS PCT: 5.9 % (ref 3.0–12.0)
NEUTROS ABS: 6 10*3/uL (ref 1.4–7.7)
NEUTROS PCT: 63.4 % (ref 43.0–77.0)
PLATELETS: 281 10*3/uL (ref 150.0–400.0)
RBC: 4.6 Mil/uL (ref 4.22–5.81)
RDW: 12.3 % (ref 11.5–15.5)
WBC: 9.5 10*3/uL (ref 4.0–10.5)

## 2015-04-06 LAB — LIPID PANEL
Cholesterol: 206 mg/dL — ABNORMAL HIGH (ref 0–200)
HDL: 27 mg/dL — AB (ref >39.00)
NONHDL: 179.31
TRIGLYCERIDES: 268 mg/dL — AB (ref 0.0–149.0)
Total CHOL/HDL Ratio: 8
VLDL: 53.6 mg/dL — AB (ref 0.0–40.0)

## 2015-04-06 LAB — HEPATIC FUNCTION PANEL
ALT: 25 U/L (ref 0–53)
AST: 20 U/L (ref 0–37)
Albumin: 4.6 g/dL (ref 3.5–5.2)
Alkaline Phosphatase: 42 U/L (ref 39–117)
BILIRUBIN DIRECT: 0.1 mg/dL (ref 0.0–0.3)
TOTAL PROTEIN: 7.5 g/dL (ref 6.0–8.3)
Total Bilirubin: 0.7 mg/dL (ref 0.2–1.2)

## 2015-04-06 LAB — BASIC METABOLIC PANEL
BUN: 15 mg/dL (ref 6–23)
CALCIUM: 9.3 mg/dL (ref 8.4–10.5)
CO2: 27 mEq/L (ref 19–32)
CREATININE: 0.99 mg/dL (ref 0.40–1.50)
Chloride: 106 mEq/L (ref 96–112)
GFR: 87.77 mL/min (ref >60.00)
GLUCOSE: 97 mg/dL (ref 70–99)
Potassium: 4 mEq/L (ref 3.5–5.1)
SODIUM: 139 meq/L (ref 135–145)

## 2015-04-06 LAB — TSH: TSH: 1.85 u[IU]/mL (ref 0.35–4.50)

## 2015-04-06 LAB — LDL CHOLESTEROL, DIRECT: LDL DIRECT: 121 mg/dL

## 2015-04-06 NOTE — Progress Notes (Signed)
Pre visit review using our clinic review tool, if applicable. No additional management support is needed unless otherwise documented below in the visit note. 

## 2015-04-06 NOTE — Assessment & Plan Note (Signed)
Pt's PE WNL.  Pt declines flu shot today.  Check labs.  Anticipatory guidance provided.

## 2015-04-06 NOTE — Patient Instructions (Signed)
Follow up in 6 months to recheck BP and cholesterol We'll notify you of your lab results and make any changes if needed Try and get regular exercise- you can do it! Call with any questions or concerns If you want to join [place] at the new [address] office, any scheduled appointments will automatically transfer and we will see you at [address] [place] [address], [place], [place] [address] (OPENING [place]) Happy New Year!!!

## 2015-04-06 NOTE — Progress Notes (Signed)
   Subjective:    Patient ID: [name], male    DOB: 1971-01-19, 45 y.o.   MRN: [id number]  HPI CPE- pt declines flu at this time.     Review of Systems Patient reports no vision/hearing changes, anorexia, fever ,adenopathy, persistant/recurrent hoarseness, swallowing issues, chest pain, palpitations, edema, persistant/recurrent cough, hemoptysis, dyspnea (rest,exertional, paroxysmal nocturnal), gastrointestinal  bleeding (melena, rectal bleeding), abdominal pain, excessive heart burn, GU symptoms (dysuria, hematuria, voiding/incontinence issues) syncope, focal weakness, memory loss, numbness & tingling, skin/hair/nail changes, depression, anxiety, abnormal bruising/bleeding, musculoskeletal symptoms/signs.     Objective:   Physical Exam General Appearance:    Alert, cooperative, no distress, appears stated age  Head:    Normocephalic, without obvious abnormality, atraumatic  Eyes:    PERRL, conjunctiva/corneas clear, EOM's intact, fundi    benign, both eyes       Ears:    Normal TM's and external ear canals, both ears  Nose:   Nares normal, septum midline, mucosa normal, no drainage   or sinus tenderness  Throat:   Lips, mucosa, and tongue normal; teeth and gums normal  Neck:   Supple, symmetrical, trachea midline, no adenopathy;       thyroid:  No enlargement/tenderness/nodules  Back:     Symmetric, no curvature, ROM normal, no CVA tenderness  Lungs:     Clear to auscultation bilaterally, respirations unlabored  Chest wall:    No tenderness or deformity  Heart:    Regular rate and rhythm, S1 and S2 normal, no murmur, rub   or gallop  Abdomen:     Soft, non-tender, bowel sounds active all four quadrants,    no masses, no organomegaly  Genitalia:    Normal male without lesion, masses,discharge or tenderness  Rectal:    Deferred due to young age  Extremities:   Extremities normal, atraumatic, no cyanosis or edema  Pulses:   2+ and symmetric all extremities  Skin:   Skin color,  texture, turgor normal, no rashes or lesions  Lymph nodes:   Cervical, supraclavicular, and axillary nodes normal  Neurologic:   CNII-XII intact. Normal strength, sensation and reflexes      throughout          Assessment & Plan:

## 2015-04-07 MED ORDER — ATORVASTATIN CALCIUM 20 MG PO TABS: 20.0000 mg | ORAL_TABLET | Freq: Every day | ORAL | Status: DC

## 2015-04-07 MED FILL — ATORVASTATIN 20 MG TABLET: 20 | 60 days supply | Qty: 60 | Fill #0

## 2015-04-07 NOTE — Addendum Note (Signed)
Addended by: Erin Hearing on: 04/07/2015 03:35 PM   Modules accepted: Orders

## 2015-04-09 ENCOUNTER — Other Ambulatory Visit (HOSPITAL_COMMUNITY): Payer: Self-pay | Admitting: Cardiology

## 2015-04-09 MED FILL — SPIRONOLACTONE 25 MG TABLET: 25 | 30 days supply | Qty: 30 | Fill #2

## 2015-04-09 MED FILL — ENTRESTO 49 MG-51 MG TABLET: 49-51 | 30 days supply | Qty: 60 | Fill #10

## 2015-04-12 MED FILL — CARVEDILOL 25 MG TABLET: 25 | 30 days supply | Qty: 60 | Fill #0

## 2015-05-12 ENCOUNTER — Telehealth: Payer: Self-pay | Admitting: Family Medicine

## 2015-05-12 NOTE — Telephone Encounter (Signed)
Pt chart updated to reflect.  

## 2015-05-12 NOTE — Telephone Encounter (Signed)
Pt declined flu shot OV 04/06/15

## 2015-05-14 MED FILL — SPIRONOLACTONE 25 MG TABLET: 25 | 30 days supply | Qty: 30 | Fill #3

## 2015-05-17 MED FILL — CARVEDILOL 25 MG TABLET: 25 | 30 days supply | Qty: 60 | Fill #1

## 2015-05-23 IMAGING — US US ABDOMEN COMPLETE
1 series · 13 of 25 positions shown · non-contrast
Comparison: None.

CLINICAL DATA: Abdominal pain and vomiting.

EXAM:
ULTRASOUND ABDOMEN COMPLETE

[Series 1: us abdomen complete · 0.26mm/px · 13 of 51 slices shown]
[im 1/51]
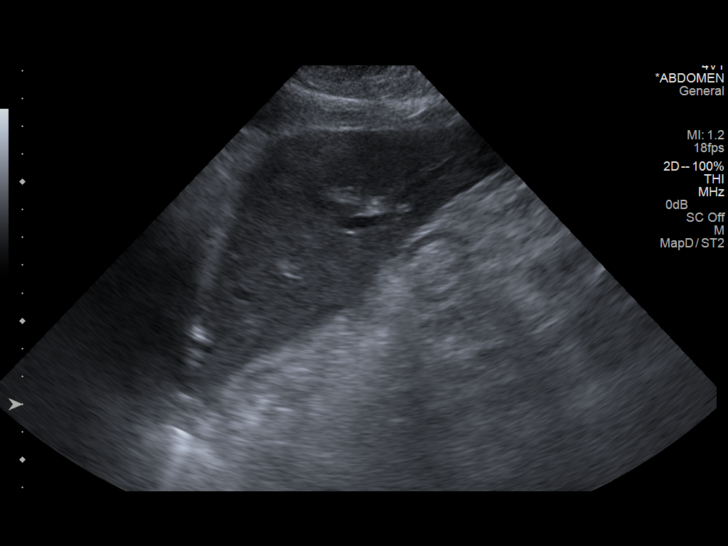
[im 5/51]
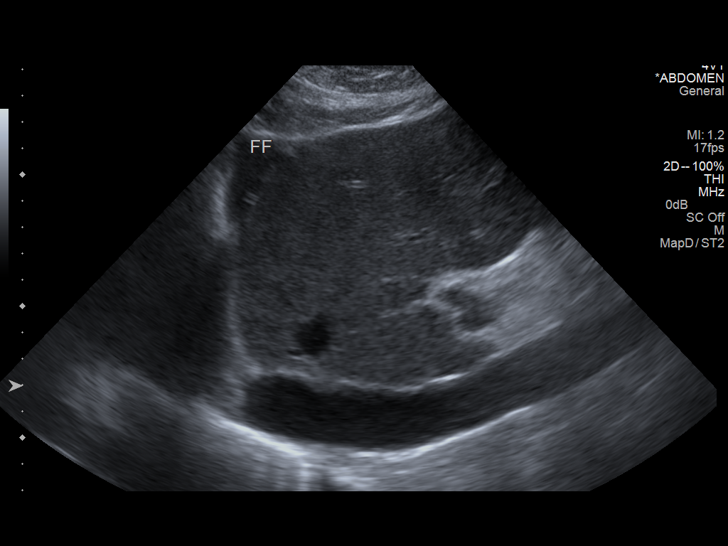
[im 9/51]
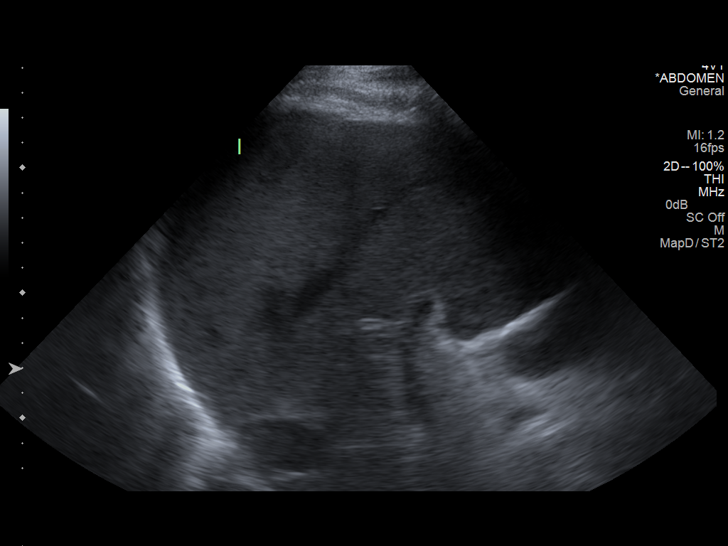
[im 13/51]
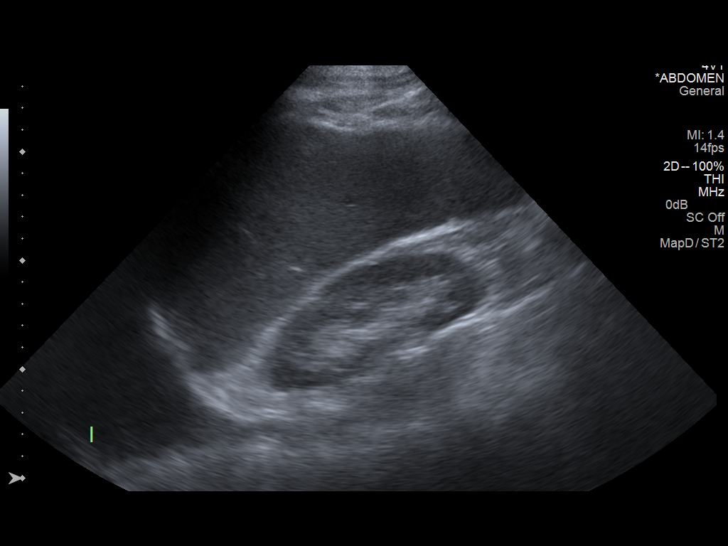
[im 17/51]
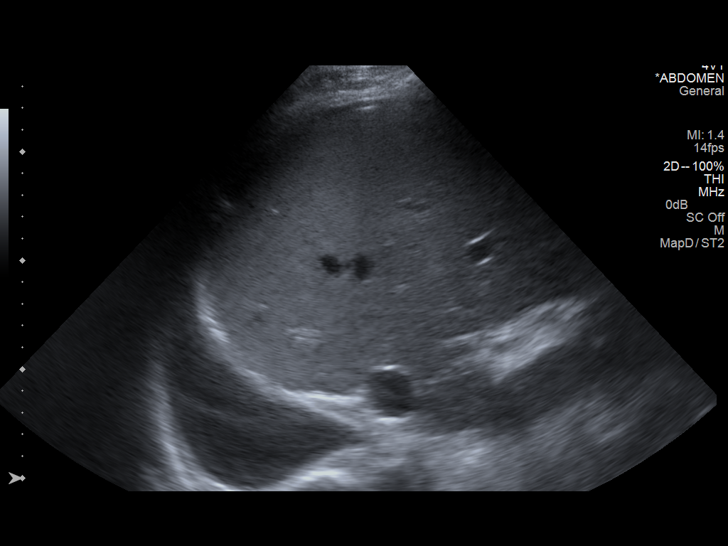
[im 21/51]
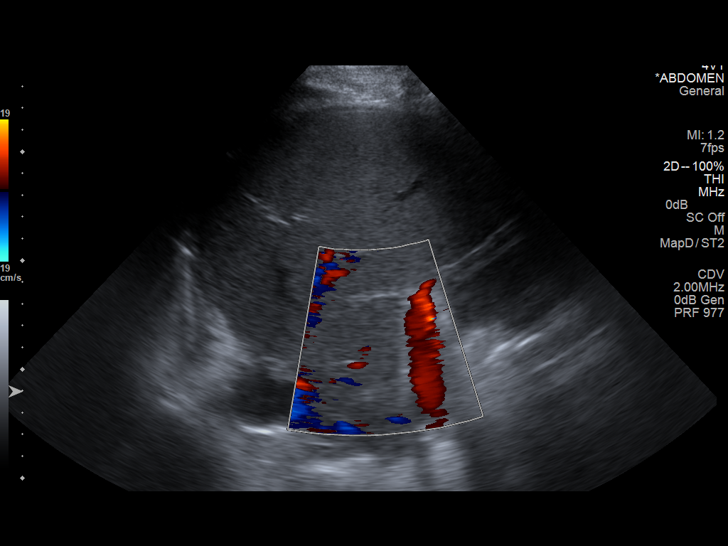
[im 26/51]
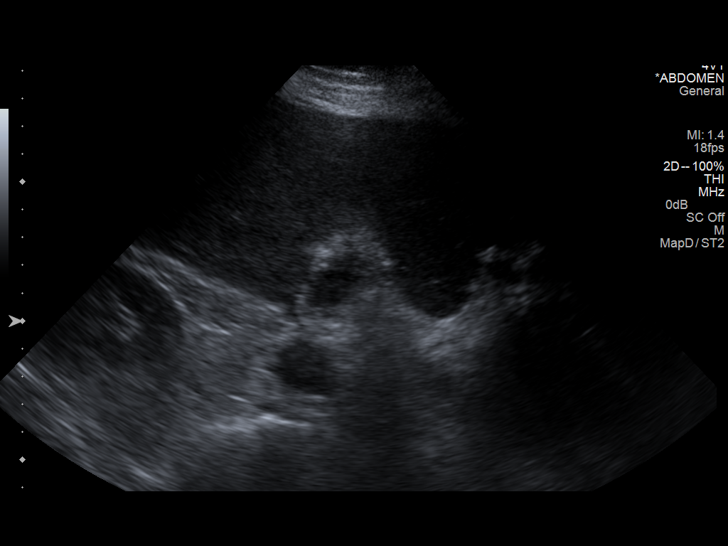
[im 30/51]
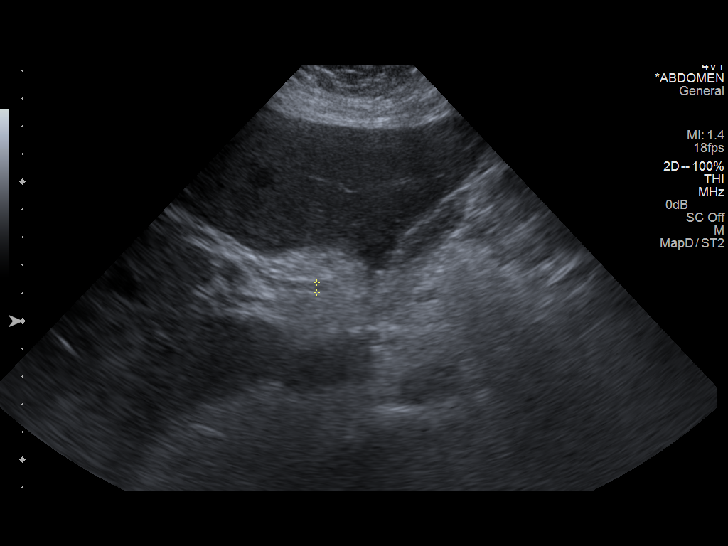
[im 34/51]
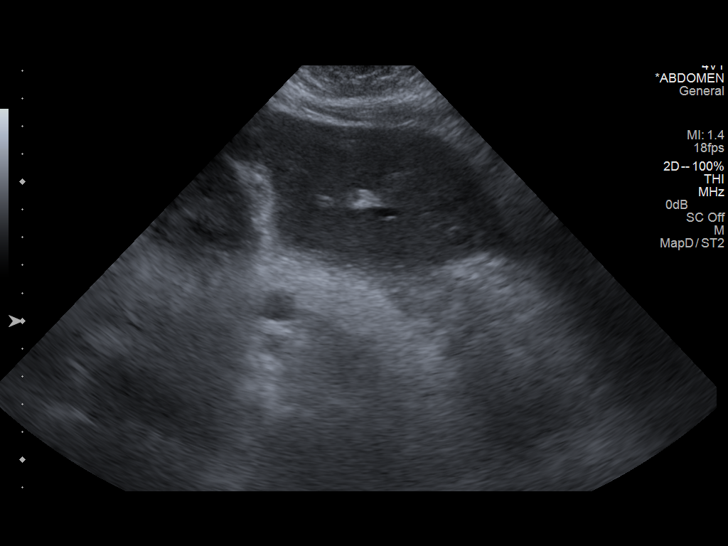
[im 38/51]
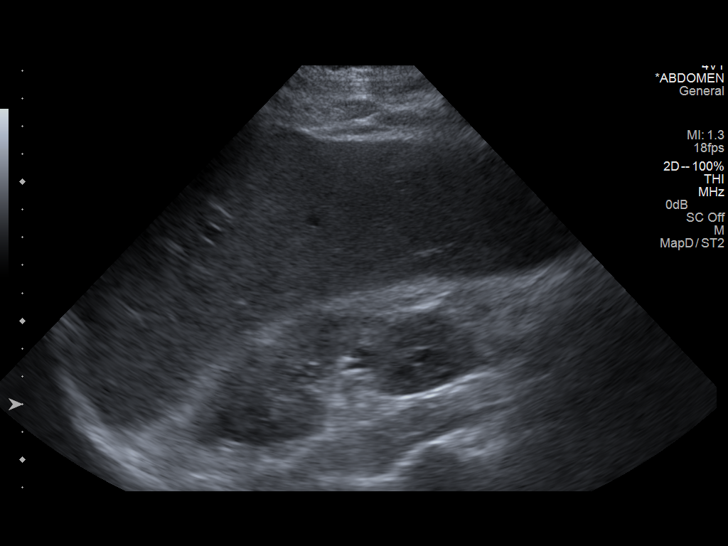
[im 42/51]
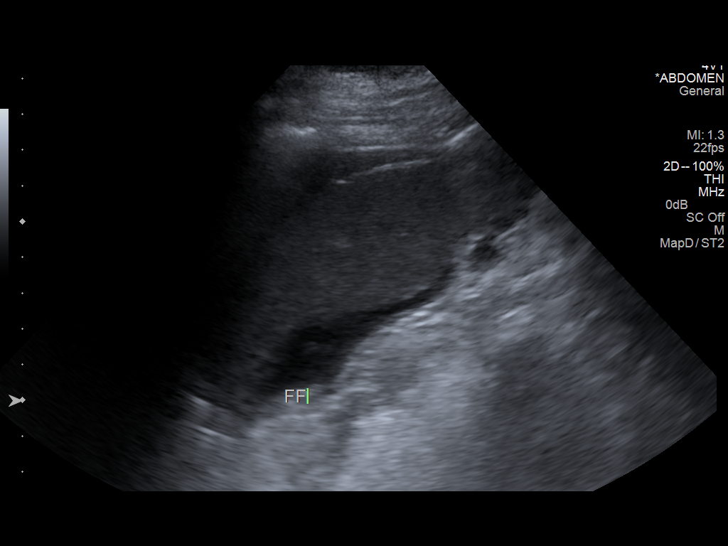
[im 46/51]
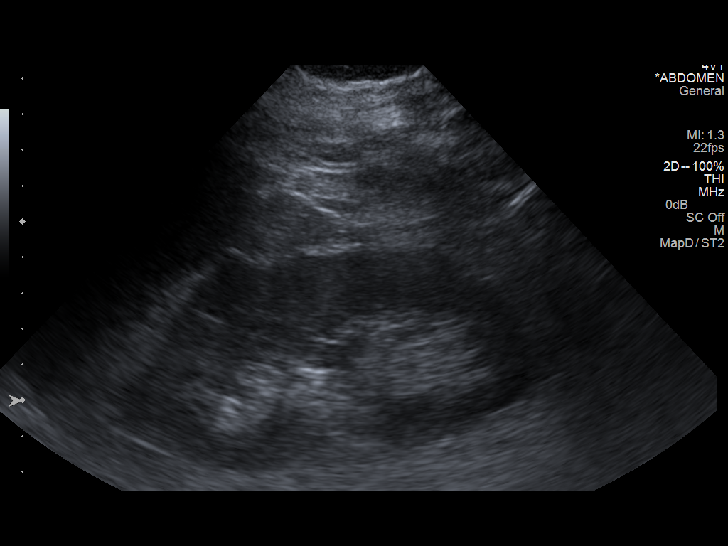
[im 51/51]
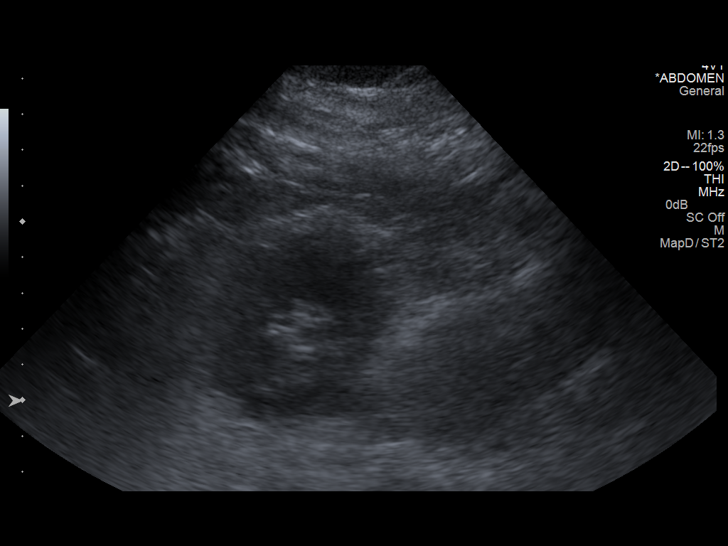

[13 of 25 positions shown; findings below may reference images not displayed]

FINDINGS: Gallbladder:

Gallbladder wall is thickened at 3.8 mm. No gallstones or sludge.
Negative sonographic Murphy's sign.

Common bile duct:

Diameter: 3.6 mm, normal.

Liver:

Hepatomegaly. Small amount of ascites around the liver. No focal
liver lesions. No dilated bile ducts.

IVC:

No abnormality visualized.

Pancreas:

The body of the pancreas is normal. The head and extreme tail of the
pancreas are obscured.

Spleen:

Normal.  10.6 cm in length.

Right Kidney:

Length: 10.4 cm. Echogenicity within normal limits. No mass or
hydronephrosis visualized.

Left Kidney:

Length: 12.5 cm. Echogenicity within normal limits. No mass or
hydronephrosis visualized.

Abdominal aorta:

Normal.  2.1 cm maximum diameter.

Other findings:

Decreased velocity of portal vein flow suggesting portal
hypertension.

There is a  moderate right pleural effusion.
IMPRESSION: 1. Hepatomegaly with the decrease portal vein flow, ascites and
right pleural effusion. I suspect the patient has intrinsic liver
disease.
2. Slight thickening of the gallbladder wall is probably due to low
albumin or protein. I doubt that this represents acute acalculous
cholecystitis.

## 2015-05-24 ENCOUNTER — Other Ambulatory Visit (HOSPITAL_COMMUNITY): Payer: Self-pay | Admitting: Internal Medicine

## 2015-05-24 MED FILL — ENTRESTO 49 MG-51 MG TABLET: 49-51 | 30 days supply | Qty: 60 | Fill #0

## 2015-05-25 IMAGING — CT CT ANGIO CHEST
1 of 3 series · 19 of 32 positions shown · IV contrast (omnipaque)
Comparison: DG CHEST 2 VIEW dated 06/01/2013

CLINICAL DATA: Shortness of breath with exertion, pitting edema.

EXAM:
CT ANGIOGRAPHY CHEST WITH CONTRAST
TECHNIQUE: Multidetector CT imaging of the chest was performed using the
standard protocol during bolus administration of intravenous
contrast. Multiplanar CT image reconstructions and MIPs were
obtained to evaluate the vascular anatomy.
CONTRAST:  100mL OMNIPAQUE IOHEXOL 350 MG/ML SOLN

[Series 5: thins for pacs · axial · 0.79mm/px · z∈[-355,-97]mm · 19 of 284 slices shown]
[im 13/284  lung]
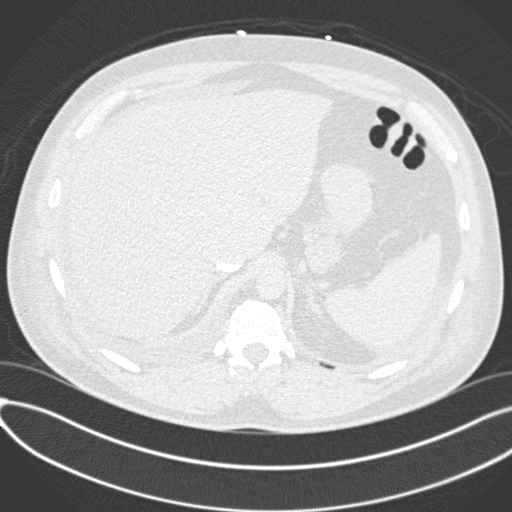
[im 26/284  soft-tissue]
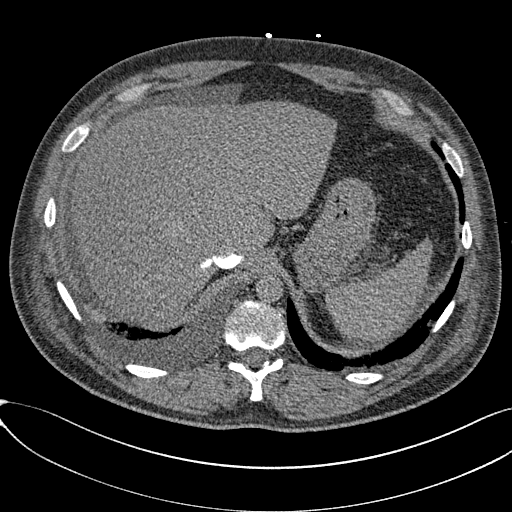
[im 39/284  lung]
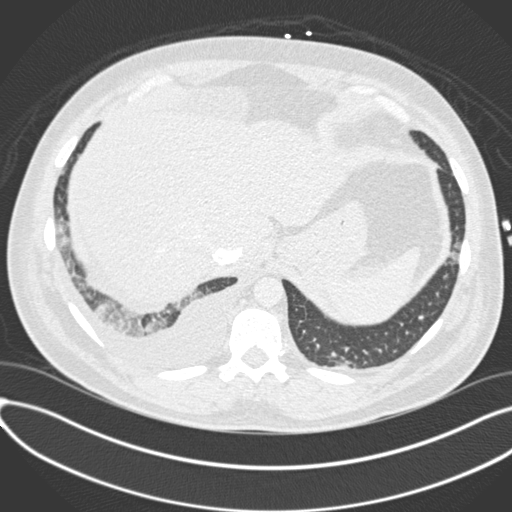
[im 52/284  soft-tissue]
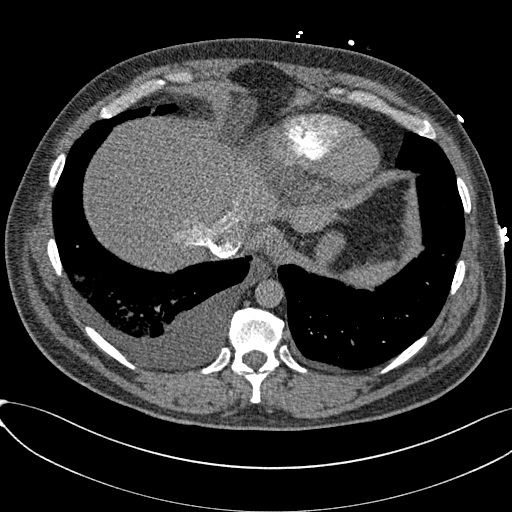
[im 65/284  lung]
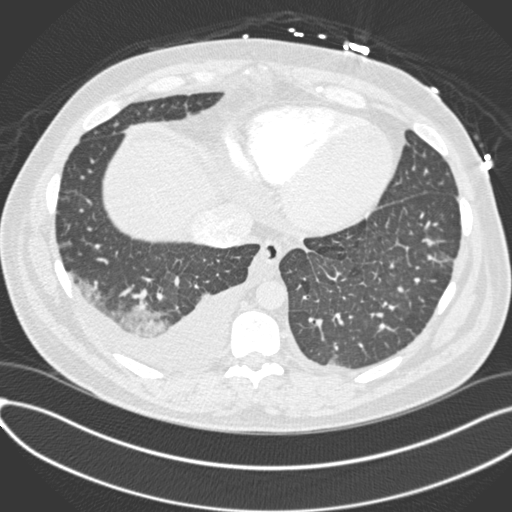
[im 91/284  soft-tissue]
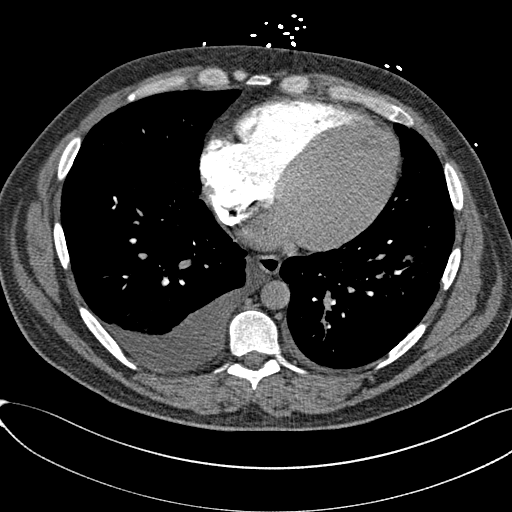
[im 103/284  lung]
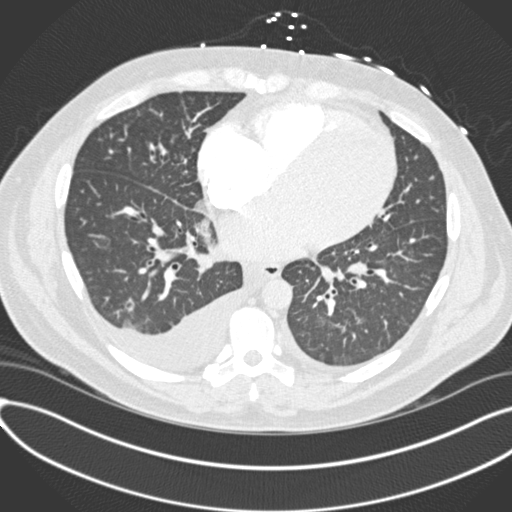
[im 116/284  soft-tissue]
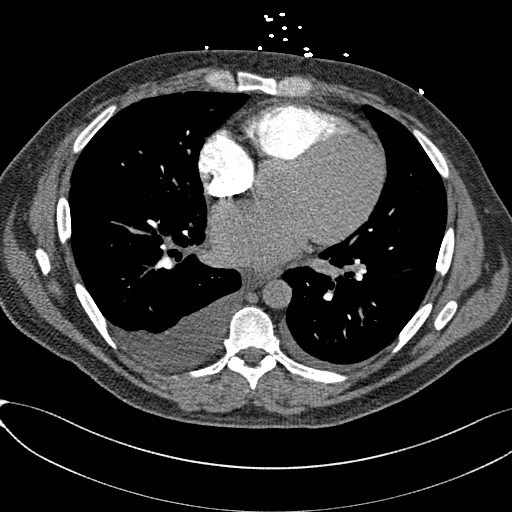
[im 129/284  lung]
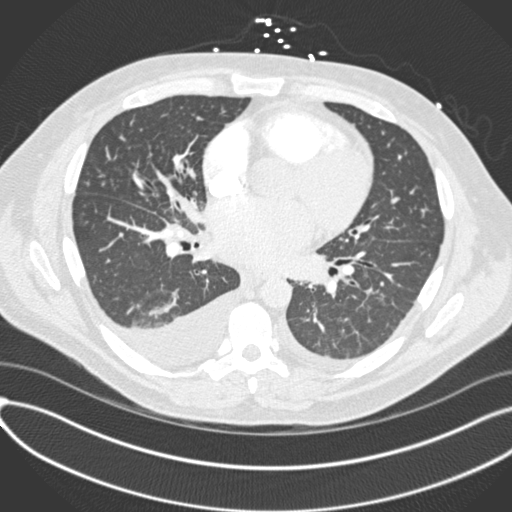
[im 142/284  soft-tissue]
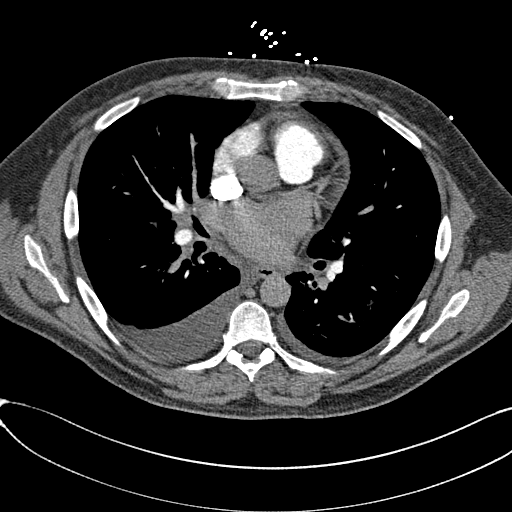
[im 155/284  lung]
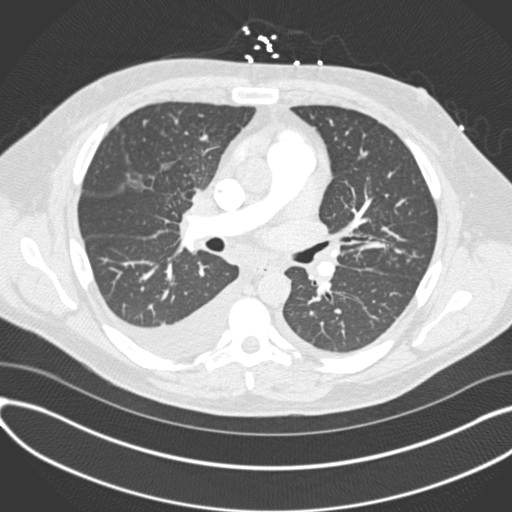
[im 168/284  soft-tissue]
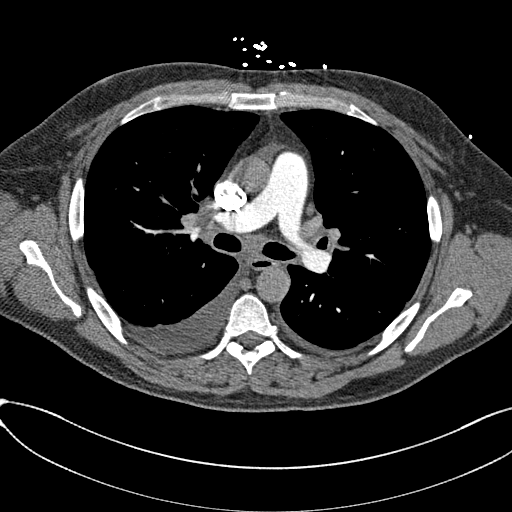
[im 181/284  lung]
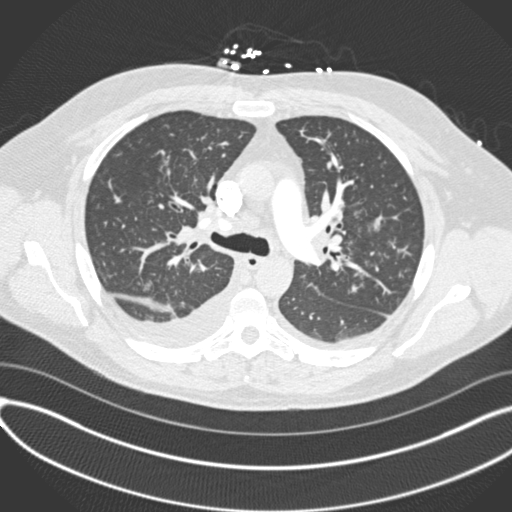
[im 193/284  soft-tissue]
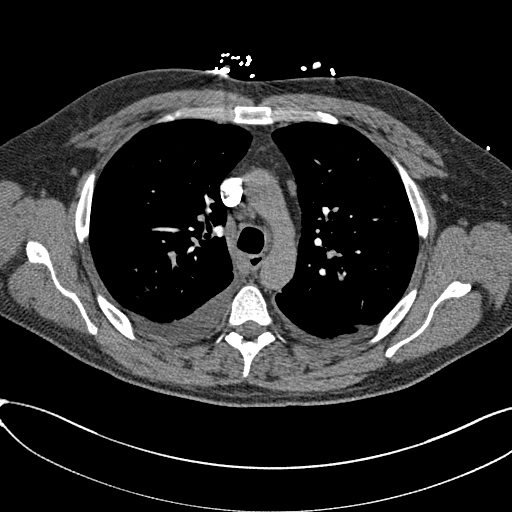
[im 219/284  lung]
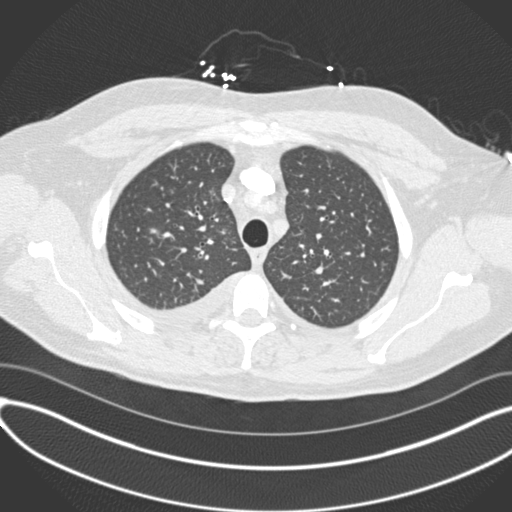
[im 232/284  soft-tissue]
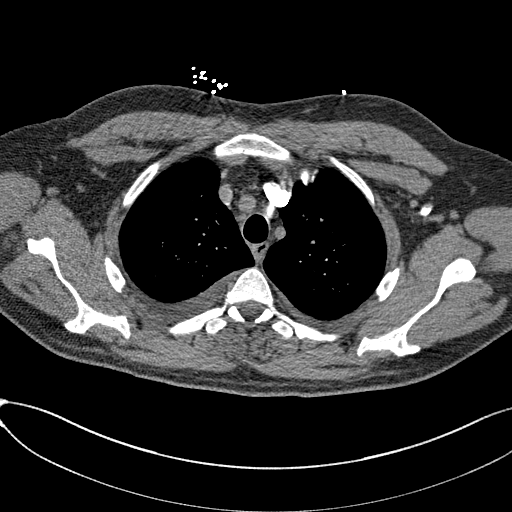
[im 245/284  lung]
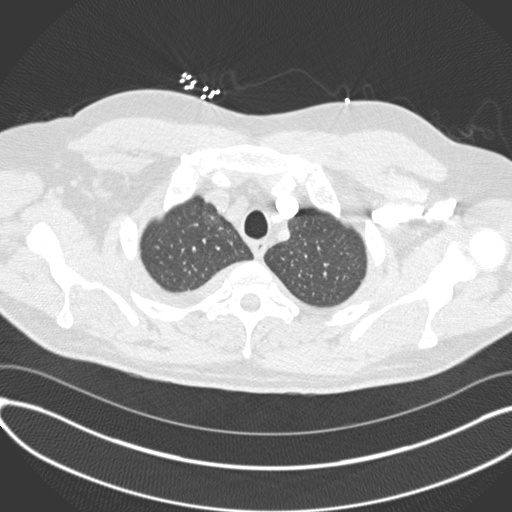
[im 258/284  soft-tissue]
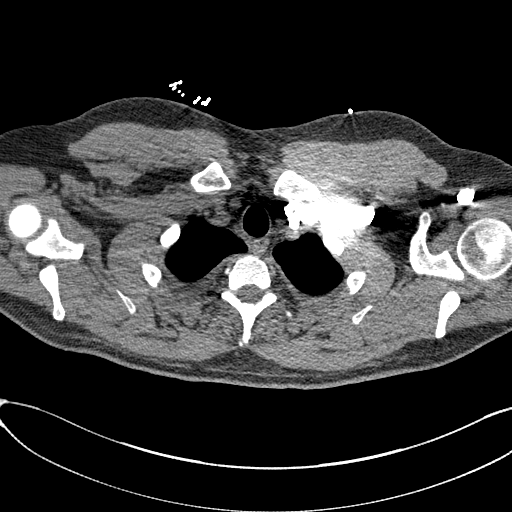
[im 271/284  lung]
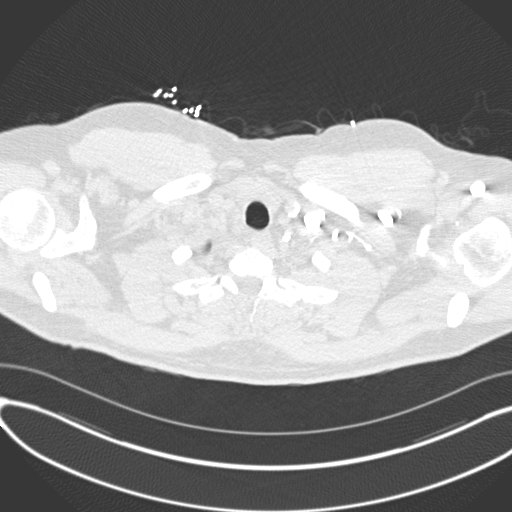

[19 of 32 positions shown; findings below may reference images not displayed]

FINDINGS: Main pulmonary artery is not enlarged. No pulmonary arterial filling
defects to the level of the subsegmental branches.

Heart is mildly enlarged, pericardium is unremarkable. Thoracic
aorta is normal in course and caliber, unremarkable though not
opacified.

Small to moderate right and small left pleural effusion. Patchy
ground-glass opacities, heterogeneous lung parenchyma. Patchy areas
of tree-in-bud infiltrate throughout all lobes of the lungs.
Tracheobronchial tree is patent and midline with mild wall
thickening. Linear density through the lower trachea may reflect
secretions, axial 27 and 95.

Sub cm prevascular/pretracheal lymph nodes, in addition to
precarinal 12 mm short axis lymph node and somewhat prominent right
hilar lymph nodes. Thoracic esophagus is unremarkable.

Mild ascites within the imaged abdomen. Osseous structures are
nonsuspicious. Very mild wedging of vertebral body T12 likely
reflects congenital variant.

Review of the MIP images confirms the above findings.
IMPRESSION: No pulmonary embolism.

Mild cardiomegaly with small to moderate right and small left
pleural effusions. Heterogeneous lung attenuation may reflect small
airway disease/ pulmonary edema. However, there is mild bronchial
wall thickening which can be seen with pulmonary edema or bronchitis
in addition to tree-in-bud airspace opacities throughout the lungs
suggesting infectious or inflammatory process.

Mild ascites within the included abdomen.

  By: Quirijn Amazigh

## 2015-06-16 ENCOUNTER — Other Ambulatory Visit (HOSPITAL_COMMUNITY): Payer: Self-pay | Admitting: *Deleted

## 2015-06-16 MED FILL — CARVEDILOL 25 MG TABLET: 25 | 30 days supply | Qty: 60 | Fill #2

## 2015-06-16 MED FILL — ATORVASTATIN 20 MG TABLET: 20 | 60 days supply | Qty: 60 | Fill #1

## 2015-06-16 MED FILL — ENTRESTO 49 MG-51 MG TABLET: 49-51 | 30 days supply | Qty: 60 | Fill #1

## 2015-06-23 ENCOUNTER — Other Ambulatory Visit (HOSPITAL_COMMUNITY): Payer: Self-pay | Admitting: *Deleted

## 2015-06-23 MED ORDER — FENOFIBRATE 160 MG PO TABS: 160.0000 mg | ORAL_TABLET | Freq: Every day | ORAL | Status: DC

## 2015-06-23 MED ORDER — SPIRONOLACTONE 25 MG PO TABS: 25.0000 mg | ORAL_TABLET | Freq: Every day | ORAL | Status: DC

## 2015-06-23 MED FILL — SPIRONOLACTONE 25 MG TABLET: 25 | 30 days supply | Qty: 30 | Fill #0

## 2015-06-23 MED FILL — FENOFIBRATE 160 MG TABLET: 160 | 90 days supply | Qty: 90 | Fill #0

## 2015-07-01 ENCOUNTER — Ambulatory Visit (INDEPENDENT_AMBULATORY_CARE_PROVIDER_SITE_OTHER): Payer: BLUE CROSS/BLUE SHIELD | Admitting: Medical

## 2015-07-01 ENCOUNTER — Encounter: Payer: Self-pay | Admitting: Medical

## 2015-07-01 VITALS — BP 106/74 | HR 78 | Temp 98.3°F | Ht 71.0 in | Wt 222.0 lb

## 2015-07-01 DIAGNOSIS — J040 Acute laryngitis: Secondary | ICD-10-CM | POA: Diagnosis not present

## 2015-07-01 DIAGNOSIS — J309 Allergic rhinitis, unspecified: Secondary | ICD-10-CM | POA: Diagnosis not present

## 2015-07-01 DIAGNOSIS — J209 Acute bronchitis, unspecified: Secondary | ICD-10-CM

## 2015-07-01 MED ORDER — FLUTICASONE PROPIONATE 50 MCG/ACT NA SUSP: 2.0000 | Freq: Every day | NASAL | Status: DC

## 2015-07-01 MED ORDER — AZITHROMYCIN 250 MG PO TABS: ORAL_TABLET | ORAL | Status: DC

## 2015-07-01 MED ORDER — LEVOCETIRIZINE DIHYDROCHLORIDE 5 MG PO TABS: 5.0000 mg | ORAL_TABLET | Freq: Every evening | ORAL | Status: DC

## 2015-07-01 MED FILL — AZITHROMYCIN 250 MG TABLET: 250 | 5 days supply | Qty: 6 | Fill #0

## 2015-07-01 MED FILL — LEVOCETIRIZINE 5 MG TABLET: 5 | 30 days supply | Qty: 30 | Fill #0

## 2015-07-01 MED FILL — FLUTICASONE PROP 50 MCG SPR: 50 | 30 days supply | Qty: 16 | Fill #0

## 2015-07-01 NOTE — Progress Notes (Signed)
Subjective:    Patient ID: [name], male    DOB: 17-Feb-1971, 45 y.o.   MRN: [id number]  HPI   Pt states 2 weeks ago he mowed his yard. Then after he got off mower hoarse voice. Some nasal congestion. Some producutive cough. No sinus pressure. Faint chest congestion.  Not smoker.   Review of Systems  Constitutional: Negative for fever, chills and fatigue.  HENT: Positive for congestion and voice change.   Respiratory: Positive for cough. Negative for chest tightness, shortness of breath and wheezing.   Cardiovascular: Negative for chest pain and palpitations.  Gastrointestinal: Negative for abdominal pain.  Musculoskeletal: Negative for back pain.  Hematological: Negative for adenopathy. Does not bruise/bleed easily.  Psychiatric/Behavioral: Negative for behavioral problems and confusion.    Past Medical History  Diagnosis Date  . Hypertension   . Athlete's foot   . CHF (congestive heart failure) (HCC)     a) NICM b) ECHO (05/2013) EF 10%, diff HK, large mural thrombus, mild MR, LA sev. dilated, RA mildly dilated, mod TR c) Swan GC (06/02/13): CVP 19, PA 41/25 (33), PCWP 29, Thermo CO/CI 2.4 / 1.1 d) cMRI EF 31%, no definite myocardial delayed enhancement   . NICM (nonischemic cardiomyopathy) (HCC)     a) LHC (06/04/2013): normal coronary arteries    Social History   Social History  . Marital Status: Married    Spouse Name: N/A  . Number of Children: 0  . Years of Education: N/A   Occupational History  . Telephone    Social History Main Topics  . Smoking status: Never Smoker   . Smokeless tobacco: Not on file  . Alcohol Use: No     Comment: No alcohol since 05/2013  . Drug Use: No  . Sexual Activity: Not on file   Other Topics Concern  . Not on file   Social History Narrative   Lives with wife Ansel Ferrall.   Occupation Recruitment consultant    Past Surgical History  Procedure Laterality Date  . Coronary angiogram  06/04/2013    Procedure: CORONARY  ANGIOGRAM;  Surgeon: Dolores Patty, MD;  Location: [facility] CATH LAB;  Service: Cardiovascular;;    Family History  Problem Relation Age of Onset  . Hypertension Mother   . Diabetes Mother   . Hypertension Maternal Grandmother   . Diabetes Maternal Grandmother   . Heart disease Maternal Grandmother   . Vision loss Paternal Grandmother   . Emphysema Paternal Grandfather     No Known Allergies  Current Outpatient Prescriptions on File Prior to Visit  Medication Sig Dispense Refill  . aspirin EC 81 MG tablet Take 81 mg by mouth daily.    Marland Kitchen atorvastatin (LIPITOR) 20 MG tablet Take 1 tablet (20 mg total) by mouth daily. 60 tablet 3  . carvedilol (COREG) 25 MG tablet TAKE 1 TABLET BY MOUTH 2 TIMES DAILY WITH A MEAL. 60 tablet 11  . citalopram (CELEXA) 10 MG tablet Take 10 mg by mouth every other day. Reported on 04/06/2015    . ENTRESTO 49-51 MG TAKE 1 TABLET BY MOUTH 2 TIMES DAILY. 60 tablet PRN  . fenofibrate 160 MG tablet Take 1 tablet (160 mg total) by mouth daily. 90 tablet 1  . furosemide (LASIX) 20 MG tablet Take 20 mg by mouth as needed (For weight gain 3 lbs or mroe overnight). Reported on 04/06/2015    . Multiple Vitamin (MULTIVITAMIN) tablet Take 1 tablet by mouth daily.    . Omega-3  Fatty Acids (FISH OIL) 1000 MG CAPS Take 2 capsules (2,000 mg total) by mouth daily.  0  . spironolactone (ALDACTONE) 25 MG tablet Take 1 tablet (25 mg total) by mouth daily. 30 tablet 3   No current facility-administered medications on file prior to visit.    BP 106/74 mmHg  Pulse 78  Temp(Src) 98.3 F (36.8 C) (Oral)  Ht 5\' 11"  (1.803 m)  Wt 222 lb (100.699 kg)  BMI 30.98 kg/m2  SpO2 98%       Objective:   Physical Exam  General  Mental Status - Alert. General Appearance - Well groomed. Not in acute distress.  Skin Rashes- No Rashes.  HEENT Head- Normal. Ear Auditory Canal - Left- Normal. Right - Normal.Tympanic Membrane- Left- Normal. Right- Normal. Eye Sclera/Conjunctiva-  Left- Normal. Right- Normal. Nose & Sinuses Nasal Mucosa- Left-  Boggy and Congested. Right-  Boggy and  Congested.Bilateral no maxillary and  No frontal sinus pressure. Mouth & Throat Lips: Upper Lip- Normal: no dryness, cracking, pallor, cyanosis, or vesicular eruption. Lower Lip-Normal: no dryness, cracking, pallor, cyanosis or vesicular eruption. Buccal Mucosa- Bilateral- No Aphthous ulcers. Oropharynx- No Discharge or Erythema. +pnd Tonsils: Characteristics- Bilateral- No Erythema or Congestion. Size/Enlargement- Bilateral- No enlargement. Discharge- bilateral-None.  Neck Neck- Supple. No Masses.   Chest and Lung Exam Auscultation: Breath Sounds:-Clear even and unlabored.  Cardiovascular Auscultation:Rythm- Regular, rate and rhythm. Murmurs & Other Heart Sounds:Ausculatation of the heart reveal- No Murmurs.  Lymphatic Head & Neck General Head & Neck Lymphatics: Bilateral: Description- No Localized lymphadenopathy.       Assessment & Plan:  Recent allergies- rx flonase and xyzal.  For hoarse voice. Try to rest your voice. If by 2 wks from now voice still hoarse will refer to ENT.  For early bronchitis rx azithromycin.(pt is not on celexa)  Follow up in 7 days or as needed.

## 2015-07-01 NOTE — Patient Instructions (Addendum)
Recent allergies- rx flonase and xyzal.  For hoarse voice. Try to rest your voice. If by 2 wks from now voice still hoarse will refer to ENT.  For early bronchitis rx azithromycin.(pt is not on celexa)  Follow up in 7 days or as needed.

## 2015-07-01 NOTE — Progress Notes (Signed)
Pre visit review using our clinic review tool, if applicable. No additional management support is needed unless otherwise documented below in the visit note. 

## 2015-08-02 MED FILL — CARVEDILOL 25 MG TABLET: 25 | 30 days supply | Qty: 60 | Fill #3

## 2015-08-02 MED FILL — SPIRONOLACTONE 25 MG TABLET: 25 | 30 days supply | Qty: 30 | Fill #1

## 2015-08-02 MED FILL — ENTRESTO 49 MG-51 MG TABLET: 49-51 | 30 days supply | Qty: 60 | Fill #2

## 2015-09-03 MED FILL — ATORVASTATIN 20 MG TABLET: 20 | 60 days supply | Qty: 60 | Fill #2

## 2015-09-03 MED FILL — SPIRONOLACTONE 25 MG TABLET: 25 | 30 days supply | Qty: 30 | Fill #2

## 2015-09-03 MED FILL — CARVEDILOL 25 MG TABLET: 25 | 30 days supply | Qty: 60 | Fill #4

## 2015-09-03 MED FILL — ENTRESTO 49 MG-51 MG TABLET: 49-51 | 30 days supply | Qty: 60 | Fill #3

## 2015-10-04 ENCOUNTER — Ambulatory Visit (INDEPENDENT_AMBULATORY_CARE_PROVIDER_SITE_OTHER): Payer: BLUE CROSS/BLUE SHIELD | Admitting: Family Medicine

## 2015-10-04 ENCOUNTER — Encounter: Payer: Self-pay | Admitting: Family Medicine

## 2015-10-04 ENCOUNTER — Other Ambulatory Visit (INDEPENDENT_AMBULATORY_CARE_PROVIDER_SITE_OTHER): Payer: BLUE CROSS/BLUE SHIELD

## 2015-10-04 VITALS — BP 112/80 | HR 76 | Temp 98.1°F | Resp 16 | Ht 71.0 in | Wt 222.1 lb

## 2015-10-04 DIAGNOSIS — E785 Hyperlipidemia, unspecified: Secondary | ICD-10-CM | POA: Diagnosis not present

## 2015-10-04 DIAGNOSIS — I5022 Chronic systolic (congestive) heart failure: Secondary | ICD-10-CM

## 2015-10-04 DIAGNOSIS — I10 Essential (primary) hypertension: Secondary | ICD-10-CM | POA: Diagnosis not present

## 2015-10-04 LAB — BASIC METABOLIC PANEL
BUN: 17 mg/dL (ref 6–23)
CALCIUM: 10 mg/dL (ref 8.4–10.5)
CO2: 27 meq/L (ref 19–32)
CREATININE: 1.02 mg/dL (ref 0.40–1.50)
Chloride: 104 mEq/L (ref 96–112)
GFR: 84.6 mL/min (ref >60.00)
GLUCOSE: 90 mg/dL (ref 70–99)
Potassium: 4.3 mEq/L (ref 3.5–5.1)
Sodium: 138 mEq/L (ref 135–145)

## 2015-10-04 LAB — LIPID PANEL
CHOLESTEROL: 142 mg/dL (ref 0–200)
HDL: 22.1 mg/dL — AB (ref >39.00)
NonHDL: 119.65
TRIGLYCERIDES: 227 mg/dL — AB (ref 0.0–149.0)
Total CHOL/HDL Ratio: 6
VLDL: 45.4 mg/dL — ABNORMAL HIGH (ref 0.0–40.0)

## 2015-10-04 LAB — HEPATIC FUNCTION PANEL
ALBUMIN: 4.9 g/dL (ref 3.5–5.2)
ALT: 24 U/L (ref 0–53)
AST: 22 U/L (ref 0–37)
Alkaline Phosphatase: 40 U/L (ref 39–117)
Bilirubin, Direct: 0.1 mg/dL (ref 0.0–0.3)
TOTAL PROTEIN: 7.8 g/dL (ref 6.0–8.3)
Total Bilirubin: 0.8 mg/dL (ref 0.2–1.2)

## 2015-10-04 LAB — TSH: TSH: 2.25 u[IU]/mL (ref 0.35–4.50)

## 2015-10-04 LAB — LDL CHOLESTEROL, DIRECT: LDL DIRECT: 87 mg/dL

## 2015-10-04 MED ORDER — MOMETASONE FUROATE 0.1 % EX SOLN: Freq: Every day | CUTANEOUS | Status: DC

## 2015-10-04 MED FILL — MOMETASONE FUROATE 0.1% SOL: 0.1 | 30 days supply | Qty: 60 | Fill #0

## 2015-10-04 NOTE — Assessment & Plan Note (Signed)
Chronic problem.  Excellent control on current medications.  Asymptomatic.  Check labs.  No anticipated med changes.

## 2015-10-04 NOTE — Assessment & Plan Note (Signed)
Chronic problem.  Currently asymptomatic on medical management.  Check labs.  No anticipated med changes.

## 2015-10-04 NOTE — Patient Instructions (Signed)
Schedule your complete physical in 6 months Go get your labs done at [address] We'll notify you of your lab results and make any changes if needed Continue to work on healthy diet and regular exercise- you can do it! Call with any questions or concerns Have a great time at the beach!!!

## 2015-10-04 NOTE — Progress Notes (Signed)
   Subjective:    Patient ID: [name], male    DOB: [birth year], 45 y.o.   MRN: [id number]  HPI HTN- chronic problem, on Coreg, Lasix, Entresto, Spironolactone w/ excellent control.  Denies CP, SOB, HAs, visual changes, edema.  Hyperlipidemia- chronic problem, on Lipitor and fenofibrate.  No abd pain, N/V, myalgias.  Chronic CHF- ongoing issue, on Entresto, Coreg, Lasix, Spironolactone.  On ASA.  No edema, no CP, SOB.  Review of Systems For ROS see HPI     Objective:   Physical Exam  Constitutional: He is oriented to person, place, and time. He appears well-developed and well-nourished. No distress.  HENT:  Head: Normocephalic and atraumatic.  Eyes: Conjunctivae and EOM are normal. Pupils are equal, round, and reactive to light.  Neck: Normal range of motion. Neck supple. No thyromegaly present.  Cardiovascular: Normal rate, regular rhythm, normal heart sounds and intact distal pulses.   No murmur heard. Pulmonary/Chest: Effort normal and breath sounds normal. No respiratory distress.  Abdominal: Soft. Bowel sounds are normal. He exhibits no distension.  Musculoskeletal: He exhibits no edema.  Lymphadenopathy:    He has no cervical adenopathy.  Neurological: He is alert and oriented to person, place, and time. No cranial nerve deficit.  Skin: Skin is warm and dry.  Psychiatric: He has a normal mood and affect. His behavior is normal.  Vitals reviewed.         Assessment & Plan:

## 2015-10-04 NOTE — Progress Notes (Signed)
Pre visit review using our clinic review tool, if applicable. No additional management support is needed unless otherwise documented below in the visit note. 

## 2015-10-04 NOTE — Assessment & Plan Note (Signed)
Chronic problem.  Tolerating statin and fenofibrate w/o difficulty.  Encouraged healthy diet and regular exercise.  Check labs.  Adjust meds prn

## 2015-10-05 LAB — CBC WITH DIFFERENTIAL/PLATELET
BASOS ABS: 0 10*3/uL (ref 0.0–0.1)
Basophils Relative: 0.2 % (ref 0.0–3.0)
EOS ABS: 0.3 10*3/uL (ref 0.0–0.7)
Eosinophils Relative: 3 % (ref 0.0–5.0)
HEMATOCRIT: 41.2 % (ref 39.0–52.0)
HEMOGLOBIN: 14.1 g/dL (ref 13.0–17.0)
LYMPHS PCT: 30.1 % (ref 12.0–46.0)
Lymphs Abs: 2.8 10*3/uL (ref 0.7–4.0)
MCHC: 34.1 g/dL (ref 30.0–36.0)
MCV: 91.2 fl (ref 78.0–100.0)
MONO ABS: 0.4 10*3/uL (ref 0.1–1.0)
Monocytes Relative: 4.8 % (ref 3.0–12.0)
Neutro Abs: 5.7 10*3/uL (ref 1.4–7.7)
Neutrophils Relative %: 61.9 % (ref 43.0–77.0)
PLATELETS: 255 10*3/uL (ref 150.0–400.0)
RBC: 4.51 Mil/uL (ref 4.22–5.81)
RDW: 12.7 % (ref 11.5–15.5)
WBC: 9.2 10*3/uL (ref 4.0–10.5)

## 2015-10-06 MED FILL — SPIRONOLACTONE 25 MG TABLET: 25 | 30 days supply | Qty: 30 | Fill #3

## 2015-10-06 MED FILL — CARVEDILOL 25 MG TABLET: 25 | 30 days supply | Qty: 60 | Fill #5

## 2015-10-06 MED FILL — FENOFIBRATE 160 MG TABLET: 160 | 90 days supply | Qty: 90 | Fill #1

## 2015-10-06 MED FILL — ENTRESTO 49 MG-51 MG TABLET: 49-51 | 30 days supply | Qty: 60 | Fill #4

## 2015-11-15 ENCOUNTER — Other Ambulatory Visit (HOSPITAL_COMMUNITY): Payer: Self-pay | Admitting: Internal Medicine

## 2015-11-15 MED FILL — SPIRONOLACTONE 25 MG TABLET: 25 | 30 days supply | Qty: 30 | Fill #0

## 2015-11-15 MED FILL — ENTRESTO 49 MG-51 MG TABLET: 49-51 | 30 days supply | Qty: 60 | Fill #5

## 2015-11-15 MED FILL — CARVEDILOL 25 MG TABLET: 25 | 30 days supply | Qty: 60 | Fill #6

## 2015-11-15 MED FILL — ATORVASTATIN 20 MG TABLET: 20 | 60 days supply | Qty: 60 | Fill #3

## 2015-12-27 ENCOUNTER — Other Ambulatory Visit (HOSPITAL_COMMUNITY): Payer: Self-pay | Admitting: Internal Medicine

## 2015-12-27 MED FILL — FENOFIBRATE 160 MG TABLET: 160 | 90 days supply | Qty: 90 | Fill #0

## 2015-12-27 MED FILL — SPIRONOLACTONE 25 MG TABLET: 25 | 30 days supply | Qty: 30 | Fill #1

## 2015-12-27 MED FILL — ENTRESTO 49 MG-51 MG TABLET: 49-51 | 30 days supply | Qty: 60 | Fill #6

## 2015-12-27 MED FILL — CARVEDILOL 25 MG TABLET: 25 | 30 days supply | Qty: 60 | Fill #7

## 2016-01-27 ENCOUNTER — Other Ambulatory Visit: Payer: Self-pay | Admitting: Family Medicine

## 2016-01-27 MED FILL — ENTRESTO 49 MG-51 MG TABLET: 49-51 | 30 days supply | Qty: 60 | Fill #7

## 2016-01-27 MED FILL — CARVEDILOL 25 MG TABLET: 25 | 30 days supply | Qty: 60 | Fill #8

## 2016-01-27 MED FILL — ATORVASTATIN 20 MG TABLET: 20 | 60 days supply | Qty: 60 | Fill #0

## 2016-01-27 MED FILL — SPIRONOLACTONE 25 MG TABLET: 25 | 30 days supply | Qty: 30 | Fill #2

## 2016-02-29 MED FILL — CARVEDILOL 25 MG TABLET: 25 | 30 days supply | Qty: 60 | Fill #9

## 2016-02-29 MED FILL — SPIRONOLACTONE 25 MG TABLET: 25 | 30 days supply | Qty: 30 | Fill #3

## 2016-02-29 MED FILL — ENTRESTO 49 MG-51 MG TABLET: 49-51 | 30 days supply | Qty: 60 | Fill #8

## 2016-03-30 ENCOUNTER — Other Ambulatory Visit (HOSPITAL_COMMUNITY): Payer: Self-pay | Admitting: Internal Medicine

## 2016-03-30 MED FILL — ATORVASTATIN 20 MG TABLET: 20 | 60 days supply | Qty: 60 | Fill #1

## 2016-03-30 MED FILL — SPIRONOLACTONE 25 MG TABLET: 25 | 30 days supply | Qty: 30 | Fill #0

## 2016-03-30 MED FILL — ENTRESTO 49 MG-51 MG TABLET: 49-51 | 30 days supply | Qty: 60 | Fill #9

## 2016-03-30 MED FILL — CARVEDILOL 25 MG TABLET: 25 | 30 days supply | Qty: 60 | Fill #10

## 2016-04-07 ENCOUNTER — Encounter: Payer: BLUE CROSS/BLUE SHIELD | Admitting: Family Medicine

## 2016-04-13 ENCOUNTER — Ambulatory Visit (INDEPENDENT_AMBULATORY_CARE_PROVIDER_SITE_OTHER): Payer: BLUE CROSS/BLUE SHIELD | Admitting: Family Medicine

## 2016-04-13 ENCOUNTER — Encounter: Payer: Self-pay | Admitting: Family Medicine

## 2016-04-13 VITALS — BP 113/83 | HR 72 | Temp 98.2°F | Resp 16 | Ht 71.0 in | Wt 227.4 lb

## 2016-04-13 DIAGNOSIS — Z0000 Encounter for general adult medical examination without abnormal findings: Secondary | ICD-10-CM | POA: Diagnosis not present

## 2016-04-13 DIAGNOSIS — Z23 Encounter for immunization: Secondary | ICD-10-CM

## 2016-04-13 LAB — BASIC METABOLIC PANEL
BUN: 17 mg/dL (ref 7–25)
CALCIUM: 9.7 mg/dL (ref 8.6–10.3)
CO2: 25 mmol/L (ref 20–31)
Chloride: 104 mmol/L (ref 98–110)
Creat: 0.93 mg/dL (ref 0.60–1.35)
GLUCOSE: 88 mg/dL (ref 65–99)
Potassium: 4 mmol/L (ref 3.5–5.3)
SODIUM: 138 mmol/L (ref 135–146)

## 2016-04-13 LAB — CBC WITH DIFFERENTIAL/PLATELET
BASOS ABS: 0 {cells}/uL (ref 0–200)
Basophils Relative: 0 %
EOS ABS: 273 {cells}/uL (ref 15–500)
EOS PCT: 3 %
HCT: 39.3 % (ref 38.5–50.0)
Hemoglobin: 13.5 g/dL (ref 13.2–17.1)
LYMPHS PCT: 36 %
Lymphs Abs: 3276 cells/uL (ref 850–3900)
MCH: 31 pg (ref 27.0–33.0)
MCHC: 34.4 g/dL (ref 32.0–36.0)
MCV: 90.1 fL (ref 80.0–100.0)
MONOS PCT: 9 %
MPV: 9.7 fL (ref 7.5–12.5)
Monocytes Absolute: 819 cells/uL (ref 200–950)
Neutro Abs: 4732 cells/uL (ref 1500–7800)
Neutrophils Relative %: 52 %
PLATELETS: 246 10*3/uL (ref 140–400)
RBC: 4.36 MIL/uL (ref 4.20–5.80)
RDW: 13.1 % (ref 11.0–15.0)
WBC: 9.1 10*3/uL (ref 3.8–10.8)

## 2016-04-13 LAB — LIPID PANEL
CHOLESTEROL: 143 mg/dL (ref <200)
HDL: 20 mg/dL — ABNORMAL LOW (ref >40)
LDL Cholesterol: 83 mg/dL (ref <100)
Total CHOL/HDL Ratio: 7.2 Ratio — ABNORMAL HIGH (ref <5.0)
Triglycerides: 199 mg/dL — ABNORMAL HIGH (ref <150)
VLDL: 40 mg/dL — ABNORMAL HIGH (ref <30)

## 2016-04-13 LAB — HEPATIC FUNCTION PANEL
ALBUMIN: 4.7 g/dL (ref 3.6–5.1)
ALK PHOS: 46 U/L (ref 40–115)
ALT: 26 U/L (ref 9–46)
AST: 22 U/L (ref 10–40)
BILIRUBIN INDIRECT: 0.6 mg/dL (ref 0.2–1.2)
Bilirubin, Direct: 0.1 mg/dL (ref <=0.2)
TOTAL PROTEIN: 7.6 g/dL (ref 6.1–8.1)
Total Bilirubin: 0.7 mg/dL (ref 0.2–1.2)

## 2016-04-13 LAB — TSH: TSH: 2.43 mIU/L (ref 0.40–4.50)

## 2016-04-13 NOTE — Assessment & Plan Note (Signed)
Pt's PE WNL w/ exception of obesity.  Stressed need for healthy diet and regular exercise.  Check labs.  Flu shot given.  Anticipatory guidance provided.  

## 2016-04-13 NOTE — Progress Notes (Signed)
Pre visit review using our clinic review tool, if applicable. No additional management support is needed unless otherwise documented below in the visit note. 

## 2016-04-13 NOTE — Patient Instructions (Signed)
Follow up in 6 months to recheck BP and cholesterol We'll notify you of your lab results and make any changes if needed Continue to work on healthy diet and regular exercise- you can do it! Call with any questions or concerns Happy New Year!!! 

## 2016-04-13 NOTE — Progress Notes (Signed)
   Subjective:    Patient ID: [name], male    DOB: [birth year], 46 y.o.   MRN: [id number]  HPI CPE- UTD on Tdap.  Due for flu shot.   Review of Systems Patient reports no vision/hearing changes, anorexia, fever ,adenopathy, persistant/recurrent hoarseness, swallowing issues, chest pain, palpitations, edema, persistant/recurrent cough, hemoptysis, dyspnea (rest,exertional, paroxysmal nocturnal), gastrointestinal  bleeding (melena, rectal bleeding), abdominal pain, excessive heart burn, GU symptoms (dysuria, hematuria, voiding/incontinence issues) syncope, focal weakness, memory loss, numbness & tingling, skin/hair/nail changes, depression, anxiety, abnormal bruising/bleeding, musculoskeletal symptoms/signs.     Objective:   Physical Exam General Appearance:    Alert, cooperative, no distress, appears stated age  Head:    Normocephalic, without obvious abnormality, atraumatic  Eyes:    PERRL, conjunctiva/corneas clear, EOM's intact, fundi    benign, both eyes       Ears:    Normal TM's and external ear canals, both ears  Nose:   Nares normal, septum midline, mucosa normal, no drainage   or sinus tenderness  Throat:   Lips, mucosa, and tongue normal; teeth and gums normal  Neck:   Supple, symmetrical, trachea midline, no adenopathy;       thyroid:  No enlargement/tenderness/nodules  Back:     Symmetric, no curvature, ROM normal, no CVA tenderness  Lungs:     Clear to auscultation bilaterally, respirations unlabored  Chest wall:    No tenderness or deformity  Heart:    Regular rate and rhythm, S1 and S2 normal, no murmur, rub   or gallop  Abdomen:     Soft, non-tender, bowel sounds active all four quadrants,    no masses, no organomegaly  Genitalia:    Normal male without lesion, masses,discharge or tenderness  Rectal:    Deferred due to young age  Extremities:   Extremities normal, atraumatic, no cyanosis or edema  Pulses:   2+ and symmetric all extremities  Skin:   Skin color,  texture, turgor normal, no rashes or lesions  Lymph nodes:   Cervical, supraclavicular, and axillary nodes normal  Neurologic:   CNII-XII intact. Normal strength, sensation and reflexes      throughout          Assessment & Plan:

## 2016-05-11 ENCOUNTER — Other Ambulatory Visit (HOSPITAL_COMMUNITY): Payer: Self-pay | Admitting: Cardiology

## 2016-05-11 MED ORDER — CARVEDILOL 25 MG PO TABS: ORAL_TABLET | ORAL | 11 refills | Status: DC

## 2016-05-11 MED FILL — ENTRESTO 49 MG-51 MG TABLET: 49-51 | 30 days supply | Qty: 60 | Fill #10

## 2016-05-11 MED FILL — CARVEDILOL 25 MG TABLET: 25 | 30 days supply | Qty: 60 | Fill #0

## 2016-05-11 MED FILL — SPIRONOLACTONE 25 MG TABLET: 25 | 30 days supply | Qty: 30 | Fill #1

## 2016-05-11 MED FILL — FENOFIBRATE 160 MG TABLET: 160 | 90 days supply | Qty: 90 | Fill #1

## 2016-06-14 ENCOUNTER — Other Ambulatory Visit (HOSPITAL_COMMUNITY): Payer: Self-pay | Admitting: Internal Medicine

## 2016-06-14 MED FILL — SPIRONOLACTONE 25 MG TABLET: 25 | 30 days supply | Qty: 30 | Fill #2

## 2016-06-14 MED FILL — ENTRESTO 49 MG-51 MG TABLET: 49-51 | 30 days supply | Qty: 60 | Fill #0

## 2016-06-14 MED FILL — ATORVASTATIN 20 MG TABLET: 20 | 60 days supply | Qty: 60 | Fill #2

## 2016-06-14 MED FILL — FLUTICASONE PROP 50 MCG SPR: 50 | 30 days supply | Qty: 16 | Fill #1

## 2016-06-14 MED FILL — CARVEDILOL 25 MG TABLET: 25 | 30 days supply | Qty: 60 | Fill #1

## 2016-06-14 MED FILL — LEVOCETIRIZINE 5 MG TABLET: 5 | 30 days supply | Qty: 30 | Fill #1

## 2016-08-02 MED FILL — ENTRESTO 49 MG-51 MG TABLET: 49-51 | 30 days supply | Qty: 60 | Fill #1

## 2016-08-02 MED FILL — CARVEDILOL 25 MG TABLET: 25 | 30 days supply | Qty: 60 | Fill #2

## 2016-08-02 MED FILL — SPIRONOLACTONE 25 MG TABLET: 25 | 30 days supply | Qty: 30 | Fill #3

## 2016-09-04 ENCOUNTER — Other Ambulatory Visit (HOSPITAL_COMMUNITY): Payer: Self-pay | Admitting: Internal Medicine

## 2016-09-04 MED FILL — ATORVASTATIN 20 MG TABLET: 20 | 60 days supply | Qty: 60 | Fill #3

## 2016-09-04 MED FILL — CARVEDILOL 25 MG TABLET: 25 | 30 days supply | Qty: 60 | Fill #3

## 2016-09-04 MED FILL — ENTRESTO 49 MG-51 MG TABLET: 49-51 | 30 days supply | Qty: 60 | Fill #2

## 2016-09-04 MED FILL — FENOFIBRATE 160 MG TABLET: 160 | 90 days supply | Qty: 90 | Fill #2

## 2016-09-07 ENCOUNTER — Other Ambulatory Visit (HOSPITAL_COMMUNITY): Payer: Self-pay | Admitting: Internal Medicine

## 2016-09-11 ENCOUNTER — Encounter: Payer: Self-pay | Admitting: Family Medicine

## 2016-09-19 ENCOUNTER — Ambulatory Visit (HOSPITAL_COMMUNITY)
Admission: RE | Admit: 2016-09-19 | Discharge: 2016-09-19 | Disposition: A | Discharge status: Home / Self Care | Payer: BLUE CROSS/BLUE SHIELD | Source: Ambulatory Visit | Attending: Internal Medicine | Admitting: Internal Medicine

## 2016-09-19 ENCOUNTER — Encounter (HOSPITAL_COMMUNITY): Payer: Self-pay

## 2016-09-19 VITALS — BP 126/80 | HR 64 | Wt 221.6 lb

## 2016-09-19 DIAGNOSIS — F1010 Alcohol abuse, uncomplicated: Secondary | ICD-10-CM

## 2016-09-19 DIAGNOSIS — Z8249 Family history of ischemic heart disease and other diseases of the circulatory system: Secondary | ICD-10-CM | POA: Diagnosis not present

## 2016-09-19 DIAGNOSIS — E669 Obesity, unspecified: Secondary | ICD-10-CM | POA: Insufficient documentation

## 2016-09-19 DIAGNOSIS — G4733 Obstructive sleep apnea (adult) (pediatric): Secondary | ICD-10-CM | POA: Insufficient documentation

## 2016-09-19 DIAGNOSIS — Z7982 Long term (current) use of aspirin: Secondary | ICD-10-CM | POA: Insufficient documentation

## 2016-09-19 DIAGNOSIS — I5022 Chronic systolic (congestive) heart failure: Secondary | ICD-10-CM | POA: Diagnosis not present

## 2016-09-19 DIAGNOSIS — M7989 Other specified soft tissue disorders: Secondary | ICD-10-CM | POA: Insufficient documentation

## 2016-09-19 DIAGNOSIS — Z833 Family history of diabetes mellitus: Secondary | ICD-10-CM | POA: Diagnosis not present

## 2016-09-19 DIAGNOSIS — E785 Hyperlipidemia, unspecified: Secondary | ICD-10-CM | POA: Diagnosis not present

## 2016-09-19 DIAGNOSIS — E7849 Other hyperlipidemia: Secondary | ICD-10-CM

## 2016-09-19 DIAGNOSIS — I110 Hypertensive heart disease with heart failure: Secondary | ICD-10-CM | POA: Insufficient documentation

## 2016-09-19 DIAGNOSIS — I429 Cardiomyopathy, unspecified: Secondary | ICD-10-CM | POA: Diagnosis not present

## 2016-09-19 DIAGNOSIS — E784 Other hyperlipidemia: Secondary | ICD-10-CM

## 2016-09-19 MED ORDER — SPIRONOLACTONE 25 MG PO TABS: 25.0000 mg | ORAL_TABLET | Freq: Every day | ORAL | 3 refills | Status: DC

## 2016-09-19 MED FILL — SPIRONOLACTONE 25 MG TABLET: 25 | 30 days supply | Qty: 30 | Fill #0

## 2016-09-19 NOTE — Progress Notes (Signed)
Patient ID: [name], male   DOB: 05-Feb-1971, 46 y.o.   MRN: [id number] PCP: Dr Beverely Low Pulmonary: Dr Vassie Loll  HPI: [name] is a 46 year old with a history of ETOH consumption, obesity, and newly diagnosed acute systolic heart failure 05/31/13.   Admitted to [facility] 05/31/13 with dyspnea and lower extremity edema which was from acute systolic heart failure. Echo LVEF 10% with global HK, large LV thrombus and severe RV dysfunction.  LHC 05/2013 normal cors. Required short course of Milrinone. He was discharged with lifevest.  Discharge weight was 191 pounds.   Today he returns for HF follow up. Overall feeling great. Denies SOB/PND/Orhopnea.Drinking 1-4 beers a week. Taking all medications. Working full time.   ECHO 06/01/13 EF 10%  ECHO 09/03/13  EF 25-30% RV normal No LV thrombus cMRI 8/15 EF 31% no scar Bedside echo (11/11/13) EF 30-35% ECHO 11/15 EF 40%, diffuse hypokinesis ECHO 5/16 EF 40-45%, mild to moderately dilated LV, normal RV size and systolic function  CPX (2/16) with peak VO2 27.4, VE/VCO2 slope 27.4, RER 1.28 => low normal functional capacity.                                                                                                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 Prior heavy ETOH, now rare.   FH: Mom: HTN, DM       Grandmother: HTN, DM, heart disease   ROS: All systems negative except as listed in HPI, PMH and Problem List.  Past Medical History:  Diagnosis Date  . Athlete's foot   . CHF (congestive heart failure) (HCC)    a) NICM b) ECHO (05/2013) EF 10%, diff HK, large mural thrombus, mild MR, LA sev. dilated, RA mildly dilated, mod TR c) Swan GC (06/02/13): CVP 19, PA 41/25 (33), PCWP 29, Thermo CO/CI 2.4 / 1.1 d) cMRI EF 31%, no definite myocardial delayed enhancement   . Hypertension   . NICM (nonischemic cardiomyopathy) (HCC)    a) LHC (06/04/2013): normal coronary arteries    Current Outpatient Prescriptions  Medication Sig  Dispense Refill  . aspirin EC 81 MG tablet Take 81 mg by mouth daily.    Marland Kitchen atorvastatin (LIPITOR) 20 MG tablet TAKE 1 TABLET BY MOUTH ONCE DAILY 60 tablet 6  . carvedilol (COREG) 25 MG tablet TAKE 1 TABLET BY MOUTH 2 TIMES DAILY WITH A MEAL. 60 tablet 11  . ENTRESTO 49-51 MG TAKE 1 TABLET BY MOUTH 2 TIMES DAILY. 60 tablet PRN  . fenofibrate 160 MG tablet TAKE 1 TABLET BY MOUTH DAILY. 90 tablet 3  . fluticasone (FLONASE) 50 MCG/ACT nasal spray Place 2 sprays into both nostrils daily. (Patient not taking: Reported on 04/13/2016) 16 g 1  . mometasone (ELOCON) 0.1 % lotion Apply topically daily. (Patient not taking: Reported on 04/13/2016) 60 mL 1  . Multiple Vitamin (MULTIVITAMIN) tablet Take 1 tablet by mouth daily.    . Omega-3 Fatty Acids (FISH OIL) 1000 MG CAPS Take 2 capsules (2,000 mg total) by mouth daily. (Patient not taking: Reported on 04/13/2016)  0  . spironolactone (ALDACTONE) 25 MG tablet TAKE 1 TABLET BY MOUTH ONCE DAILY 30 tablet 3   No current facility-administered medications for this encounter.     Vitals:   09/19/16 1153  BP: 126/80  Pulse: 64  SpO2: 98%  Weight: 221 lb 9.6 oz (100.5 kg)    PHYSICAL EXAM: General:  Well appearing. No resp difficulty. Walked in the clinic without difficulty.  HEENT: normal Neck: supple. no JVD. Carotids 2+ bilat; no bruits. No lymphadenopathy or thryomegaly appreciated. Cor: PMI nondisplaced. Regular rate & rhythm. No rubs, gallops or murmurs. Lungs: clear Abdomen: soft, nontender, nondistended. No hepatosplenomegaly. No bruits or masses. Good bowel sounds. Extremities: no cyanosis, clubbing, rash, edema Neuro: alert & orientedx3, cranial nerves grossly intact. moves all 4 extremities w/o difficulty. Affect pleasant     ASSESSMENT & PLAN:  1. Chronic Systolic Heart Failure: Nonischemic cardiomyopathy (ETOH may have played a role), echo with EF 10% in 3/15. ECHO 07/2014 40-45%.  NYHA I. Volume status stable. Restart spiro 25 mg  daily. BMET next week.  - Continue current Coreg, Entresto, and spironolactone.   - Reinforced the need and importance of daily weights, a low sodium diet, and fluid restriction (less than 2 L a day). Instructed to call the HF clinic if weight increases more than 3 lbs overnight or 5 lbs in a week. 2. ETOH: Encouraged to stop drinking all together.   3. NSVT: resolved.  4. LV thrombus: Resolved. Continue asa 81 mg daily. .  5. Mild OSA: Per Dr Vassie Loll mild sleep apnea. No intervention needed at this time.  6. Hyperlipidemia: Continue statin. PCP following lipids.   Repeat ECHO.  Follow up in 1 year with Dr Gala Romney   Rosalene Wardrop NP-C  09/19/2016

## 2016-09-19 NOTE — Patient Instructions (Signed)
Labs in 1 week (bmet)  Echocardiogram has been ordered for you, we will schedule at checkout.  Restart your spironolactone 25 mg (1 Tablet) Once Daily.  Follow up in 1 year, we will contact you to schedule appointment.

## 2016-09-26 ENCOUNTER — Ambulatory Visit (HOSPITAL_COMMUNITY)
Admission: RE | Admit: 2016-09-26 | Discharge: 2016-09-26 | Disposition: A | Discharge status: Home / Self Care | Payer: BLUE CROSS/BLUE SHIELD | Source: Ambulatory Visit | Attending: Internal Medicine | Admitting: Internal Medicine

## 2016-09-26 DIAGNOSIS — I5022 Chronic systolic (congestive) heart failure: Secondary | ICD-10-CM | POA: Insufficient documentation

## 2016-09-26 LAB — BASIC METABOLIC PANEL
Anion gap: 7 (ref 5–15)
BUN: 13 mg/dL (ref 6–20)
CHLORIDE: 107 mmol/L (ref 101–111)
CO2: 24 mmol/L (ref 22–32)
CREATININE: 1.02 mg/dL (ref 0.61–1.24)
Calcium: 9.4 mg/dL (ref 8.9–10.3)
GFR calc Af Amer: >60 mL/min (ref >60)
GFR calc non Af Amer: >60 mL/min (ref >60)
Glucose, Bld: 106 mg/dL — ABNORMAL HIGH (ref 65–99)
Potassium: 4.1 mmol/L (ref 3.5–5.1)
SODIUM: 138 mmol/L (ref 135–145)

## 2016-10-03 ENCOUNTER — Ambulatory Visit (HOSPITAL_COMMUNITY)
Admission: RE | Admit: 2016-10-03 | Discharge: 2016-10-03 | Disposition: A | Discharge status: Home / Self Care | Payer: BLUE CROSS/BLUE SHIELD | Source: Ambulatory Visit | Attending: Family Medicine | Admitting: Family Medicine

## 2016-10-03 DIAGNOSIS — I110 Hypertensive heart disease with heart failure: Secondary | ICD-10-CM | POA: Diagnosis not present

## 2016-10-03 DIAGNOSIS — I071 Rheumatic tricuspid insufficiency: Secondary | ICD-10-CM | POA: Diagnosis not present

## 2016-10-03 DIAGNOSIS — I5022 Chronic systolic (congestive) heart failure: Secondary | ICD-10-CM | POA: Diagnosis not present

## 2016-10-03 DIAGNOSIS — E785 Hyperlipidemia, unspecified: Secondary | ICD-10-CM | POA: Insufficient documentation

## 2016-10-03 NOTE — Progress Notes (Signed)
  Echocardiogram 2D Echocardiogram has been performed.  Amesha Bailey T Kelda Azad 10/03/2016, 9:39 AM

## 2016-10-03 NOTE — Progress Notes (Signed)
  Echocardiogram 2D Echocardiogram has been performed.  Brighten Buzzelli T Crawford Tamura 10/03/2016, 9:41 AM

## 2016-10-05 ENCOUNTER — Ambulatory Visit (INDEPENDENT_AMBULATORY_CARE_PROVIDER_SITE_OTHER): Payer: BLUE CROSS/BLUE SHIELD | Admitting: Family Medicine

## 2016-10-05 ENCOUNTER — Encounter: Payer: Self-pay | Admitting: Family Medicine

## 2016-10-05 VITALS — BP 124/80 | HR 79 | Temp 98.0°F | Resp 16 | Ht 71.0 in | Wt 218.0 lb

## 2016-10-05 DIAGNOSIS — E785 Hyperlipidemia, unspecified: Secondary | ICD-10-CM

## 2016-10-05 DIAGNOSIS — I10 Essential (primary) hypertension: Secondary | ICD-10-CM

## 2016-10-05 LAB — CBC WITH DIFFERENTIAL/PLATELET
Basophils Absolute: 0 10*3/uL (ref 0.0–0.1)
Basophils Relative: 0.5 % (ref 0.0–3.0)
EOS PCT: 2.2 % (ref 0.0–5.0)
Eosinophils Absolute: 0.2 10*3/uL (ref 0.0–0.7)
HCT: 40.7 % (ref 39.0–52.0)
Hemoglobin: 14 g/dL (ref 13.0–17.0)
LYMPHS ABS: 2.3 10*3/uL (ref 0.7–4.0)
Lymphocytes Relative: 29.8 % (ref 12.0–46.0)
MCHC: 34.4 g/dL (ref 30.0–36.0)
MCV: 91.7 fl (ref 78.0–100.0)
MONO ABS: 0.5 10*3/uL (ref 0.1–1.0)
Monocytes Relative: 6.2 % (ref 3.0–12.0)
NEUTROS PCT: 61.3 % (ref 43.0–77.0)
Neutro Abs: 4.7 10*3/uL (ref 1.4–7.7)
Platelets: 270 10*3/uL (ref 150.0–400.0)
RBC: 4.44 Mil/uL (ref 4.22–5.81)
RDW: 12.4 % (ref 11.5–15.5)
WBC: 7.7 10*3/uL (ref 4.0–10.5)

## 2016-10-05 LAB — HEPATIC FUNCTION PANEL
ALBUMIN: 4.9 g/dL (ref 3.5–5.2)
ALK PHOS: 37 U/L — AB (ref 39–117)
ALT: 21 U/L (ref 0–53)
AST: 17 U/L (ref 0–37)
Bilirubin, Direct: 0.2 mg/dL (ref 0.0–0.3)
TOTAL PROTEIN: 7.2 g/dL (ref 6.0–8.3)
Total Bilirubin: 0.8 mg/dL (ref 0.2–1.2)

## 2016-10-05 LAB — LIPID PANEL
CHOLESTEROL: 117 mg/dL (ref 0–200)
HDL: 27.9 mg/dL — AB (ref >39.00)
LDL Cholesterol: 67 mg/dL (ref 0–99)
NonHDL: 88.98
TRIGLYCERIDES: 112 mg/dL (ref 0.0–149.0)
Total CHOL/HDL Ratio: 4
VLDL: 22.4 mg/dL (ref 0.0–40.0)

## 2016-10-05 MED FILL — CARVEDILOL 25 MG TABLET: 25 | 30 days supply | Qty: 60 | Fill #4

## 2016-10-05 MED FILL — ENTRESTO 49 MG-51 MG TABLET: 49-51 | 30 days supply | Qty: 60 | Fill #3

## 2016-10-05 NOTE — Assessment & Plan Note (Signed)
Chronic problem.  Tolerating lipitor and fenofibrate w/o difficulty.  Check labs.  Adjust meds prn  

## 2016-10-05 NOTE — Progress Notes (Signed)
   Subjective:    Patient ID: [name], male    DOB: Dec 24, 1970, 46 y.o.   MRN: [id number]  HPI HTN- chronic problem, on Coreg BID, Entresto BID, and Spironolactone daily w/ good control.  No CP, SOB, HAs, visual changes, edema.  Reviewed recent BMP- WNL.  Hyperlipidemia- chronic problem, on Lipitor and Fenofibrate.  Pt has lost 4 lbs since 7/3.  Pt has very active job but no formal exercise.  No abd pain, N/V.   Review of Systems For ROS see HPI     Objective:   Physical Exam  Constitutional: He is oriented to person, place, and time. He appears well-developed and well-nourished. No distress.  HENT:  Head: Normocephalic and atraumatic.  Eyes: Pupils are equal, round, and reactive to light. Conjunctivae and EOM are normal.  Neck: Normal range of motion. Neck supple. No thyromegaly present.  Cardiovascular: Normal rate, regular rhythm, normal heart sounds and intact distal pulses.   No murmur heard. Pulmonary/Chest: Effort normal and breath sounds normal. No respiratory distress.  Abdominal: Soft. Bowel sounds are normal. He exhibits no distension.  Musculoskeletal: He exhibits no edema.  Lymphadenopathy:    He has no cervical adenopathy.  Neurological: He is alert and oriented to person, place, and time. No cranial nerve deficit.  Skin: Skin is warm and dry.  Psychiatric: He has a normal mood and affect. His behavior is normal.  Vitals reviewed.         Assessment & Plan:

## 2016-10-05 NOTE — Progress Notes (Signed)
Pre visit review using our clinic review tool, if applicable. No additional management support is needed unless otherwise documented below in the visit note. 

## 2016-10-05 NOTE — Assessment & Plan Note (Signed)
Chronic problem.  Well controlled today.  Asymptomatic.  Check labs.  No anticipated med changes. 

## 2016-10-05 NOTE — Patient Instructions (Signed)
Schedule your complete physical in 6 months We'll notify you of your lab results and make any changes if needed Continue to work on healthy diet and regular exercise- you look great! Call with any questions or concerns Have a great time at the beach!!!

## 2016-10-06 ENCOUNTER — Encounter: Payer: Self-pay | Admitting: General Practice

## 2016-10-12 ENCOUNTER — Ambulatory Visit: Payer: BLUE CROSS/BLUE SHIELD | Admitting: Family Medicine

## 2016-10-25 MED FILL — SPIRONOLACTONE 25 MG TABLET: 25 | 30 days supply | Qty: 30 | Fill #1

## 2016-10-25 MED FILL — ATORVASTATIN 20 MG TABLET: 20 | 60 days supply | Qty: 60 | Fill #4

## 2016-11-09 ENCOUNTER — Telehealth (HOSPITAL_COMMUNITY): Payer: Self-pay

## 2016-11-09 NOTE — Telephone Encounter (Signed)
ECHOCARDIOGRAM COMPLETE  Order: [id number]  Status:  Final result Visible to patient:  Yes (MyChart) Dx:  Chronic systolic heart failure (HCC)  Notes recorded by Chyrl Civatte, RN on 11/09/2016 at 8:53 AM EDT Patient aware, scheduled for November 2018, advised to call for sooner apt if he develops new s/s. Patient reports "feeling great" at this time. ------  Notes recorded by Tonye Becket D, NP on 10/31/2016 at 12:02 PM EDT Please call EF down a little to 35-40%. Please set up follow up with Dr Leory Plowman in 2 months.

## 2016-11-21 MED FILL — CARVEDILOL 25 MG TABLET: 25 | 30 days supply | Qty: 60 | Fill #5

## 2016-11-21 MED FILL — ENTRESTO 49 MG-51 MG TABLET: 49-51 | 30 days supply | Qty: 60 | Fill #4

## 2016-11-21 MED FILL — SPIRONOLACTONE 25 MG TABLET: 25 | 30 days supply | Qty: 30 | Fill #2

## 2016-11-28 DIAGNOSIS — M25561 Pain in right knee: Secondary | ICD-10-CM | POA: Diagnosis not present

## 2016-12-25 MED FILL — CARVEDILOL 25 MG TABLET: 25 | 30 days supply | Qty: 60 | Fill #6

## 2016-12-25 MED FILL — ENTRESTO 49 MG-51 MG TABLET: 49-51 | 30 days supply | Qty: 60 | Fill #5

## 2016-12-25 MED FILL — ATORVASTATIN 20 MG TABLET: 20 | 60 days supply | Qty: 60 | Fill #5

## 2016-12-25 MED FILL — SPIRONOLACTONE 25 MG TABLET: 25 | 30 days supply | Qty: 30 | Fill #3

## 2017-01-22 ENCOUNTER — Other Ambulatory Visit (HOSPITAL_COMMUNITY): Payer: Self-pay | Admitting: Internal Medicine

## 2017-01-22 ENCOUNTER — Other Ambulatory Visit (HOSPITAL_COMMUNITY): Payer: Self-pay | Admitting: Adult Health

## 2017-01-22 MED FILL — CARVEDILOL 25 MG TABLET: 25 | 30 days supply | Qty: 60 | Fill #7

## 2017-01-22 MED FILL — ENTRESTO 49 MG-51 MG TABLET: 49-51 | 30 days supply | Qty: 60 | Fill #6

## 2017-01-23 MED FILL — FENOFIBRATE 160 MG TABLET: 160 | 90 days supply | Qty: 90 | Fill #0

## 2017-01-23 MED FILL — SPIRONOLACTONE 25 MG TABLET: 25 | 30 days supply | Qty: 30 | Fill #0

## 2017-01-24 ENCOUNTER — Encounter (HOSPITAL_COMMUNITY): Payer: Self-pay | Admitting: Internal Medicine

## 2017-01-24 ENCOUNTER — Ambulatory Visit (HOSPITAL_COMMUNITY)
Admission: RE | Admit: 2017-01-24 | Discharge: 2017-01-24 | Disposition: A | Discharge status: Home / Self Care | Payer: BLUE CROSS/BLUE SHIELD | Source: Ambulatory Visit | Attending: Internal Medicine | Admitting: Internal Medicine

## 2017-01-24 VITALS — BP 122/90 | HR 64 | Wt 224.7 lb

## 2017-01-24 DIAGNOSIS — I5022 Chronic systolic (congestive) heart failure: Secondary | ICD-10-CM

## 2017-01-24 DIAGNOSIS — I429 Cardiomyopathy, unspecified: Secondary | ICD-10-CM | POA: Insufficient documentation

## 2017-01-24 NOTE — Patient Instructions (Signed)
Your physician has requested that you have a cardiac MRI. Cardiac MRI uses a computer to create images of your heart as its beating, producing both still and moving pictures of your heart and major blood vessels. For further information please visit [email or web address]. Please follow the instruction sheet given to you today for more information.  Your physician recommends that you schedule a follow-up appointment in: 3 month with Bensimhon, MD

## 2017-01-24 NOTE — Progress Notes (Signed)
Patient ID: [name], male   DOB: 03-18-1971, 46 y.o.   MRN: [id number] PCP: Dr Beverely Low Pulmonary: Dr Vassie Loll  HPI: [name] is a 46 year old with a history of ETOH consumption, obesity, and newly diagnosed acute systolic heart failure 05/31/13.   Admitted to [facility] 05/31/13 with dyspnea and lower extremity edema which was from acute systolic heart failure. Echo LVEF 10% with global HK, large LV thrombus and severe RV dysfunction.  LHC 05/2013 normal cors. Required short course of Milrinone. He was discharged with lifevest.  Discharge weight was 191 pounds.   Doing great. Able to work FT without limitation. No edema, CP, orthopnea or PND. Taking all meds as prescribed   Echo from 10/03/16 reviewed personally EF 35-40%  ECHO 06/01/13 EF 10%  ECHO 09/03/13  EF 25-30% RV normal No LV thrombus cMRI 8/15 EF 31% no scar Bedside echo (11/11/13) EF 30-35% ECHO 11/15 EF 40%, diffuse hypokinesis ECHO 5/16 EF 40-45%, mild to moderately dilated LV, normal RV size and systolic function  CPX (2/16) with peak VO2 27.4, VE/VCO2 slope 27.4, RER 1.28 => low normal functional capacity.                                                                                                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 Prior heavy ETOH, now rare.   FH: Mom: HTN, DM       Grandmother: HTN, DM, heart disease   ROS: All systems negative except as listed in HPI, PMH and Problem List.  Past Medical History:  Diagnosis Date  . Athlete's foot   . CHF (congestive heart failure) (HCC)    a) NICM b) ECHO (05/2013) EF 10%, diff HK, large mural thrombus, mild MR, LA sev. dilated, RA mildly dilated, mod TR c) Swan GC (06/02/13): CVP 19, PA 41/25 (33), PCWP 29, Thermo CO/CI 2.4 / 1.1 d) cMRI EF 31%, no definite myocardial delayed enhancement   . Hypertension   . NICM (nonischemic cardiomyopathy) (HCC)    a) LHC (06/04/2013): normal coronary arteries    Current Outpatient Medications   Medication Sig Dispense Refill  . aspirin EC 81 MG tablet Take 81 mg by mouth daily.    Marland Kitchen atorvastatin (LIPITOR) 20 MG tablet Take 20 mg by mouth daily.    . carvedilol (COREG) 25 MG tablet TAKE 1 TABLET BY MOUTH 2 TIMES DAILY WITH A MEAL. 60 tablet 11  . ENTRESTO 49-51 MG TAKE 1 TABLET BY MOUTH 2 TIMES DAILY. 60 tablet PRN  . fenofibrate 160 MG tablet TAKE 1 TABLET BY MOUTH DAILY. 90 tablet 3  . fluticasone (FLONASE) 50 MCG/ACT nasal spray Place 2 sprays into both nostrils daily. 16 g 1  . mometasone (ELOCON) 0.1 % lotion Apply topically daily. 60 mL 1  . spironolactone (ALDACTONE) 25 MG tablet TAKE 1 TABLET BY MOUTH DAILY. 30 tablet 3   No current facility-administered medications for this encounter.     Vitals:   01/24/17 0907  BP: 122/90  Pulse: 64  SpO2: 98%  Weight: 224 lb  11.2 oz (101.9 kg)    PHYSICAL EXAM: General:  Well appearing. No resp difficulty HEENT: normal Neck: supple. no JVD. Carotids 2+ bilat; no bruits. No lymphadenopathy or thryomegaly appreciated. Cor: PMI nondisplaced. Regular rate & rhythm. No rubs, gallops or murmurs. Lungs: clear Abdomen: soft, nontender, nondistended. No hepatosplenomegaly. No bruits or masses. Good bowel sounds. Extremities: no cyanosis, clubbing, rash, edema Neuro: alert & orientedx3, cranial nerves grossly intact. moves all 4 extremities w/o difficulty. Affect pleasant   ASSESSMENT & PLAN:  1. Chronic Systolic Heart Failure: Nonischemic cardiomyopathy (ETOH may have played a role), echo with EF 10% in 3/15. ECHO 07/2014 40-45%.  - Echo 7/18 EF 35-40% (reviewed personally). Down slightly from previous - Remains NYHA I. Volume status looks good  - Continue current spiro, carvedilol, Entresto. No room to titrate.  - Will proceed with cMRI to further evaluate etiology of CM 2. ETOH:  - Reminded of need to remain abstinent  3. NSVT:  -Resolved. EF out of range for ICD. 4. LV thrombus:  - Resolved. Not present on echo 7/18.  Continue asa 81 mg daily. .  5. Mild OSA:  -Per Dr Vassie LollAlva mild sleep apnea. No intervention needed at this time.  6. Hyperlipidemia:  -Continue statin. PCP following lipids.   Arvilla MeresBensimhon, Angelyn Osterberg MD 01/24/2017

## 2017-02-23 ENCOUNTER — Other Ambulatory Visit: Payer: Self-pay | Admitting: Family Medicine

## 2017-02-23 MED FILL — CARVEDILOL 25 MG TABLET: 25 | 30 days supply | Qty: 60 | Fill #8

## 2017-02-23 MED FILL — ENTRESTO 49 MG-51 MG TABLET: 49-51 | 30 days supply | Qty: 60 | Fill #7

## 2017-02-23 MED FILL — SPIRONOLACTONE 25 MG TABLET: 25 | 30 days supply | Qty: 30 | Fill #1

## 2017-02-23 MED FILL — ATORVASTATIN 20 MG TABLET: 20 | 60 days supply | Qty: 60 | Fill #0

## 2017-04-05 MED FILL — CARVEDILOL 25 MG TABLET: 25 | 30 days supply | Qty: 60 | Fill #9

## 2017-04-05 MED FILL — ENTRESTO 49 MG-51 MG TABLET: 49-51 | 30 days supply | Qty: 60 | Fill #8

## 2017-04-05 MED FILL — SPIRONOLACTONE 25 MG TABLET: 25 | 30 days supply | Qty: 30 | Fill #2

## 2017-04-17 ENCOUNTER — Other Ambulatory Visit: Payer: Self-pay

## 2017-04-17 ENCOUNTER — Ambulatory Visit (INDEPENDENT_AMBULATORY_CARE_PROVIDER_SITE_OTHER): Payer: BLUE CROSS/BLUE SHIELD | Admitting: Family Medicine

## 2017-04-17 ENCOUNTER — Encounter: Payer: Self-pay | Admitting: Family Medicine

## 2017-04-17 VITALS — BP 126/81 | HR 76 | Temp 98.1°F | Resp 16 | Ht 71.0 in | Wt 227.2 lb

## 2017-04-17 DIAGNOSIS — E785 Hyperlipidemia, unspecified: Secondary | ICD-10-CM | POA: Diagnosis not present

## 2017-04-17 DIAGNOSIS — Z0000 Encounter for general adult medical examination without abnormal findings: Secondary | ICD-10-CM | POA: Diagnosis not present

## 2017-04-17 DIAGNOSIS — Z23 Encounter for immunization: Secondary | ICD-10-CM | POA: Diagnosis not present

## 2017-04-17 DIAGNOSIS — I5022 Chronic systolic (congestive) heart failure: Secondary | ICD-10-CM

## 2017-04-17 LAB — BASIC METABOLIC PANEL
BUN: 13 mg/dL (ref 6–23)
CALCIUM: 9.8 mg/dL (ref 8.4–10.5)
CHLORIDE: 106 meq/L (ref 96–112)
CO2: 28 meq/L (ref 19–32)
Creatinine, Ser: 1 mg/dL (ref 0.40–1.50)
GFR: 85.95 mL/min (ref >60.00)
Glucose, Bld: 96 mg/dL (ref 70–99)
Potassium: 4.3 mEq/L (ref 3.5–5.1)
SODIUM: 140 meq/L (ref 135–145)

## 2017-04-17 LAB — TSH: TSH: 2.18 u[IU]/mL (ref 0.35–4.50)

## 2017-04-17 LAB — CBC WITH DIFFERENTIAL/PLATELET
BASOS PCT: 0.4 % (ref 0.0–3.0)
Basophils Absolute: 0 10*3/uL (ref 0.0–0.1)
EOS ABS: 0.2 10*3/uL (ref 0.0–0.7)
EOS PCT: 2.4 % (ref 0.0–5.0)
HEMATOCRIT: 40.7 % (ref 39.0–52.0)
HEMOGLOBIN: 13.9 g/dL (ref 13.0–17.0)
Lymphocytes Relative: 33.8 % (ref 12.0–46.0)
Lymphs Abs: 2.5 10*3/uL (ref 0.7–4.0)
MCHC: 34.2 g/dL (ref 30.0–36.0)
MCV: 92.1 fl (ref 78.0–100.0)
MONOS PCT: 7.6 % (ref 3.0–12.0)
Monocytes Absolute: 0.6 10*3/uL (ref 0.1–1.0)
NEUTROS ABS: 4.1 10*3/uL (ref 1.4–7.7)
Neutrophils Relative %: 55.8 % (ref 43.0–77.0)
PLATELETS: 264 10*3/uL (ref 150.0–400.0)
RBC: 4.42 Mil/uL (ref 4.22–5.81)
RDW: 12.4 % (ref 11.5–15.5)
WBC: 7.4 10*3/uL (ref 4.0–10.5)

## 2017-04-17 LAB — HEPATIC FUNCTION PANEL
ALT: 30 U/L (ref 0–53)
AST: 22 U/L (ref 0–37)
Albumin: 4.7 g/dL (ref 3.5–5.2)
Alkaline Phosphatase: 36 U/L — ABNORMAL LOW (ref 39–117)
BILIRUBIN DIRECT: 0.1 mg/dL (ref 0.0–0.3)
BILIRUBIN TOTAL: 0.7 mg/dL (ref 0.2–1.2)
Total Protein: 7.1 g/dL (ref 6.0–8.3)

## 2017-04-17 LAB — LIPID PANEL
CHOL/HDL RATIO: 5
Cholesterol: 122 mg/dL (ref 0–200)
HDL: 24 mg/dL — ABNORMAL LOW (ref >39.00)
LDL Cholesterol: 75 mg/dL (ref 0–99)
NONHDL: 98.33
Triglycerides: 117 mg/dL (ref 0.0–149.0)
VLDL: 23.4 mg/dL (ref 0.0–40.0)

## 2017-04-17 NOTE — Assessment & Plan Note (Signed)
Chronic problem.  Asymptomatic.  Following w/ Cards.

## 2017-04-17 NOTE — Assessment & Plan Note (Signed)
Pt's PE WNL w/ exception of obesity.  Flu shot given.  Check labs.  Anticipatory guidance provided.  

## 2017-04-17 NOTE — Addendum Note (Signed)
Addended by: Sheliah Hatch on: 04/17/2017 08:35 AM   Modules accepted: Orders

## 2017-04-17 NOTE — Patient Instructions (Addendum)
Follow up in 6 months to recheck BP and cholesterol We'll notify you of your lab results and make any changes if needed Continue to work on healthy diet and regular exercise- you can do it! Call with any questions or concerns Happy New Year!!! 

## 2017-04-17 NOTE — Assessment & Plan Note (Signed)
Chronic problem.  Tolerating statin w/o difficulty.  Check labs.  Adjust meds prn  

## 2017-04-17 NOTE — Progress Notes (Signed)
   Subjective:    Patient ID: [name], male    DOB: June 02, 1970, 47 y.o.   MRN: [id number]  HPI CPE- UTD on immunizations (flu today). Pt has gained 9 lbs since last visit. No concerns today.  No formal exercise but very active job.   Review of Systems Patient reports no vision/hearing changes, anorexia, fever ,adenopathy, persistant/recurrent hoarseness, swallowing issues, chest pain, palpitations, edema, persistant/recurrent cough, hemoptysis, dyspnea (rest,exertional, paroxysmal nocturnal), gastrointestinal  bleeding (melena, rectal bleeding), abdominal pain, excessive heart burn, GU symptoms (dysuria, hematuria, voiding/incontinence issues) syncope, focal weakness, memory loss, numbness & tingling, skin/hair/nail changes, depression, anxiety, abnormal bruising/bleeding, musculoskeletal symptoms/signs.     Objective:   Physical Exam General Appearance:    Alert, cooperative, no distress, appears stated age  Head:    Normocephalic, without obvious abnormality, atraumatic  Eyes:    PERRL, conjunctiva/corneas clear, EOM's intact, fundi    benign, both eyes       Ears:    Normal TM's and external ear canals, both ears  Nose:   Nares normal, septum midline, mucosa normal, no drainage   or sinus tenderness  Throat:   Lips, mucosa, and tongue normal; teeth and gums normal  Neck:   Supple, symmetrical, trachea midline, no adenopathy;       thyroid:  No enlargement/tenderness/nodules  Back:     Symmetric, no curvature, ROM normal, no CVA tenderness  Lungs:     Clear to auscultation bilaterally, respirations unlabored  Chest wall:    No tenderness or deformity  Heart:    Regular rate and rhythm, S1 and S2 normal, no murmur, rub   or gallop  Abdomen:     Soft, non-tender, bowel sounds active all four quadrants,    no masses, no organomegaly  Genitalia:    Normal male without lesion, masses,discharge or tenderness  Rectal:    Deferred due to young age  Extremities:   Extremities  normal, atraumatic, no cyanosis or edema  Pulses:   2+ and symmetric all extremities  Skin:   Skin color, texture, turgor normal, no rashes or lesions  Lymph nodes:   Cervical, supraclavicular, and axillary nodes normal  Neurologic:   CNII-XII intact. Normal strength, sensation and reflexes      throughout          Assessment & Plan:

## 2017-04-18 ENCOUNTER — Encounter: Payer: Self-pay | Admitting: General Practice

## 2017-05-10 MED FILL — FENOFIBRATE 160 MG TABLET: 160 | 90 days supply | Qty: 90 | Fill #1

## 2017-05-10 MED FILL — ATORVASTATIN 20 MG TABLET: 20 | 60 days supply | Qty: 60 | Fill #1

## 2017-05-10 MED FILL — SPIRONOLACTONE 25 MG TABLET: 25 | 30 days supply | Qty: 30 | Fill #3

## 2017-05-10 MED FILL — ENTRESTO 49 MG-51 MG TABLET: 49-51 | 30 days supply | Qty: 60 | Fill #9

## 2017-05-10 MED FILL — CARVEDILOL 25 MG TABLET: 25 | 30 days supply | Qty: 60 | Fill #10

## 2017-06-14 ENCOUNTER — Other Ambulatory Visit (HOSPITAL_COMMUNITY): Payer: Self-pay | Admitting: Adult Health

## 2017-06-14 ENCOUNTER — Other Ambulatory Visit (HOSPITAL_COMMUNITY): Payer: Self-pay | Admitting: Cardiology

## 2017-06-14 MED FILL — ENTRESTO 49 MG-51 MG TABLET: 49-51 | 30 days supply | Qty: 60 | Fill #10

## 2017-06-15 ENCOUNTER — Other Ambulatory Visit (HOSPITAL_COMMUNITY): Payer: Self-pay | Admitting: Pharmacist

## 2017-06-15 MED ORDER — SPIRONOLACTONE 25 MG PO TABS: 25.0000 mg | ORAL_TABLET | Freq: Every day | ORAL | 5 refills | Status: DC

## 2017-06-15 MED ORDER — CARVEDILOL 25 MG PO TABS: ORAL_TABLET | ORAL | 5 refills | Status: DC

## 2017-06-15 MED FILL — SPIRONOLACTONE 25 MG TABLET: 25 | 30 days supply | Qty: 30 | Fill #0

## 2017-06-15 MED FILL — CARVEDILOL 25 MG TABLET: 25 | 30 days supply | Qty: 60 | Fill #0

## 2017-07-25 ENCOUNTER — Other Ambulatory Visit (HOSPITAL_COMMUNITY): Payer: Self-pay | Admitting: Internal Medicine

## 2017-07-25 MED FILL — SPIRONOLACTONE 25 MG TABLET: 25 | 30 days supply | Qty: 30 | Fill #1

## 2017-07-25 MED FILL — ENTRESTO 49 MG-51 MG TABLET: 49-51 | 30 days supply | Qty: 60 | Fill #0

## 2017-07-25 MED FILL — CARVEDILOL 25 MG TABLET: 25 | 30 days supply | Qty: 60 | Fill #1

## 2017-08-20 MED FILL — ENTRESTO 49 MG-51 MG TABLET: 49-51 | 30 days supply | Qty: 60 | Fill #1

## 2017-08-20 MED FILL — CARVEDILOL 25 MG TABLET: 25 | 30 days supply | Qty: 60 | Fill #2

## 2017-08-20 MED FILL — ATORVASTATIN 20 MG TABLET: 20 | 60 days supply | Qty: 60 | Fill #2

## 2017-08-20 MED FILL — SPIRONOLACTONE 25 MG TABLET: 25 | 30 days supply | Qty: 30 | Fill #2

## 2017-08-20 MED FILL — FENOFIBRATE 160 MG TABLET: 160 | 90 days supply | Qty: 90 | Fill #2

## 2017-10-02 MED FILL — SPIRONOLACTONE 25 MG TABLET: 25 | 30 days supply | Qty: 30 | Fill #3

## 2017-10-02 MED FILL — ENTRESTO 49 MG-51 MG TABLET: 49-51 | 30 days supply | Qty: 60 | Fill #2

## 2017-10-02 MED FILL — CARVEDILOL 25 MG TABLET: 25 | 30 days supply | Qty: 60 | Fill #3

## 2017-10-04 ENCOUNTER — Encounter: Payer: Self-pay | Admitting: General Practice

## 2017-10-04 ENCOUNTER — Ambulatory Visit (INDEPENDENT_AMBULATORY_CARE_PROVIDER_SITE_OTHER): Payer: BLUE CROSS/BLUE SHIELD | Admitting: Family Medicine

## 2017-10-04 ENCOUNTER — Other Ambulatory Visit: Payer: Self-pay

## 2017-10-04 ENCOUNTER — Encounter: Payer: Self-pay | Admitting: Family Medicine

## 2017-10-04 VITALS — BP 124/80 | HR 76 | Temp 98.1°F | Resp 16 | Ht 71.0 in | Wt 224.1 lb

## 2017-10-04 DIAGNOSIS — I10 Essential (primary) hypertension: Secondary | ICD-10-CM | POA: Diagnosis not present

## 2017-10-04 DIAGNOSIS — E785 Hyperlipidemia, unspecified: Secondary | ICD-10-CM

## 2017-10-04 LAB — CBC WITH DIFFERENTIAL/PLATELET
BASOS PCT: 0.5 % (ref 0.0–3.0)
Basophils Absolute: 0 10*3/uL (ref 0.0–0.1)
EOS PCT: 2.7 % (ref 0.0–5.0)
Eosinophils Absolute: 0.2 10*3/uL (ref 0.0–0.7)
HEMATOCRIT: 41 % (ref 39.0–52.0)
Hemoglobin: 14 g/dL (ref 13.0–17.0)
LYMPHS PCT: 29.6 % (ref 12.0–46.0)
Lymphs Abs: 2 10*3/uL (ref 0.7–4.0)
MCHC: 34.2 g/dL (ref 30.0–36.0)
MCV: 91.5 fl (ref 78.0–100.0)
MONOS PCT: 6.8 % (ref 3.0–12.0)
Monocytes Absolute: 0.5 10*3/uL (ref 0.1–1.0)
Neutro Abs: 4 10*3/uL (ref 1.4–7.7)
Neutrophils Relative %: 60.4 % (ref 43.0–77.0)
PLATELETS: 247 10*3/uL (ref 150.0–400.0)
RBC: 4.49 Mil/uL (ref 4.22–5.81)
RDW: 12.7 % (ref 11.5–15.5)
WBC: 6.7 10*3/uL (ref 4.0–10.5)

## 2017-10-04 LAB — HEPATIC FUNCTION PANEL
ALT: 27 U/L (ref 0–53)
AST: 18 U/L (ref 0–37)
Albumin: 4.7 g/dL (ref 3.5–5.2)
Alkaline Phosphatase: 39 U/L (ref 39–117)
BILIRUBIN DIRECT: 0.1 mg/dL (ref 0.0–0.3)
BILIRUBIN TOTAL: 0.6 mg/dL (ref 0.2–1.2)
Total Protein: 7.3 g/dL (ref 6.0–8.3)

## 2017-10-04 LAB — LIPID PANEL
Cholesterol: 134 mg/dL (ref 0–200)
HDL: 26.1 mg/dL — AB (ref >39.00)
NONHDL: 107.75
Total CHOL/HDL Ratio: 5
Triglycerides: 203 mg/dL — ABNORMAL HIGH (ref 0.0–149.0)
VLDL: 40.6 mg/dL — ABNORMAL HIGH (ref 0.0–40.0)

## 2017-10-04 LAB — BASIC METABOLIC PANEL
BUN: 13 mg/dL (ref 6–23)
CALCIUM: 9.7 mg/dL (ref 8.4–10.5)
CO2: 29 meq/L (ref 19–32)
Chloride: 103 mEq/L (ref 96–112)
Creatinine, Ser: 1.03 mg/dL (ref 0.40–1.50)
GFR: 82.89 mL/min (ref >60.00)
Glucose, Bld: 107 mg/dL — ABNORMAL HIGH (ref 70–99)
Potassium: 4.4 mEq/L (ref 3.5–5.1)
SODIUM: 138 meq/L (ref 135–145)

## 2017-10-04 LAB — LDL CHOLESTEROL, DIRECT: Direct LDL: 78 mg/dL

## 2017-10-04 LAB — TSH: TSH: 2.51 u[IU]/mL (ref 0.35–4.50)

## 2017-10-04 NOTE — Assessment & Plan Note (Signed)
Chronic problem.  Well controlled today.  Asymptomatic.  Check labs.  No anticipated med changes.  Will follow. 

## 2017-10-04 NOTE — Progress Notes (Signed)
   Subjective:    Patient ID: [name], male    DOB: 19-Mar-1971, 47 y.o.   MRN: [id number]  HPI Hyperlipidemia- chronic problem, on Lipitor 20mg  daily.  Pt is down 3 lbs.  Denies abd pain, N/V, myalgias  HTN- chronic problem, on Coreg 25mg  BID, Entresto 49/51mg  BID, and on Spironolactone 25mg  daily w/ good control.  No CP, SOB, HAs, visual changes, edema.  + fatigue due to Coreg   Review of Systems For ROS see HPI     Objective:   Physical Exam  Constitutional: He is oriented to person, place, and time. He appears well-developed and well-nourished. No distress.  obese  HENT:  Head: Normocephalic and atraumatic.  Eyes: Pupils are equal, round, and reactive to light. Conjunctivae and EOM are normal.  Neck: Normal range of motion. Neck supple. No thyromegaly present.  Cardiovascular: Normal rate, regular rhythm, normal heart sounds and intact distal pulses.  No murmur heard. Pulmonary/Chest: Effort normal and breath sounds normal. No respiratory distress.  Abdominal: Soft. Bowel sounds are normal. He exhibits no distension.  Musculoskeletal: He exhibits no edema.  Lymphadenopathy:    He has no cervical adenopathy.  Neurological: He is alert and oriented to person, place, and time. No cranial nerve deficit.  Skin: Skin is warm and dry.  Psychiatric: He has a normal mood and affect. His behavior is normal.  Vitals reviewed.         Assessment & Plan:

## 2017-10-04 NOTE — Patient Instructions (Signed)
Schedule your complete physical in 6 months We'll notify you of your lab results and make any changes if needed Keep up the good work on healthy diet and regular exercise- you can do it!! Call with any questions or concerns Have a great summer!!! 

## 2017-10-04 NOTE — Assessment & Plan Note (Signed)
Chronic problem.  Tolerating statin w/o difficulty.  Check labs.  Adjust meds prn  

## 2017-11-07 MED FILL — CARVEDILOL 25 MG TABLET: 25 | 30 days supply | Qty: 60 | Fill #4

## 2017-11-07 MED FILL — FENOFIBRATE 160 MG TABLET: 160 | 90 days supply | Qty: 90 | Fill #3

## 2017-11-07 MED FILL — SPIRONOLACTONE 25 MG TABLET: 25 | 30 days supply | Qty: 30 | Fill #4

## 2017-11-07 MED FILL — ENTRESTO 49 MG-51 MG TABLET: 49-51 | 30 days supply | Qty: 60 | Fill #3

## 2017-11-07 MED FILL — ATORVASTATIN CALCIUM 20 MG: 20 | 60 days supply | Qty: 60 | Fill #3

## 2017-12-18 MED FILL — CARVEDILOL 25 MG TABLET: 25 | 30 days supply | Qty: 60 | Fill #5

## 2017-12-18 MED FILL — SPIRONOLACTONE 25 MG TABLET: 25 | 30 days supply | Qty: 30 | Fill #5

## 2017-12-18 MED FILL — ENTRESTO 49 MG-51 MG TABLET: 49-51 | 30 days supply | Qty: 60 | Fill #4

## 2018-01-21 ENCOUNTER — Other Ambulatory Visit (HOSPITAL_COMMUNITY): Payer: Self-pay | Admitting: Cardiology

## 2018-01-21 MED FILL — CARVEDILOL 25 MG TABLET: 25 | 30 days supply | Qty: 60 | Fill #0

## 2018-01-21 MED FILL — ENTRESTO 49 MG-51 MG TABLET: 49-51 | 30 days supply | Qty: 60 | Fill #5

## 2018-01-21 MED FILL — ATORVASTATIN CALCIUM 20 MG: 20 | 60 days supply | Qty: 60 | Fill #4

## 2018-01-21 MED FILL — SPIRONOLACTONE 25 MG TABLET: 25 | 30 days supply | Qty: 30 | Fill #0

## 2018-02-21 MED FILL — CARVEDILOL 25 MG TABLET: 25 | 30 days supply | Qty: 60 | Fill #1

## 2018-02-21 MED FILL — SPIRONOLACTONE 25 MG TABLET: 25 | 30 days supply | Qty: 30 | Fill #1

## 2018-02-21 MED FILL — ENTRESTO 49 MG-51 MG TABLET: 49-51 | 30 days supply | Qty: 60 | Fill #6

## 2018-04-05 ENCOUNTER — Other Ambulatory Visit: Payer: Self-pay | Admitting: Family Medicine

## 2018-04-05 ENCOUNTER — Other Ambulatory Visit (HOSPITAL_COMMUNITY): Payer: Self-pay | Admitting: Internal Medicine

## 2018-04-05 MED FILL — ENTRESTO 49 MG-51 MG TABLET: 49-51 | 30 days supply | Qty: 60 | Fill #7

## 2018-04-05 MED FILL — ATORVASTATIN CALCIUM 20 MG: 20 | 60 days supply | Qty: 60 | Fill #0

## 2018-04-05 MED FILL — FENOFIBRATE 160 MG TABLET: 160 | 90 days supply | Qty: 90 | Fill #0

## 2018-04-05 MED FILL — SPIRONOLACTONE 25 MG TABLET: 25 | 30 days supply | Qty: 30 | Fill #2

## 2018-04-05 MED FILL — CARVEDILOL 25 MG TABLET: 25 | 30 days supply | Qty: 60 | Fill #2

## 2018-04-16 ENCOUNTER — Ambulatory Visit (INDEPENDENT_AMBULATORY_CARE_PROVIDER_SITE_OTHER): Payer: BLUE CROSS/BLUE SHIELD | Admitting: Family Medicine

## 2018-04-16 ENCOUNTER — Other Ambulatory Visit: Payer: Self-pay

## 2018-04-16 ENCOUNTER — Encounter: Payer: Self-pay | Admitting: Family Medicine

## 2018-04-16 VITALS — BP 122/80 | HR 74 | Temp 98.9°F | Resp 16 | Ht 71.0 in | Wt 226.5 lb

## 2018-04-16 DIAGNOSIS — E669 Obesity, unspecified: Secondary | ICD-10-CM | POA: Insufficient documentation

## 2018-04-16 DIAGNOSIS — Z125 Encounter for screening for malignant neoplasm of prostate: Secondary | ICD-10-CM

## 2018-04-16 DIAGNOSIS — I5022 Chronic systolic (congestive) heart failure: Secondary | ICD-10-CM

## 2018-04-16 DIAGNOSIS — Z0000 Encounter for general adult medical examination without abnormal findings: Secondary | ICD-10-CM

## 2018-04-16 DIAGNOSIS — Z23 Encounter for immunization: Secondary | ICD-10-CM | POA: Diagnosis not present

## 2018-04-16 LAB — PSA: PSA: 0.59 ng/mL (ref 0.10–4.00)

## 2018-04-16 LAB — BASIC METABOLIC PANEL
BUN: 15 mg/dL (ref 6–23)
CALCIUM: 9.8 mg/dL (ref 8.4–10.5)
CO2: 26 mEq/L (ref 19–32)
Chloride: 103 mEq/L (ref 96–112)
Creatinine, Ser: 0.95 mg/dL (ref 0.40–1.50)
GFR: 85.41 mL/min (ref >60.00)
Glucose, Bld: 97 mg/dL (ref 70–99)
POTASSIUM: 4.2 meq/L (ref 3.5–5.1)
SODIUM: 137 meq/L (ref 135–145)

## 2018-04-16 LAB — LIPID PANEL
CHOL/HDL RATIO: 6
Cholesterol: 120 mg/dL (ref 0–200)
HDL: 21.6 mg/dL — ABNORMAL LOW (ref >39.00)
LDL CALC: 73 mg/dL (ref 0–99)
NonHDL: 98.11
TRIGLYCERIDES: 128 mg/dL (ref 0.0–149.0)
VLDL: 25.6 mg/dL (ref 0.0–40.0)

## 2018-04-16 LAB — CBC WITH DIFFERENTIAL/PLATELET
BASOS ABS: 0 10*3/uL (ref 0.0–0.1)
Basophils Relative: 0.3 % (ref 0.0–3.0)
EOS PCT: 2 % (ref 0.0–5.0)
Eosinophils Absolute: 0.1 10*3/uL (ref 0.0–0.7)
HEMATOCRIT: 42.9 % (ref 39.0–52.0)
HEMOGLOBIN: 14.5 g/dL (ref 13.0–17.0)
LYMPHS ABS: 2.2 10*3/uL (ref 0.7–4.0)
LYMPHS PCT: 32.1 % (ref 12.0–46.0)
MCHC: 33.8 g/dL (ref 30.0–36.0)
MCV: 91.8 fl (ref 78.0–100.0)
MONOS PCT: 7.4 % (ref 3.0–12.0)
Monocytes Absolute: 0.5 10*3/uL (ref 0.1–1.0)
Neutro Abs: 3.9 10*3/uL (ref 1.4–7.7)
Neutrophils Relative %: 58.2 % (ref 43.0–77.0)
Platelets: 282 10*3/uL (ref 150.0–400.0)
RBC: 4.67 Mil/uL (ref 4.22–5.81)
RDW: 12.3 % (ref 11.5–15.5)
WBC: 6.7 10*3/uL (ref 4.0–10.5)

## 2018-04-16 LAB — HEPATIC FUNCTION PANEL
ALBUMIN: 4.8 g/dL (ref 3.5–5.2)
ALK PHOS: 39 U/L (ref 39–117)
ALT: 31 U/L (ref 0–53)
AST: 20 U/L (ref 0–37)
Bilirubin, Direct: 0.1 mg/dL (ref 0.0–0.3)
TOTAL PROTEIN: 7.2 g/dL (ref 6.0–8.3)
Total Bilirubin: 0.8 mg/dL (ref 0.2–1.2)

## 2018-04-16 LAB — TSH: TSH: 2.04 u[IU]/mL (ref 0.35–4.50)

## 2018-04-16 NOTE — Assessment & Plan Note (Signed)
Chronic problem, following w/ Cards. 

## 2018-04-16 NOTE — Assessment & Plan Note (Signed)
Ongoing issue for pt.  Stressed need for healthy diet and regular exercise.  Will follow. 

## 2018-04-16 NOTE — Progress Notes (Signed)
   Subjective:    Patient ID: [name], male    DOB: 1970-07-06, 48 y.o.   MRN: [id number]  HPI CPE- UTD on Tdap (due in July), due for flu.  No concerns today.   Review of Systems Patient reports no vision/hearing changes, anorexia, fever ,adenopathy, persistant/recurrent hoarseness, swallowing issues, chest pain, palpitations, edema, persistant/recurrent cough, hemoptysis, dyspnea (rest,exertional, paroxysmal nocturnal), gastrointestinal  bleeding (melena, rectal bleeding), abdominal pain, excessive heart burn, GU symptoms (dysuria, hematuria, voiding/incontinence issues) syncope, focal weakness, memory loss, numbness & tingling, skin/hair/nail changes, depression, anxiety, abnormal bruising/bleeding, musculoskeletal symptoms/signs.     Objective:   Physical Exam BP 122/80   Pulse 74   Temp 98.9 F (37.2 C) (Oral)   Resp 16   Ht 5\' 11"  (1.803 m)   Wt 226 lb 8 oz (102.7 kg)   SpO2 98%   BMI 31.59 kg/m   General Appearance:    Alert, cooperative, no distress, appears stated age  Head:    Normocephalic, without obvious abnormality, atraumatic  Eyes:    PERRL, conjunctiva/corneas clear, EOM's intact, fundi    benign, both eyes       Ears:    Normal TM's and external ear canals, both ears  Nose:   Nares normal, septum midline, mucosa normal, no drainage   or sinus tenderness  Throat:   Lips, mucosa, and tongue normal; teeth and gums normal  Neck:   Supple, symmetrical, trachea midline, no adenopathy;       thyroid:  No enlargement/tenderness/nodules  Back:     Symmetric, no curvature, ROM normal, no CVA tenderness  Lungs:     Clear to auscultation bilaterally, respirations unlabored  Chest wall:    No tenderness or deformity  Heart:    Regular rate and rhythm, S1 and S2 normal, no murmur, rub   or gallop  Abdomen:     Soft, non-tender, bowel sounds active all four quadrants,    no masses, no organomegaly  Genitalia:    Normal male without lesion, discharge or tenderness   Rectal:    Normal tone, normal prostate, no masses or tenderness  Extremities:   Extremities normal, atraumatic, no cyanosis or edema  Pulses:   2+ and symmetric all extremities  Skin:   Skin color, texture, turgor normal, no rashes or lesions  Lymph nodes:   Cervical, supraclavicular, and axillary nodes normal  Neurologic:   CNII-XII intact. Normal strength, sensation and reflexes      throughout          Assessment & Plan:

## 2018-04-16 NOTE — Patient Instructions (Signed)
Follow up in 6 months to recheck BP and cholesterol We'll notify you of your lab results and make any changes if needed Continue to work on healthy diet and regular exercise- you can do it! Call with any questions or concerns Happy New Year!!! 

## 2018-04-16 NOTE — Assessment & Plan Note (Signed)
Pt's PE WNL.  Flu shot given.  Check labs.  Anticipatory guidance provided.

## 2018-04-17 ENCOUNTER — Ambulatory Visit (HOSPITAL_COMMUNITY)
Admission: RE | Admit: 2018-04-17 | Discharge: 2018-04-17 | Disposition: A | Discharge status: Home / Self Care | Payer: BLUE CROSS/BLUE SHIELD | Source: Ambulatory Visit | Attending: Internal Medicine | Admitting: Internal Medicine

## 2018-04-17 ENCOUNTER — Other Ambulatory Visit: Payer: Self-pay

## 2018-04-17 ENCOUNTER — Encounter: Payer: Self-pay | Admitting: General Practice

## 2018-04-17 ENCOUNTER — Encounter (HOSPITAL_COMMUNITY): Payer: Self-pay | Admitting: Internal Medicine

## 2018-04-17 VITALS — BP 124/74 | HR 73 | Wt 225.6 lb

## 2018-04-17 DIAGNOSIS — I472 Ventricular tachycardia: Secondary | ICD-10-CM | POA: Insufficient documentation

## 2018-04-17 DIAGNOSIS — E785 Hyperlipidemia, unspecified: Secondary | ICD-10-CM | POA: Insufficient documentation

## 2018-04-17 DIAGNOSIS — Z833 Family history of diabetes mellitus: Secondary | ICD-10-CM | POA: Diagnosis not present

## 2018-04-17 DIAGNOSIS — I110 Hypertensive heart disease with heart failure: Secondary | ICD-10-CM | POA: Diagnosis not present

## 2018-04-17 DIAGNOSIS — E669 Obesity, unspecified: Secondary | ICD-10-CM | POA: Insufficient documentation

## 2018-04-17 DIAGNOSIS — Z7982 Long term (current) use of aspirin: Secondary | ICD-10-CM | POA: Insufficient documentation

## 2018-04-17 DIAGNOSIS — I428 Other cardiomyopathies: Secondary | ICD-10-CM | POA: Diagnosis not present

## 2018-04-17 DIAGNOSIS — Z8249 Family history of ischemic heart disease and other diseases of the circulatory system: Secondary | ICD-10-CM | POA: Insufficient documentation

## 2018-04-17 DIAGNOSIS — G4733 Obstructive sleep apnea (adult) (pediatric): Secondary | ICD-10-CM | POA: Diagnosis not present

## 2018-04-17 DIAGNOSIS — I5022 Chronic systolic (congestive) heart failure: Secondary | ICD-10-CM | POA: Insufficient documentation

## 2018-04-17 DIAGNOSIS — Z79899 Other long term (current) drug therapy: Secondary | ICD-10-CM | POA: Diagnosis not present

## 2018-04-17 MED ORDER — SACUBITRIL-VALSARTAN 97-103 MG PO TABS: 1.0000 | ORAL_TABLET | Freq: Two times a day (BID) | ORAL | 6 refills | Status: DC

## 2018-04-17 MED FILL — ENTRESTO 97 MG-103 MG TAB: 97-103 | 30 days supply | Qty: 60 | Fill #0

## 2018-04-17 NOTE — Patient Instructions (Signed)
INCREASE Entresto to 97/103mg  twice a day.  Your physician has requested that you have an echocardiogram. Echocardiography is a painless test that uses sound waves to create images of your heart. It provides your doctor with information about the size and shape of your heart and how well your heart's chambers and valves are working. This procedure takes approximately one hour. There are no restrictions for this procedure.  Your physician wants you to follow-up in: 12 MONTHS You will receive a reminder letter in the mail two months in advance. If you don't receive a letter, please call our office to schedule the follow-up appointment.

## 2018-04-17 NOTE — Progress Notes (Signed)
Patient ID: [name], male   DOB: January 02, 1971, 48 y.o.   MRN: [id number] PCP: Dr Beverely Low Pulmonary: Dr Vassie Loll  HPI: [name] is a 48 y.o. male with a history of ETOH consumption, obesity, and newly diagnosed acute systolic heart failure 05/31/13.   Admitted to [facility] 05/31/13 with dyspnea and lower extremity edema which was from acute systolic heart failure. Echo LVEF 10% with global HK, large LV thrombus and severe RV dysfunction.  LHC 05/2013 normal cors. Required short course of Milrinone. He was discharged with lifevest.  Discharge weight was 191 pounds.   He presents for routine f/u with his wife. Feeling very good. Working every day. No CP or SOB. No edema. Taking meds regularly.   Echo from 10/03/16 reviewed personally EF 35-40%  ECHO 06/01/13 EF 10%  ECHO 09/03/13  EF 25-30% RV normal No LV thrombus cMRI 8/15 EF 31% no scar Bedside echo (11/11/13) EF 30-35% ECHO 11/15 EF 40%, diffuse hypokinesis ECHO 5/16 EF 40-45%, mild to moderately dilated LV, normal RV size and systolic function  CPX (2/16) with peak VO2 27.4, VE/VCO2 slope 27.4, RER 1.28 => low normal functional capacity.                                                                                                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 Prior heavy ETOH, now rare.   FH: Mom: HTN, DM       Grandmother: HTN, DM, heart disease   Review of systems complete and found to be negative unless listed in HPI.    Past Medical History:  Diagnosis Date  . Athlete's foot   . CHF (congestive heart failure) (HCC)    a) NICM b) ECHO (05/2013) EF 10%, diff HK, large mural thrombus, mild MR, LA sev. dilated, RA mildly dilated, mod TR c) Swan GC (06/02/13): CVP 19, PA 41/25 (33), PCWP 29, Thermo CO/CI 2.4 / 1.1 d) cMRI EF 31%, no definite myocardial delayed enhancement   . Hypertension   . NICM (nonischemic cardiomyopathy) (HCC)    a) LHC (06/04/2013): normal coronary arteries     Current Outpatient Medications  Medication Sig Dispense Refill  . aspirin EC 81 MG tablet Take 81 mg by mouth daily.    Marland Kitchen atorvastatin (LIPITOR) 20 MG tablet TAKE 1 TABLET BY MOUTH ONCE DAILY 60 tablet 6  . carvedilol (COREG) 25 MG tablet TAKE 1 TABLET BY MOUTH 2 TIMES DAILY WITH A MEAL. 60 tablet 2  . ENTRESTO 49-51 MG TAKE 1 TABLET BY MOUTH 2 TIMES DAILY. 60 tablet PRN  . fenofibrate 160 MG tablet TAKE 1 TABLET BY MOUTH DAILY. 90 tablet 3  . Omega-3 Fatty Acids (FISH OIL PO) Take by mouth.    . spironolactone (ALDACTONE) 25 MG tablet TAKE 1 TABLET (25 MG TOTAL) BY MOUTH DAILY. 30 tablet 2  . fluticasone (FLONASE) 50 MCG/ACT nasal spray Place 2 sprays into both nostrils daily. (Patient not taking: Reported on 10/04/2017) 16 g 1   No current facility-administered medications for this encounter.  Vitals:   04/17/18 1004  BP: 124/74  Pulse: 73  SpO2: 98%  Weight: 102.3 kg (225 lb 9.6 oz)    Wt Readings from Last 3 Encounters:  04/17/18 102.3 kg (225 lb 9.6 oz)  04/16/18 102.7 kg (226 lb 8 oz)  10/04/17 101.7 kg (224 lb 2 oz)    PHYSICAL EXAM: General:  Well appearing. No resp difficulty HEENT: normal Neck: supple. no JVD. Carotids 2+ bilat; no bruits. No lymphadenopathy or thryomegaly appreciated. Cor: PMI nondisplaced. Regular rate & rhythm. No rubs, gallops or murmurs. Lungs: clear Abdomen: soft, nontender, nondistended. No hepatosplenomegaly. No bruits or masses. Good bowel sounds. Extremities: no cyanosis, clubbing, rash, edema Neuro: alert & orientedx3, cranial nerves grossly intact. moves all 4 extremities w/o difficulty. Affect pleasant   ASSESSMENT & PLAN:  1. Chronic Systolic Heart Failure: Nonischemic cardiomyopathy (ETOH may have played a role), echo with EF 10% in 3/15. ECHO 07/2014 40-45%.  - cMRI 10/2013 with LVEF 31%, Normal RV, No LGE.  - Echo 7/18 EF 35-40% (reviewed personally). Down slightly from previous - NYHA I symptoms - Volume status stable on  exam - Not on diuretics - Continue spiro 25 mg daily - Continue coreg 25 mg BID - Increase Entresto to 97/103 mg BID.  - Reinforced fluid restriction to < 2 L daily, sodium restriction to less than 2000 mg daily, and the importance of daily weights.   - Repeat echo  2. NSVT:  - Resolved. EF improved to out of ICD range.  3. LV thrombus:  - Resolved. Not noted on most recent Echo.  - Continue 81 mg ASA daily.  4. Mild OSA:  - Per Dr Vassie Loll mild sleep apnea. No intervention needed at this time. Still snoring. If getting worse will arrange for home sleep study 5. Hyperlipidemia:  - Continue statin.  - PCP following lipids.   Arvilla Meres, MD  04/17/2018

## 2018-05-01 ENCOUNTER — Ambulatory Visit (HOSPITAL_COMMUNITY)
Admission: RE | Admit: 2018-05-01 | Discharge: 2018-05-01 | Disposition: A | Discharge status: Home / Self Care | Payer: BLUE CROSS/BLUE SHIELD | Source: Ambulatory Visit | Attending: Internal Medicine | Admitting: Internal Medicine

## 2018-05-01 DIAGNOSIS — I5022 Chronic systolic (congestive) heart failure: Secondary | ICD-10-CM | POA: Diagnosis not present

## 2018-05-01 DIAGNOSIS — I428 Other cardiomyopathies: Secondary | ICD-10-CM | POA: Diagnosis not present

## 2018-05-01 DIAGNOSIS — I110 Hypertensive heart disease with heart failure: Secondary | ICD-10-CM | POA: Insufficient documentation

## 2018-05-01 NOTE — Progress Notes (Signed)
  Echocardiogram 2D Echocardiogram has been performed.  [name] 05/01/2018, 11:48 AM

## 2018-05-07 ENCOUNTER — Telehealth (HOSPITAL_COMMUNITY): Payer: Self-pay

## 2018-05-07 NOTE — Telephone Encounter (Signed)
-----   Message from Dolores Patty, MD sent at 05/03/2018  9:17 PM EST ----- EF stable 45-50%. Please let him know

## 2018-05-07 NOTE — Telephone Encounter (Signed)
Pt made aware, agreeable and verbalized understanding of results

## 2018-05-09 ENCOUNTER — Other Ambulatory Visit (HOSPITAL_COMMUNITY): Payer: Self-pay | Admitting: Cardiology

## 2018-05-09 MED FILL — CARVEDILOL 25 MG TABLET: 25 | 30 days supply | Qty: 60 | Fill #0

## 2018-05-09 MED FILL — SPIRONOLACTONE 25 MG TABLET: 25 | 30 days supply | Qty: 30 | Fill #0

## 2018-06-01 MED FILL — ATORVASTATIN 20 MG TABLET: 20 | 60 days supply | Qty: 60 | Fill #1

## 2018-06-01 MED FILL — CARVEDILOL 25 MG TABLET: 25 | 30 days supply | Qty: 60 | Fill #1 | Status: TO

## 2018-06-01 MED FILL — ENTRESTO 97 MG-103 MG TAB: 97-103 | 30 days supply | Qty: 60 | Fill #1 | Status: TO

## 2018-06-01 MED FILL — SPIRONOLACTONE 25 MG TABLET: 25 | 30 days supply | Qty: 30 | Fill #1 | Status: TO

## 2018-07-01 MED FILL — SPIRONOLACTONE 25 MG TABS: 25 | 30 days supply | Qty: 30 | Fill #0

## 2018-07-01 MED FILL — CARVEDILOL 25 MG TABLET: 25 | 30 days supply | Qty: 60 | Fill #0

## 2018-07-01 MED FILL — FENOFIBRATE 160 MG TABLET: 160 | 90 days supply | Qty: 90 | Fill #0

## 2018-07-01 MED FILL — ENTRESTO 97 MG-103 MG TAB: 97-103 | 30 days supply | Qty: 60 | Fill #0 | Status: TO

## 2018-08-16 ENCOUNTER — Other Ambulatory Visit (HOSPITAL_COMMUNITY): Payer: Self-pay | Admitting: Internal Medicine

## 2018-08-16 MED FILL — ENTRESTO 97 MG-103 MG TAB: 97-103 | 30 days supply | Qty: 60 | Fill #0

## 2018-08-16 MED FILL — ATORVASTATIN 20 MG TABLET: 20 | 60 days supply | Qty: 60 | Fill #2

## 2018-08-16 MED FILL — CARVEDILOL 25 MG TABLET: 25 | 30 days supply | Qty: 60 | Fill #0

## 2018-08-16 MED FILL — SPIRONOLACTONE 25 MG TABLET: 25 | 30 days supply | Qty: 30 | Fill #0

## 2018-09-17 ENCOUNTER — Telehealth (HOSPITAL_COMMUNITY): Payer: Self-pay | Admitting: Pharmacy Technician

## 2018-09-17 NOTE — Telephone Encounter (Signed)
Advanced Heart Failure Patient Advocate Encounter  Prior Authorization for Delene Loll has been approved.    PA# [id number] Effective dates: 03/11/2014 through 03/19/2038  Jodelle Gross (CPhT) [place] Patient Advocate  09/17/2018 1:57 PM

## 2018-09-30 ENCOUNTER — Other Ambulatory Visit (HOSPITAL_COMMUNITY): Payer: Self-pay | Admitting: Internal Medicine

## 2018-09-30 MED FILL — SPIRONOLACTONE 25 MG TABLET: 25 | 30 days supply | Qty: 30 | Fill #0

## 2018-09-30 MED FILL — CARVEDILOL 25 MG TABLET: 25 | 30 days supply | Qty: 60 | Fill #0

## 2018-09-30 MED FILL — ENTRESTO 97 MG-103 MG TAB: 97-103 | 30 days supply | Qty: 60 | Fill #1

## 2018-10-15 ENCOUNTER — Ambulatory Visit: Payer: BLUE CROSS/BLUE SHIELD | Admitting: Family Medicine

## 2018-10-30 ENCOUNTER — Other Ambulatory Visit: Payer: Self-pay

## 2018-10-30 ENCOUNTER — Ambulatory Visit (INDEPENDENT_AMBULATORY_CARE_PROVIDER_SITE_OTHER): Payer: BC Managed Care – PPO | Admitting: Family Medicine

## 2018-10-30 ENCOUNTER — Encounter: Payer: Self-pay | Admitting: Family Medicine

## 2018-10-30 VITALS — Ht 72.0 in | Wt 222.0 lb

## 2018-10-30 DIAGNOSIS — E785 Hyperlipidemia, unspecified: Secondary | ICD-10-CM

## 2018-10-30 DIAGNOSIS — E669 Obesity, unspecified: Secondary | ICD-10-CM

## 2018-10-30 DIAGNOSIS — I10 Essential (primary) hypertension: Secondary | ICD-10-CM

## 2018-10-30 NOTE — Progress Notes (Signed)
I have discussed the procedure for the virtual visit with the patient who has given consent to proceed with assessment and treatment.   Jessica L Brodmerkel, CMA     

## 2018-10-30 NOTE — Addendum Note (Signed)
Addended by: Midge Minium on: 10/30/2018 11:16 AM   Modules accepted: Orders

## 2018-10-30 NOTE — Progress Notes (Signed)
   Virtual Visit via Video   I connected with patient on 10/30/18 at 10:30 AM EDT by a video enabled telemedicine application and verified that I am speaking with the correct person using two identifiers.  Location patient: Home Location provider: Acupuncturist, Office Persons participating in the virtual visit: Patient, Provider, [place] (Jess B)  I discussed the limitations of evaluation and management by telemedicine and the availability of in person appointments. The patient expressed understanding and agreed to proceed.  Subjective:   HPI:   HTN- chronic problem, on Coreg 25mg  BID, Entresto 97/103mg  BID, Spironolactone 25mg  daily w/ hx of good control.  Unable to check BP today.  No CP, SOB, HAs, visual changes, edema.  Hyperlipidemia- chronic problem, on Lipitor 20mg  daily, Fenofibrate 160mg  daily.  No abd pain, N/V.  Obesity- pt is down 4 lbs since last visit.  Pt reports eating well, very physically active job  ROS:   See pertinent positives and negatives per HPI.  Patient Active Problem List   Diagnosis Date Noted  . Obesity (BMI 30-39.9) 04/16/2018  . HTN (hypertension) 10/04/2015  . Physical exam 03/11/2014  . Chronic systolic heart failure ([place]) 06/16/2013  . Anxiety state, unspecified 05/23/2013  . Acid reflux 05/23/2013  . DERMATOPHYTOSIS OF FOOT 09/27/2009  . Hyperlipidemia 04/06/2009    Social History   Tobacco Use  . Smoking status: Never Smoker  . Smokeless tobacco: Never Used  Substance Use Topics  . Alcohol use: No    Comment: No alcohol since 05/2013    Current Outpatient Medications:  .  aspirin EC 81 MG tablet, Take 81 mg by mouth daily., Disp: , Rfl:  .  atorvastatin (LIPITOR) 20 MG tablet, TAKE 1 TABLET BY MOUTH ONCE DAILY, Disp: 60 tablet, Rfl: 6 .  carvedilol (COREG) 25 MG tablet, TAKE 1 TABLET BY MOUTH 2 TIMES DAILY WITH A MEAL., Disp: 60 tablet, Rfl: 0 .  fenofibrate 160 MG tablet, TAKE 1 TABLET BY MOUTH DAILY., Disp: 90 tablet, Rfl: 3  .  Omega-3 Fatty Acids (FISH OIL PO), Take by mouth., Disp: , Rfl:  .  sacubitril-valsartan (ENTRESTO) 97-103 MG, Take 1 tablet by mouth 2 (two) times daily., Disp: 60 tablet, Rfl: 6 .  spironolactone (ALDACTONE) 25 MG tablet, TAKE 1 TABLET BY MOUTH DAILY., Disp: 30 tablet, Rfl: 0 .  fluticasone (FLONASE) 50 MCG/ACT nasal spray, Place 2 sprays into both nostrils daily. (Patient not taking: Reported on 10/04/2017), Disp: 16 g, Rfl: 1  No Known Allergies  Objective:   Ht 6' (1.829 m)   Wt 222 lb (100.7 kg)   BMI 30.11 kg/m   AAOx3, NAD NCAT, EOMI No obvious CN deficits Coloring WNL Pt is able to speak clearly, coherently without shortness of breath or increased work of breathing.  Thought process is linear.  Mood is appropriate.   Assessment and Plan:   HTN- chronic problem.  Hx of good control.  Not able to get BP today but will check when he comes for labs.  Currently asymptomatic.  No anticipated med changes.  Hyperlipidemia- chronic problem.  Tolerating statin w/o difficulty.  Check labs.  Adjust meds prn   Obesity- pt is down 4 lbs since last visit.  Applauded his efforts at healthy diet.  Will continue to follow.   Annye Asa, MD 10/30/2018

## 2018-10-31 ENCOUNTER — Other Ambulatory Visit (HOSPITAL_COMMUNITY): Payer: Self-pay | Admitting: Internal Medicine

## 2018-10-31 MED FILL — CARVEDILOL 25 MG TABLET: 25 | 30 days supply | Qty: 60 | Fill #0

## 2018-10-31 MED FILL — ENTRESTO 97 MG-103 MG TAB: 97-103 | 30 days supply | Qty: 60 | Fill #2

## 2018-10-31 MED FILL — SPIRONOLACTONE 25 MG TABLET: 25 | 30 days supply | Qty: 30 | Fill #0

## 2018-10-31 MED FILL — ATORVASTATIN 20 MG TABLET: 20 | 60 days supply | Qty: 60 | Fill #3

## 2018-11-01 ENCOUNTER — Other Ambulatory Visit: Payer: Self-pay

## 2018-11-01 ENCOUNTER — Ambulatory Visit (INDEPENDENT_AMBULATORY_CARE_PROVIDER_SITE_OTHER): Payer: BC Managed Care – PPO

## 2018-11-01 DIAGNOSIS — Z23 Encounter for immunization: Secondary | ICD-10-CM

## 2018-11-01 DIAGNOSIS — E785 Hyperlipidemia, unspecified: Secondary | ICD-10-CM | POA: Diagnosis not present

## 2018-11-01 DIAGNOSIS — I10 Essential (primary) hypertension: Secondary | ICD-10-CM

## 2018-11-01 LAB — HEPATIC FUNCTION PANEL
ALT: 35 U/L (ref 0–53)
AST: 22 U/L (ref 0–37)
Albumin: 4.7 g/dL (ref 3.5–5.2)
Alkaline Phosphatase: 35 U/L — ABNORMAL LOW (ref 39–117)
Bilirubin, Direct: 0.1 mg/dL (ref 0.0–0.3)
Total Bilirubin: 0.6 mg/dL (ref 0.2–1.2)
Total Protein: 7 g/dL (ref 6.0–8.3)

## 2018-11-01 LAB — LIPID PANEL
Cholesterol: 138 mg/dL (ref 0–200)
HDL: 23.5 mg/dL — ABNORMAL LOW (ref >39.00)
LDL Cholesterol: 82 mg/dL (ref 0–99)
NonHDL: 114.49
Total CHOL/HDL Ratio: 6
Triglycerides: 160 mg/dL — ABNORMAL HIGH (ref 0.0–149.0)
VLDL: 32 mg/dL (ref 0.0–40.0)

## 2018-11-01 LAB — BASIC METABOLIC PANEL
BUN: 17 mg/dL (ref 6–23)
CO2: 24 mEq/L (ref 19–32)
Calcium: 9.5 mg/dL (ref 8.4–10.5)
Chloride: 104 mEq/L (ref 96–112)
Creatinine, Ser: 0.96 mg/dL (ref 0.40–1.50)
GFR: 84.19 mL/min (ref >60.00)
Glucose, Bld: 99 mg/dL (ref 70–99)
Potassium: 4.2 mEq/L (ref 3.5–5.1)
Sodium: 137 mEq/L (ref 135–145)

## 2018-11-01 LAB — CBC WITH DIFFERENTIAL/PLATELET
Basophils Absolute: 0 10*3/uL (ref 0.0–0.1)
Basophils Relative: 0.4 % (ref 0.0–3.0)
Eosinophils Absolute: 0.1 10*3/uL (ref 0.0–0.7)
Eosinophils Relative: 2.1 % (ref 0.0–5.0)
HCT: 41.3 % (ref 39.0–52.0)
Hemoglobin: 14.1 g/dL (ref 13.0–17.0)
Lymphocytes Relative: 32 % (ref 12.0–46.0)
Lymphs Abs: 2.2 10*3/uL (ref 0.7–4.0)
MCHC: 34.1 g/dL (ref 30.0–36.0)
MCV: 92.4 fl (ref 78.0–100.0)
Monocytes Absolute: 0.5 10*3/uL (ref 0.1–1.0)
Monocytes Relative: 6.9 % (ref 3.0–12.0)
Neutro Abs: 4 10*3/uL (ref 1.4–7.7)
Neutrophils Relative %: 58.6 % (ref 43.0–77.0)
Platelets: 257 10*3/uL (ref 150.0–400.0)
RBC: 4.47 Mil/uL (ref 4.22–5.81)
RDW: 12.8 % (ref 11.5–15.5)
WBC: 6.9 10*3/uL (ref 4.0–10.5)

## 2018-11-01 LAB — TSH: TSH: 1.96 u[IU]/mL (ref 0.35–4.50)

## 2018-11-01 NOTE — Addendum Note (Signed)
Addended by: Midge Minium on: 11/01/2018 11:19 AM   Modules accepted: Level of Service

## 2018-11-01 NOTE — Progress Notes (Addendum)
[name] 48 y.o. male presents to office today for a nurse visit to check BP and for updated TDAP. Per PCP, Annye Asa, MD administered BOOSTRIX 0.5 mL IM left arm. Patient tolerated well.   The above order is mine.  Annye Asa, MD

## 2018-11-13 MED FILL — FENOFIBRATE 160 MG TABLET: 160 | 90 days supply | Qty: 90 | Fill #0

## 2018-12-09 MED FILL — ENTRESTO 97 MG-103 MG TAB: 97-103 | 30 days supply | Qty: 60 | Fill #3

## 2018-12-10 ENCOUNTER — Encounter: Payer: Self-pay | Admitting: Physician Assistant

## 2018-12-10 ENCOUNTER — Ambulatory Visit (INDEPENDENT_AMBULATORY_CARE_PROVIDER_SITE_OTHER): Payer: BC Managed Care – PPO | Admitting: Physician Assistant

## 2018-12-10 ENCOUNTER — Other Ambulatory Visit: Payer: Self-pay

## 2018-12-10 DIAGNOSIS — M545 Low back pain, unspecified: Secondary | ICD-10-CM

## 2018-12-10 MED ORDER — MELOXICAM 15 MG PO TABS: 15.0000 mg | ORAL_TABLET | Freq: Every day | ORAL | 0 refills | Status: DC

## 2018-12-10 MED ORDER — CYCLOBENZAPRINE HCL 10 MG PO TABS: 10.0000 mg | ORAL_TABLET | Freq: Three times a day (TID) | ORAL | 0 refills | Status: DC | PRN

## 2018-12-10 MED FILL — MELOXICAM 15 MG TABLET: 15 | 15 days supply | Qty: 15 | Fill #0

## 2018-12-10 MED FILL — CYCLOBENZAPRINE 10 MG TAB: 10 | 10 days supply | Qty: 30 | Fill #0

## 2018-12-10 NOTE — Progress Notes (Signed)
I have discussed the procedure for the virtual visit with the patient who has given consent to proceed with assessment and treatment.   Rosmary Dionisio S Yohanna Tow, CMA     

## 2018-12-10 NOTE — Patient Instructions (Addendum)
Instructions sent to MyChart.   Please avoid any heavy lifting or overexertion.  Apply heating pad to the area for 10-15 minutes a few times per day. Take the meloxicam once daily with food. Tylenol for breakthrough pain. Take the Flexeril each evening for muscle tension. You can take in the day to but no driving or operating heavy machinery while on medication.  Follow-up if symptoms are not resolving

## 2018-12-10 NOTE — Progress Notes (Signed)
   Virtual Visit via Video   I connected with patient on 12/10/18 at 11:30 AM EDT by a video enabled telemedicine application and verified that I am speaking with the correct person using two identifiers.  Location patient: Home Location provider: [facility], Office Persons participating in the virtual visit: Patient, Provider, [place] (Patina Moore)  I discussed the limitations of evaluation and management by telemedicine and the availability of in person appointments. The patient expressed understanding and agreed to proceed.  Subjective:   HPI:   Patient with history of low back pain presents via [facility] today c/o bilateral low back pain flare since Friday. Notes he keeps very busy at work which is physically strenuous and does do heavy lifting. Denies trauma or injury. Notes spasms in the lower back and is significant. Occasional radiation into thighs bilaterally. Notes laying on his side helps. Pain worsens with position. Has taken some OTC tylenol with only minimal relief. Denies saddle anesthesia, change to bowel or bladder habits. .  ROS:   See pertinent positives and negatives per HPI.  Patient Active Problem List   Diagnosis Date Noted  . Obesity (BMI 30-39.9) 04/16/2018  . HTN (hypertension) 10/04/2015  . Physical exam 03/11/2014  . Chronic systolic heart failure ([place]) 06/16/2013  . Anxiety state, unspecified 05/23/2013  . Acid reflux 05/23/2013  . DERMATOPHYTOSIS OF FOOT 09/27/2009  . Hyperlipidemia 04/06/2009    Social History   Tobacco Use  . Smoking status: Never Smoker  . Smokeless tobacco: Never Used  Substance Use Topics  . Alcohol use: No    Comment: No alcohol since 05/2013    Current Outpatient Medications:  .  aspirin EC 81 MG tablet, Take 81 mg by mouth daily., Disp: , Rfl:  .  atorvastatin (LIPITOR) 20 MG tablet, TAKE 1 TABLET BY MOUTH ONCE DAILY, Disp: 60 tablet, Rfl: 6 .  carvedilol (COREG) 25 MG tablet, TAKE 1 TABLET BY MOUTH 2 TIMES DAILY  WITH A MEAL., Disp: 60 tablet, Rfl: 0 .  fenofibrate 160 MG tablet, TAKE 1 TABLET BY MOUTH DAILY., Disp: 90 tablet, Rfl: 3 .  fluticasone (FLONASE) 50 MCG/ACT nasal spray, Place 2 sprays into both nostrils daily. (Patient not taking: Reported on 10/04/2017), Disp: 16 g, Rfl: 1 .  Omega-3 Fatty Acids (FISH OIL PO), Take by mouth., Disp: , Rfl:  .  sacubitril-valsartan (ENTRESTO) 97-103 MG, Take 1 tablet by mouth 2 (two) times daily., Disp: 60 tablet, Rfl: 6 .  spironolactone (ALDACTONE) 25 MG tablet, TAKE 1 TABLET BY MOUTH DAILY., Disp: 30 tablet, Rfl: 0  No Known Allergies  Objective:   There were no vitals taken for this visit.  Patient is well-developed, well-nourished in no acute distress.  Resting comfortably at home.  Head is normocephalic, atraumatic.  No labored breathing.  Speech is clear and coherent with logical content.  Patient is alert and oriented at baseline.   Assessment and Plan:   1. Acute bilateral low back pain, unspecified whether sciatica present Atraumatic. No alarm signs/symptoms present. Suspect strain from overuse. Rest, heating pad recommended. No heavy lifting. Rx Meloxicam to use once daily with food. Tylenol for breakthrough pain. Rx Flexeril to use at night or up to TID but no driving or operating machinery if taking in the day. Stretches reviewed. Handout sent to MyChart. Follow-up if not resolving or new symptoms develop.   Leeanne Rio, [place] 12/10/2018

## 2018-12-16 ENCOUNTER — Encounter: Payer: Self-pay | Admitting: Physician Assistant

## 2018-12-17 MED ORDER — METHYLPREDNISOLONE 4 MG PO TBPK: ORAL_TABLET | ORAL | 0 refills | Status: DC

## 2018-12-17 MED FILL — METHYLPREDNISOLONE 4 MG TAB: 4 | 6 days supply | Qty: 21 | Fill #0

## 2018-12-23 ENCOUNTER — Other Ambulatory Visit (HOSPITAL_COMMUNITY): Payer: Self-pay | Admitting: Internal Medicine

## 2018-12-23 MED FILL — CARVEDILOL 25 MG TABLET: 25 | 30 days supply | Qty: 60 | Fill #0

## 2018-12-24 ENCOUNTER — Other Ambulatory Visit (HOSPITAL_COMMUNITY): Payer: Self-pay | Admitting: Internal Medicine

## 2018-12-24 MED FILL — SPIRONOLACTONE 25 MG TABLET: 25 | 30 days supply | Qty: 30 | Fill #0

## 2019-01-16 ENCOUNTER — Other Ambulatory Visit (HOSPITAL_COMMUNITY): Payer: Self-pay | Admitting: Internal Medicine

## 2019-01-16 MED FILL — ENTRESTO 97 MG-103 MG TAB: 97-103 | 30 days supply | Qty: 60 | Fill #0

## 2019-01-24 ENCOUNTER — Other Ambulatory Visit (HOSPITAL_COMMUNITY): Payer: Self-pay | Admitting: Internal Medicine

## 2019-01-24 MED FILL — ATORVASTATIN 20 MG TABLET: 20 | 60 days supply | Qty: 60 | Fill #4

## 2019-01-24 MED FILL — SPIRONOLACTONE 25 MG TABLET: 25 | 30 days supply | Qty: 30 | Fill #1

## 2019-01-27 MED FILL — CARVEDILOL 25 MG TABLET: 25 | 30 days supply | Qty: 60 | Fill #0

## 2019-01-28 ENCOUNTER — Ambulatory Visit (INDEPENDENT_AMBULATORY_CARE_PROVIDER_SITE_OTHER): Payer: BC Managed Care – PPO | Admitting: Family Medicine

## 2019-01-28 ENCOUNTER — Other Ambulatory Visit: Payer: Self-pay

## 2019-01-28 ENCOUNTER — Encounter: Payer: Self-pay | Admitting: Family Medicine

## 2019-01-28 VITALS — BP 123/80 | HR 69 | Temp 97.7°F | Resp 16 | Ht 72.0 in | Wt 223.4 lb

## 2019-01-28 DIAGNOSIS — M5410 Radiculopathy, site unspecified: Secondary | ICD-10-CM | POA: Diagnosis not present

## 2019-01-28 MED ORDER — METHOCARBAMOL 750 MG PO TABS: 750.0000 mg | ORAL_TABLET | Freq: Three times a day (TID) | ORAL | 0 refills | Status: DC | PRN

## 2019-01-28 MED FILL — METHOCARBAMOL 750 MG TABS: 750 | 15 days supply | Qty: 45 | Fill #0

## 2019-01-28 NOTE — Patient Instructions (Signed)
Follow up as needed or as scheduled START the steroid pack as directed TAKE the Methocarbamol (muscle relaxer) as needed every 8 hrs for pain/spasm HEAT!! Continue to do gentle stretching to avoid stiffness If no improvement in the next few days, let me know so we can refer to a specialist Call with any questions or concerns Hang in there!!!

## 2019-01-28 NOTE — Progress Notes (Signed)
   Subjective:    Patient ID: [name], male    DOB: 12-Aug-1970, 48 y.o.   MRN: [id number]  HPI Back pain- sxs started 'about a month ago or so'.  Has hx of similar 10 yrs ago.  Resolved w/ medication at that time.  Was working a few weeks ago when he threw heavy boxes of wire up the stairs.  R LBP, radiates into R hip and to just above knees bilaterally.  His compensation for the R back pain is causing L leg pain.  Walking and standing are better than sitting.  + muscle spasm.  Has had massage w/o relief.  Used Meloxicam w/ mild and temporary relief.   Review of Systems For ROS see HPI     Objective:   Physical Exam Constitutional:      General: He is not in acute distress.    Appearance: He is not ill-appearing.     Comments: Obviously uncomfortable   Musculoskeletal:        General: Tenderness (TTP over R PSIS w/ surrounding spasm) present. No deformity.  Neurological:     Mental Status: He is alert and oriented to person, place, and time.     Cranial Nerves: No cranial nerve deficit.     Motor: No weakness.     Coordination: Coordination normal.     Gait: Gait abnormal (antalgic gait).     Deep Tendon Reflexes: Reflexes normal.     Comments: + SLR bilaterally           Assessment & Plan:  Radicular low back pain- new to provider, recurrent problem for pt but sxs have never lasted this long.  Will start steroid taper, muscle relaxer.  Reviewed supportive care and red flags that should prompt return.  Pt expressed understanding and is in agreement w/ plan.

## 2019-02-04 DIAGNOSIS — Z20828 Contact with and (suspected) exposure to other viral communicable diseases: Secondary | ICD-10-CM | POA: Diagnosis not present

## 2019-02-04 DIAGNOSIS — J3489 Other specified disorders of nose and nasal sinuses: Secondary | ICD-10-CM | POA: Diagnosis not present

## 2019-02-05 ENCOUNTER — Other Ambulatory Visit: Payer: Self-pay

## 2019-02-05 DIAGNOSIS — Z20822 Contact with and (suspected) exposure to covid-19: Secondary | ICD-10-CM

## 2019-02-06 LAB — NOVEL CORONAVIRUS, NAA: SARS-CoV-2, NAA: NOT DETECTED

## 2019-02-24 MED FILL — ENTRESTO 97 MG-103 MG TAB: 97-103 | 30 days supply | Qty: 60 | Fill #1

## 2019-03-12 ENCOUNTER — Other Ambulatory Visit (HOSPITAL_COMMUNITY): Payer: Self-pay | Admitting: Internal Medicine

## 2019-03-12 MED FILL — CARVEDILOL 25 MG TABLET: 25 | 30 days supply | Qty: 60 | Fill #0

## 2019-03-12 MED FILL — SPIRONOLACTONE 25 MG TABS: 25 | 30 days supply | Qty: 30 | Fill #2

## 2019-04-04 ENCOUNTER — Other Ambulatory Visit (HOSPITAL_COMMUNITY): Payer: Self-pay | Admitting: Internal Medicine

## 2019-04-04 MED FILL — CARVEDILOL 25 MG TABLET: 25 | 30 days supply | Qty: 60 | Fill #0

## 2019-04-04 MED FILL — ENTRESTO 97 MG-103 MG TAB: 97-103 | 30 days supply | Qty: 60 | Fill #2

## 2019-04-04 MED FILL — ATORVASTATIN 20 MG TABLET: 20 | 60 days supply | Qty: 60 | Fill #5

## 2019-04-04 MED FILL — SPIRONOLACTONE 25 MG TABS: 25 | 30 days supply | Qty: 30 | Fill #0

## 2019-04-04 MED FILL — FENOFIBRATE 160 MG TABLET: 160 | 90 days supply | Qty: 90 | Fill #1

## 2019-04-12 DIAGNOSIS — Z20828 Contact with and (suspected) exposure to other viral communicable diseases: Secondary | ICD-10-CM | POA: Diagnosis not present

## 2019-04-14 ENCOUNTER — Ambulatory Visit: Payer: BC Managed Care – PPO | Attending: Internal Medicine

## 2019-05-16 MED FILL — ENTRESTO 97 MG-103 MG TAB: 97-103 | 30 days supply | Qty: 60 | Fill #3

## 2019-05-16 MED FILL — SPIRONOLACTONE 25 MG TABS: 25 | 30 days supply | Qty: 30 | Fill #1

## 2019-06-02 MED FILL — CARVEDILOL 25 MG TABLET: 25 | 30 days supply | Qty: 60 | Fill #1

## 2019-06-16 ENCOUNTER — Other Ambulatory Visit: Payer: Self-pay | Admitting: Family Medicine

## 2019-06-16 ENCOUNTER — Other Ambulatory Visit (HOSPITAL_COMMUNITY): Payer: Self-pay | Admitting: Internal Medicine

## 2019-06-16 MED FILL — SPIRONOLACTONE 25 MG TABS: 25 | 30 days supply | Qty: 30 | Fill #2

## 2019-06-16 MED FILL — ENTRESTO 97 MG-103 MG TAB: 97-103 | 30 days supply | Qty: 60 | Fill #0

## 2019-06-17 ENCOUNTER — Encounter: Payer: Self-pay | Admitting: Emergency Medicine

## 2019-06-17 MED FILL — ATORVASTATIN 20 MG TABLET: 20 | 30 days supply | Qty: 30 | Fill #0

## 2019-07-14 MED FILL — CARVEDILOL 25 MG TABLET: 25 | 30 days supply | Qty: 60 | Fill #2

## 2019-07-17 ENCOUNTER — Other Ambulatory Visit (HOSPITAL_COMMUNITY): Payer: Self-pay | Admitting: Internal Medicine

## 2019-07-17 MED FILL — FENOFIBRATE 160 MG TABLET: 160 | 90 days supply | Qty: 90 | Fill #0

## 2019-08-01 ENCOUNTER — Other Ambulatory Visit (HOSPITAL_COMMUNITY): Payer: Self-pay | Admitting: Internal Medicine

## 2019-08-19 ENCOUNTER — Other Ambulatory Visit (HOSPITAL_COMMUNITY): Payer: Self-pay | Admitting: Internal Medicine

## 2019-08-19 MED FILL — CARVEDILOL 25 MG TABLET: 25 | 90 days supply | Qty: 180 | Fill #0

## 2019-08-22 ENCOUNTER — Other Ambulatory Visit: Payer: Self-pay | Admitting: Family Medicine

## 2019-08-22 ENCOUNTER — Other Ambulatory Visit (HOSPITAL_COMMUNITY): Payer: Self-pay | Admitting: Internal Medicine

## 2019-08-22 MED FILL — ATORVASTATIN 20 MG TABLET: 20 | 60 days supply | Qty: 60 | Fill #0

## 2019-09-08 MED FILL — ENTRESTO 97 MG-103 MG TAB: 97-103 | 30 days supply | Qty: 60 | Fill #2

## 2019-09-10 ENCOUNTER — Ambulatory Visit (HOSPITAL_COMMUNITY)
Admission: RE | Admit: 2019-09-10 | Discharge: 2019-09-10 | Disposition: A | Discharge status: Home / Self Care | Payer: BLUE CROSS/BLUE SHIELD | Source: Ambulatory Visit | Attending: Internal Medicine | Admitting: Internal Medicine

## 2019-09-10 ENCOUNTER — Encounter (HOSPITAL_COMMUNITY): Payer: Self-pay | Admitting: Internal Medicine

## 2019-09-10 ENCOUNTER — Other Ambulatory Visit: Payer: Self-pay

## 2019-09-10 VITALS — BP 117/72 | HR 72 | Wt 233.0 lb

## 2019-09-10 DIAGNOSIS — I5022 Chronic systolic (congestive) heart failure: Secondary | ICD-10-CM | POA: Insufficient documentation

## 2019-09-10 DIAGNOSIS — E669 Obesity, unspecified: Secondary | ICD-10-CM | POA: Insufficient documentation

## 2019-09-10 DIAGNOSIS — I428 Other cardiomyopathies: Secondary | ICD-10-CM | POA: Diagnosis not present

## 2019-09-10 DIAGNOSIS — I110 Hypertensive heart disease with heart failure: Secondary | ICD-10-CM | POA: Insufficient documentation

## 2019-09-10 DIAGNOSIS — E785 Hyperlipidemia, unspecified: Secondary | ICD-10-CM | POA: Diagnosis not present

## 2019-09-10 DIAGNOSIS — G4733 Obstructive sleep apnea (adult) (pediatric): Secondary | ICD-10-CM | POA: Diagnosis not present

## 2019-09-10 DIAGNOSIS — Z8249 Family history of ischemic heart disease and other diseases of the circulatory system: Secondary | ICD-10-CM | POA: Insufficient documentation

## 2019-09-10 DIAGNOSIS — Z7982 Long term (current) use of aspirin: Secondary | ICD-10-CM | POA: Insufficient documentation

## 2019-09-10 DIAGNOSIS — Z79899 Other long term (current) drug therapy: Secondary | ICD-10-CM | POA: Diagnosis not present

## 2019-09-10 DIAGNOSIS — Z86718 Personal history of other venous thrombosis and embolism: Secondary | ICD-10-CM | POA: Diagnosis not present

## 2019-09-10 NOTE — Addendum Note (Signed)
Encounter addended by: Chinita Pester, CMA on: 09/10/2019 3:15 PM  Actions taken: Clinical Note Signed

## 2019-09-10 NOTE — Progress Notes (Signed)
[facility] NOTE   Patient ID: [name], male   DOB: Jan 27, 1971, 49 y.o.   MRN: [id number] PCP: Dr Birdie Riddle Pulmonary: Dr Elsworth Soho  HPI: [name] is a 49 y.o. male with a history of ETOH consumption, obesity, and newly diagnosed acute systolic heart failure 5/95/63.   Admitted to [facility]/13 with dyspnea and lower extremity edema which was from acute systolic heart failure. Echo LVEF 10% with global HK, large LV thrombus and severe RV dysfunction.  LHC 05/2013 normal cors. Required short course of Milrinone. He was discharged with lifevest.  Discharge weight was 191 pounds.   He presents for routine f/u. Feels great. No CP, SOB or edema. Working full-time. No problems with meds.   Echo 2/20 EF 45-50%  Echo 7/18 EF 35-40%  ECHO 06/01/13 EF 10%  ECHO 09/03/13  EF 25-30% RV normal No LV thrombus cMRI 8/15 EF 31% no scar Bedside echo (11/11/13) EF 30-35% ECHO 11/15 EF 40%, diffuse hypokinesis ECHO 5/16 EF 40-45%, mild to moderately dilated LV, normal RV size and systolic function  CPX (8/75) with peak VO2 27.4, VE/VCO2 slope 27.4, RER 1.28 => low normal functional capacity.                                                                                                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 Prior heavy ETOH, now rare.   FH: Mom: HTN, DM       Grandmother: HTN, DM, heart disease   Review of systems complete and found to be negative unless listed in HPI.    Past Medical History:  Diagnosis Date  . Athlete's foot   . CHF (congestive heart failure) (HCC)    a) NICM b) ECHO (05/2013) EF 10%, diff HK, large mural thrombus, mild MR, LA sev. dilated, RA mildly dilated, mod TR c) [facility] GC (06/02/13): CVP 19, PA 41/25 (33), PCWP 29, Thermo CO/CI 2.4 / 1.1 d) cMRI EF 31%, no definite myocardial delayed enhancement   . Hypertension   . NICM (nonischemic cardiomyopathy) ([place])    a) LHC (06/04/2013): normal coronary arteries     Current Outpatient Medications  Medication Sig Dispense Refill  . aspirin EC 81 MG tablet Take 81 mg by mouth daily.    Marland Kitchen atorvastatin (LIPITOR) 20 MG tablet TAKE 1 TABLET BY MOUTH ONCE DAILY 60 tablet 6  . carvedilol (COREG) 25 MG tablet Take 1 tablet (25 mg total) by mouth 2 (two) times daily with a meal. Needs appt for further refills 180 tablet 0  . fenofibrate 160 MG tablet Take 1 tablet (160 mg total) by mouth daily. Needs appt for further refills 90 tablet 0  . sacubitril-valsartan (ENTRESTO) 97-103 MG Take 1 tablet by mouth 2 (two) times daily. Needs appt for further refills 60 tablet 3  . spironolactone (ALDACTONE) 25 MG tablet Take 1 tablet (25 mg total) by mouth daily. Needs appt for further refills 90 tablet 0  . Omega-3 Fatty Acids (FISH OIL PO) Take by mouth. (Patient not taking: Reported  on 09/10/2019)     No current facility-administered medications for this encounter.   Vitals:   09/10/19 1454  BP: 117/72  Pulse: 72  SpO2: 98%  Weight: 105.7 kg (233 lb)    Wt Readings from Last 3 Encounters:  09/10/19 105.7 kg (233 lb)  01/28/19 101.3 kg (223 lb 6 oz)  10/30/18 100.7 kg (222 lb)    PHYSICAL EXAM: General:  Well appearing. No resp difficulty HEENT: normal Neck: supple. no JVD. Carotids 2+ bilat; no bruits. No lymphadenopathy or thryomegaly appreciated. Cor: PMI nondisplaced. Regular rate & rhythm. No rubs, gallops or murmurs. Lungs: clear Abdomen: soft, nontender, nondistended. No hepatosplenomegaly. No bruits or masses. Good bowel sounds. Extremities: no cyanosis, clubbing, rash, edema Neuro: alert & orientedx3, cranial nerves grossly intact. moves all 4 extremities w/o difficulty. Affect pleasant    ASSESSMENT & PLAN:  1. Chronic Systolic Heart Failure: Nonischemic cardiomyopathy (ETOH may have played a role), echo with EF 10% in 3/15. ECHO 07/2014 40-45%.  - cMRI 10/2013 with LVEF 31%, Normal RV, No LGE.  - Echo 7/18 EF 35-40%  - Echo 2/20 EF 45-50% -  NYHA I  - Volume looks good - Not on diuretics - Continue spiro 25 mg daily - Continue coreg 25 mg BID - Continue Entresto to 97/103 mg BID.  2. NSVT:  - Resolved. EF improved to out of ICD range.  3. LV thrombus:  - Resolved.  4. Mild OSA:  - Per Dr Vassie Loll mild sleep apnea. No intervention needed at this time. Still snoring. If getting worse will arrange for home sleep study 5. Hyperlipidemia:  - Continue statin.   - PCP following lipids.   Arvilla Meres, [place]  09/10/2019

## 2019-09-10 NOTE — Patient Instructions (Signed)
No labs done today.  No medications changes were made. Please continue all current medications were made.   Your physician recommends that you schedule a follow-up appointment in: 1 year. Our office will contact you at a later date to schedule an appointment.  If you have any questions or concerns before your next appointment please send [place] a message through [place] or call our office at [phone].    TO LEAVE A MESSAGE FOR THE NURSE SELECT OPTION 2, PLEASE LEAVE A MESSAGE INCLUDING: . YOUR NAME . DATE OF BIRTH . CALL BACK NUMBER . REASON FOR CALL**this is important as we prioritize the call backs  YOU WILL RECEIVE A CALL BACK THE SAME DAY AS LONG AS YOU CALL BEFORE 4:00 PM   At the [facility], you and your health needs are our priority. As part of our continuing mission to provide you with exceptional heart care, we have created designated Provider Care Teams. These Care Teams include your primary Cardiologist (physician) and Advanced Practice Providers (APPs- Physician Assistants and Nurse Practitioners) who all work together to provide you with the care you need, when you need it.   You may see any of the following providers on your designated Care Team at your next follow up: Marland Kitchen Dr Arvilla Meres . Dr Marca Ancona . Tonye Becket, NP . Robbie Lis, PA . Karle Plumber, PharmD   Please be sure to bring in all your medications bottles to every appointment.

## 2019-10-10 MED FILL — ATORVASTATIN 20 MG TABLET: 20 | 60 days supply | Qty: 60 | Fill #1

## 2019-10-10 MED FILL — ENTRESTO 97 MG-103 MG TAB: 97-103 | 30 days supply | Qty: 60 | Fill #3

## 2019-10-15 MED FILL — ENTRESTO 97 MG-103 MG TAB: 97-103 | 30 days supply | Qty: 60 | Fill #3

## 2019-11-10 ENCOUNTER — Other Ambulatory Visit (HOSPITAL_COMMUNITY): Payer: Self-pay | Admitting: Internal Medicine

## 2019-11-10 MED FILL — FENOFIBRATE 160 MG TABLET: 160 | 90 days supply | Qty: 90 | Fill #0

## 2019-11-21 ENCOUNTER — Other Ambulatory Visit (HOSPITAL_COMMUNITY): Payer: Self-pay | Admitting: Internal Medicine

## 2019-11-21 MED FILL — ENTRESTO 97 MG-103 MG TAB: 97-103 | 30 days supply | Qty: 60 | Fill #0

## 2019-11-25 ENCOUNTER — Other Ambulatory Visit (HOSPITAL_COMMUNITY): Payer: Self-pay | Admitting: Internal Medicine

## 2019-11-25 MED FILL — CARVEDILOL 25 MG TABLET: 25 | 90 days supply | Qty: 180 | Fill #0

## 2019-11-26 ENCOUNTER — Other Ambulatory Visit (HOSPITAL_COMMUNITY): Payer: Self-pay | Admitting: Internal Medicine

## 2019-12-16 ENCOUNTER — Encounter: Payer: Self-pay | Admitting: Family Medicine

## 2019-12-17 ENCOUNTER — Other Ambulatory Visit: Payer: Self-pay

## 2019-12-17 ENCOUNTER — Encounter: Payer: Self-pay | Admitting: Family Medicine

## 2019-12-17 ENCOUNTER — Ambulatory Visit (INDEPENDENT_AMBULATORY_CARE_PROVIDER_SITE_OTHER): Payer: BLUE CROSS/BLUE SHIELD | Admitting: Family Medicine

## 2019-12-17 VITALS — BP 122/81 | HR 74 | Temp 97.9°F | Resp 16 | Ht 72.0 in | Wt 229.4 lb

## 2019-12-17 DIAGNOSIS — E669 Obesity, unspecified: Secondary | ICD-10-CM

## 2019-12-17 DIAGNOSIS — Z23 Encounter for immunization: Secondary | ICD-10-CM | POA: Diagnosis not present

## 2019-12-17 DIAGNOSIS — Z125 Encounter for screening for malignant neoplasm of prostate: Secondary | ICD-10-CM | POA: Diagnosis not present

## 2019-12-17 DIAGNOSIS — Z0000 Encounter for general adult medical examination without abnormal findings: Secondary | ICD-10-CM

## 2019-12-17 LAB — CBC WITH DIFFERENTIAL/PLATELET
Basophils Absolute: 0 10*3/uL (ref 0.0–0.1)
Basophils Relative: 0.5 % (ref 0.0–3.0)
Eosinophils Absolute: 0.2 10*3/uL (ref 0.0–0.7)
Eosinophils Relative: 3.1 % (ref 0.0–5.0)
HCT: 42.3 % (ref 39.0–52.0)
Hemoglobin: 14.3 g/dL (ref 13.0–17.0)
Lymphocytes Relative: 37 % (ref 12.0–46.0)
Lymphs Abs: 2.9 10*3/uL (ref 0.7–4.0)
MCHC: 33.7 g/dL (ref 30.0–36.0)
MCV: 92.4 fl (ref 78.0–100.0)
Monocytes Absolute: 0.5 10*3/uL (ref 0.1–1.0)
Monocytes Relative: 6.3 % (ref 3.0–12.0)
Neutro Abs: 4.2 10*3/uL (ref 1.4–7.7)
Neutrophils Relative %: 53.1 % (ref 43.0–77.0)
Platelets: 249 10*3/uL (ref 150.0–400.0)
RBC: 4.58 Mil/uL (ref 4.22–5.81)
RDW: 12.6 % (ref 11.5–15.5)
WBC: 7.9 10*3/uL (ref 4.0–10.5)

## 2019-12-17 LAB — LIPID PANEL
Cholesterol: 133 mg/dL (ref 0–200)
HDL: 21.1 mg/dL — ABNORMAL LOW (ref >39.00)
LDL Cholesterol: 80 mg/dL (ref 0–99)
NonHDL: 111.7
Total CHOL/HDL Ratio: 6
Triglycerides: 160 mg/dL — ABNORMAL HIGH (ref 0.0–149.0)
VLDL: 32 mg/dL (ref 0.0–40.0)

## 2019-12-17 LAB — PSA: PSA: 0.37 ng/mL (ref 0.10–4.00)

## 2019-12-17 LAB — BASIC METABOLIC PANEL
BUN: 16 mg/dL (ref 6–23)
CO2: 27 mEq/L (ref 19–32)
Calcium: 9.9 mg/dL (ref 8.4–10.5)
Chloride: 103 mEq/L (ref 96–112)
Creatinine, Ser: 1.02 mg/dL (ref 0.40–1.50)
GFR: 78.12 mL/min (ref >60.00)
Glucose, Bld: 94 mg/dL (ref 70–99)
Potassium: 4.5 mEq/L (ref 3.5–5.1)
Sodium: 137 mEq/L (ref 135–145)

## 2019-12-17 LAB — HEPATIC FUNCTION PANEL
ALT: 28 U/L (ref 0–53)
AST: 20 U/L (ref 0–37)
Albumin: 4.8 g/dL (ref 3.5–5.2)
Alkaline Phosphatase: 38 U/L — ABNORMAL LOW (ref 39–117)
Bilirubin, Direct: 0.1 mg/dL (ref 0.0–0.3)
Total Bilirubin: 0.9 mg/dL (ref 0.2–1.2)
Total Protein: 7.3 g/dL (ref 6.0–8.3)

## 2019-12-17 LAB — TSH: TSH: 1.75 u[IU]/mL (ref 0.35–4.50)

## 2019-12-17 NOTE — Patient Instructions (Signed)
Follow up in 6 months to recheck BP and cholesterol We'll notify you of your lab results and make any changes if needed Continue to work on healthy diet and regular exercise- you can do it! PLEASE get your COVID vaccines.  It can save your life! Call with any questions or concern Stay Safe! Stay Healthy!

## 2019-12-17 NOTE — Assessment & Plan Note (Signed)
Pt's PE WNL w/ exception of obesity.  UTD on Tdap.  Flu shot today.  Despite extensive discussion pt is still unsure of getting COVID vaccine.  Stressed that this would likely save his life should he get COVID.  Pt states he will think about it.  Check labs.  Anticipatory guidance provided.

## 2019-12-17 NOTE — Progress Notes (Signed)
   Subjective:    Patient ID: [name], male    DOB: October 21, 1970, 49 y.o.   MRN: [id number]  HPI CPE- UTD on Tdap.  Due for flu.  Needs COVID vaccine- is uncertain.  No concerns today  Reviewed past medical, surgical, family and social histories.   Health Maintenance  Topic Date Due  . COVID-19 Vaccine (1) Never done  . INFLUENZA VACCINE  10/19/2019  . HIV Screening  12/16/2020 (Originally 06/17/1987)  . TETANUS/TDAP  10/31/2028  . Hepatitis C Screening  Completed      Review of Systems Patient reports no vision/hearing changes, anorexia, fever ,adenopathy, persistant/recurrent hoarseness, swallowing issues, chest pain, palpitations, edema, persistant/recurrent cough, hemoptysis, dyspnea (rest,exertional, paroxysmal nocturnal), gastrointestinal  bleeding (melena, rectal bleeding), abdominal pain, excessive heart burn, GU symptoms (dysuria, hematuria, voiding/incontinence issues) syncope, focal weakness, memory loss, numbness & tingling, skin/hair/nail changes, depression, anxiety, abnormal bruising/bleeding, musculoskeletal symptoms/signs.   This visit occurred during the SARS-CoV-2 public health emergency.  Safety protocols were in place, including screening questions prior to the visit, additional usage of staff PPE, and extensive cleaning of exam room while observing appropriate contact time as indicated for disinfecting solutions.       Objective:   Physical Exam General Appearance:    Alert, cooperative, no distress, appears stated age, obese  Head:    Normocephalic, without obvious abnormality, atraumatic  Eyes:    PERRL, conjunctiva/corneas clear, EOM's intact, fundi    benign, both eyes       Ears:    Normal TM's and external ear canals, both ears  Nose:   Deferred due to COVID  Throat:   Neck:   Supple, symmetrical, trachea midline, no adenopathy;       thyroid:  No enlargement/tenderness/nodules  Back:     Symmetric, no curvature, ROM normal, no CVA tenderness    Lungs:     Clear to auscultation bilaterally, respirations unlabored  Chest wall:    No tenderness or deformity  Heart:    Regular rate and rhythm, S1 and S2 normal, no murmur, rub   or gallop  Abdomen:     Soft, non-tender, bowel sounds active all four quadrants,    no masses, no organomegaly  Genitalia:    deferred  Rectal:    Extremities:   Extremities normal, atraumatic, no cyanosis or edema  Pulses:   2+ and symmetric all extremities  Skin:   Skin color, texture, turgor normal, no rashes or lesions  Lymph nodes:   Cervical, supraclavicular, and axillary nodes normal  Neurologic:   CNII-XII intact. Normal strength, sensation and reflexes      throughout          Assessment & Plan:

## 2019-12-17 NOTE — Addendum Note (Signed)
Addended by: Lana Fish on: 12/17/2019 10:45 AM   Modules accepted: Orders

## 2019-12-18 ENCOUNTER — Encounter: Payer: Self-pay | Admitting: General Practice

## 2019-12-23 ENCOUNTER — Other Ambulatory Visit (HOSPITAL_COMMUNITY): Payer: Self-pay | Admitting: Internal Medicine

## 2019-12-23 MED FILL — ATORVASTATIN 20 MG TABLET: 20 | 60 days supply | Qty: 60 | Fill #2

## 2019-12-23 MED FILL — ENTRESTO 97 MG-103 MG TAB: 97-103 | 30 days supply | Qty: 60 | Fill #1

## 2019-12-25 ENCOUNTER — Other Ambulatory Visit (HOSPITAL_COMMUNITY): Payer: Self-pay | Admitting: Internal Medicine

## 2019-12-25 MED FILL — SPIRONOLACTONE 25 MG TABS: 25 | 90 days supply | Qty: 90 | Fill #0

## 2020-01-23 ENCOUNTER — Encounter: Payer: Self-pay | Admitting: Family Medicine

## 2020-01-23 ENCOUNTER — Telehealth (INDEPENDENT_AMBULATORY_CARE_PROVIDER_SITE_OTHER): Payer: BLUE CROSS/BLUE SHIELD | Admitting: Family Medicine

## 2020-01-23 ENCOUNTER — Other Ambulatory Visit: Payer: Self-pay | Admitting: Family Medicine

## 2020-01-23 DIAGNOSIS — J40 Bronchitis, not specified as acute or chronic: Secondary | ICD-10-CM | POA: Diagnosis not present

## 2020-01-23 MED ORDER — AZITHROMYCIN 250 MG PO TABS: ORAL_TABLET | ORAL | 0 refills | Status: DC

## 2020-01-23 MED FILL — AZITHROMYCIN 250 MG TABS: 250 | 5 days supply | Qty: 6 | Fill #0

## 2020-01-23 NOTE — Progress Notes (Signed)
   Subjective:    Patient ID: [name], male    DOB: [birth year], 49 y.o.   MRN: [id number]  HPI Virtual Visit via Telephone Note  I connected with the patient on 01/23/20 at 11:00 AM EDT by telephone and verified that I am speaking with the correct person using two identifiers.   I discussed the limitations, risks, security and privacy concerns of performing an evaluation and management service by telephone and the availability of in person appointments. I also discussed with the patient that there may be a patient responsible charge related to this service. The patient expressed understanding and agreed to proceed.  Location patient: home Location provider: work or home office Participants present for the call: patient, provider Patient did not have a visit in the prior 7 days to address this/these issue(s).   History of Present Illness: Here for one week of chest congestion and coughing up clear sputum. No fever or chest pain or SOB. No headache or ST. No body aches or SOB. Using Delsym.    Observations/Objective: Patient sounds cheerful and well on the phone. I do not appreciate any SOB. Speech and thought processing are grossly intact. Patient reported vitals:  Assessment and Plan: Bronchitis, treat with a Zpack. I asked him to get tested for the Covid-19 virus today and he said he would.  Gershon Crane, MD   Follow Up Instructions:     [phone] [phone] 11-20 [phone] 21-30 I did not refer this patient for an OV in the next 24 hours for this/these issue(s).  I discussed the assessment and treatment plan with the patient. The patient was provided an opportunity to ask questions and all were answered. The patient agreed with the plan and demonstrated an understanding of the instructions.   The patient was advised to call back or seek an in-person evaluation if the symptoms worsen or if the condition fails to improve as anticipated.  I provided 12 minutes of  non-face-to-face time during this encounter.   Gershon Crane, MD    Review of Systems     Objective:   Physical Exam        Assessment & Plan:

## 2020-01-27 MED FILL — ENTRESTO 97 MG-103 MG TAB: 97-103 | 30 days supply | Qty: 60 | Fill #2

## 2020-02-03 ENCOUNTER — Telehealth (HOSPITAL_COMMUNITY): Payer: Self-pay | Admitting: *Deleted

## 2020-02-03 NOTE — Telephone Encounter (Signed)
Pt left vm stating he had a few questions for Dr.Bensimhon and would like a call back directly from provider.

## 2020-02-05 ENCOUNTER — Telehealth (HOSPITAL_COMMUNITY): Payer: Self-pay | Admitting: Internal Medicine

## 2020-02-05 NOTE — Telephone Encounter (Signed)
Pt left VM requesting phone call directly from Dr.Bensimhon.  Routed to Dr.Bensimhon

## 2020-02-05 NOTE — Telephone Encounter (Signed)
I called him.

## 2020-02-05 NOTE — Telephone Encounter (Signed)
Pt stated he called Tuesday left message for a medication consult, please call patient @[phone]

## 2020-02-18 MED FILL — FENOFIBRATE 160 MG TABLET: 160 | 90 days supply | Qty: 90 | Fill #1

## 2020-02-26 MED FILL — ENTRESTO 97 MG-103 MG TAB: 97-103 | 30 days supply | Qty: 60 | Fill #3

## 2020-03-16 MED FILL — SPIRONOLACTONE 25 MG TABS: 25 | 90 days supply | Qty: 90 | Fill #1

## 2020-03-16 MED FILL — ATORVASTATIN CALCIUM 20 MG: 20 | 60 days supply | Qty: 60 | Fill #3

## 2020-03-16 MED FILL — CARVEDILOL 25 MG TABLET: 25 | 90 days supply | Qty: 180 | Fill #0

## 2020-04-08 MED FILL — ENTRESTO 97 MG-103 MG TAB: 97-103 | 30 days supply | Qty: 60 | Fill #4

## 2020-04-21 ENCOUNTER — Telehealth (INDEPENDENT_AMBULATORY_CARE_PROVIDER_SITE_OTHER): Payer: BLUE CROSS/BLUE SHIELD | Admitting: Family

## 2020-04-21 ENCOUNTER — Other Ambulatory Visit: Payer: Self-pay | Admitting: Family

## 2020-04-21 DIAGNOSIS — U071 COVID-19: Secondary | ICD-10-CM | POA: Diagnosis not present

## 2020-04-21 DIAGNOSIS — J209 Acute bronchitis, unspecified: Secondary | ICD-10-CM

## 2020-04-21 MED ORDER — BENZONATATE 100 MG PO CAPS: 100.0000 mg | ORAL_CAPSULE | Freq: Three times a day (TID) | ORAL | 0 refills | Status: DC | PRN

## 2020-04-21 MED ORDER — AZITHROMYCIN 250 MG PO TABS: ORAL_TABLET | ORAL | 0 refills | Status: DC

## 2020-04-21 MED ORDER — PREDNISONE 20 MG PO TABS: 20.0000 mg | ORAL_TABLET | Freq: Every day | ORAL | 0 refills | Status: DC

## 2020-04-21 MED FILL — BENZONATATE 100 MG CAPS: 100 | 7 days supply | Qty: 20 | Fill #0

## 2020-04-21 MED FILL — predniSONE 20 MG TABS: 20 | 5 days supply | Qty: 5 | Fill #0

## 2020-04-21 MED FILL — AZITHROMYCIN 250 MG TABLET: 250 | 5 days supply | Qty: 6 | Fill #0

## 2020-04-21 NOTE — Progress Notes (Signed)
[name] is a 50 y.o. male with the following history as recorded in Epic[employer]:  Patient Active Problem List   Diagnosis Date Noted  . Obesity (BMI 30-39.9) 04/16/2018  . HTN (hypertension) 10/04/2015  . Physical exam 03/11/2014  . Chronic systolic heart failure (HCC) 06/16/2013  . Anxiety state, unspecified 05/23/2013  . DERMATOPHYTOSIS OF FOOT 09/27/2009  . Hyperlipidemia 04/06/2009    Current Outpatient Medications  Medication Sig Dispense Refill  . aspirin EC 81 MG tablet Take 81 mg by mouth daily.    Marland Kitchen atorvastatin (LIPITOR) 20 MG tablet TAKE 1 TABLET BY MOUTH ONCE DAILY 60 tablet 6  . azithromycin (ZITHROMAX) 250 MG tablet 2 tabs po qd x 1 day; 1 tablet per day x 4 days; 6 tablet 0  . benzonatate (TESSALON) 100 MG capsule Take 1 capsule (100 mg total) by mouth 3 (three) times daily as needed. 20 capsule 0  . carvedilol (COREG) 25 MG tablet Take 1 tablet (25 mg total) by mouth 2 (two) times daily with a meal. 180 tablet 3  . ENTRESTO 97-103 MG TAKE 1 TABLET BY MOUTH 2 (TWO) TIMES DAILY. NEEDS APPT FOR FURTHER REFILLS 60 tablet 11  . fenofibrate 160 MG tablet Take 1 tablet (160 mg total) by mouth daily. 90 tablet 3  . predniSONE (DELTASONE) 20 MG tablet Take 1 tablet (20 mg total) by mouth daily with breakfast. 5 tablet 0  . spironolactone (ALDACTONE) 25 MG tablet TAKE 1 TABLET BY MOUTH ONCE DAILY. 90 tablet 3  . Omega-3 Fatty Acids (FISH OIL PO) Take by mouth.  (Patient not taking: No sig reported)     No current facility-administered medications for this visit.    Allergies: Patient has no known allergies.  Past Medical History:  Diagnosis Date  . Athlete's foot   . CHF (congestive heart failure) (HCC)    a) NICM b) ECHO (05/2013) EF 10%, diff HK, large mural thrombus, mild MR, LA sev. dilated, RA mildly dilated, mod TR c) Swan GC (06/02/13): CVP 19, PA 41/25 (33), PCWP 29, Thermo CO/CI 2.4 / 1.1 d) cMRI EF 31%, no definite myocardial delayed enhancement   .  Hypertension   . NICM (nonischemic cardiomyopathy) (HCC)    a) LHC (06/04/2013): normal coronary arteries    Past Surgical History:  Procedure Laterality Date  . CORONARY ANGIOGRAM  06/04/2013   Procedure: CORONARY ANGIOGRAM;  Surgeon: Dolores Patty, MD;  Location: [facility] CATH LAB;  Service: Cardiovascular;;    Family History  Problem Relation Age of Onset  . Hypertension Mother   . Diabetes Mother   . Hypertension Maternal Grandmother   . Diabetes Maternal Grandmother   . Heart disease Maternal Grandmother   . Vision loss Paternal Grandmother   . Emphysema Paternal Grandfather     Social History   Tobacco Use  . Smoking status: Never Smoker  . Smokeless tobacco: Never Used  Substance Use Topics  . Alcohol use: No    Comment: No alcohol since 05/2013    Subjective:    I connected with [name] on 04/21/20 at 12:20 PM EST by a video enabled telemedicine application and verified that I am speaking with the correct person using two identifiers.   I discussed the limitations of evaluation and management by telemedicine and the availability of in person appointments. The patient expressed understanding and agreed to proceed. Video failed on call- change to telephone call;   Tested + for COVID on 04/16/20; history of chronic heart failure;  worried about secondary infection; + sore throat/ drainage; coughing up colored mucus; no fever;   Objective:  There were no vitals filed for this visit.  General: Well developed, well nourished, in no acute distress  Lungs: Respirations unlabored;  Neurologic: Alert and oriented; speech intact; face symmetrical; moves all extremities well; CNII-XII intact without focal deficit  Assessment:  1. COVID-19   2. Acute bronchitis, unspecified organism     Plan:  Due to history of heart failure, would be concerned for developing infection/ pneumonia from COVID; start prednisone, Z-pak and Tessalon perles; will need to consider CXR if  symptoms persist; increase fluids, rest and follow-up worse, no better.   Time spent 11 minutes  No follow-ups on file.  No orders of the defined types were placed in this encounter.   Requested Prescriptions   Signed Prescriptions Disp Refills  . predniSONE (DELTASONE) 20 MG tablet 5 tablet 0    Sig: Take 1 tablet (20 mg total) by mouth daily with breakfast.  . benzonatate (TESSALON) 100 MG capsule 20 capsule 0    Sig: Take 1 capsule (100 mg total) by mouth 3 (three) times daily as needed.  Marland Kitchen azithromycin (ZITHROMAX) 250 MG tablet 6 tablet 0    Sig: 2 tabs po qd x 1 day; 1 tablet per day x 4 days;

## 2020-05-14 MED FILL — ENTRESTO 97 MG-103 MG TAB: 97-103 | 30 days supply | Qty: 60 | Fill #5

## 2020-06-15 ENCOUNTER — Other Ambulatory Visit: Payer: Self-pay

## 2020-06-15 ENCOUNTER — Encounter: Payer: Self-pay | Admitting: Family Medicine

## 2020-06-15 ENCOUNTER — Ambulatory Visit (INDEPENDENT_AMBULATORY_CARE_PROVIDER_SITE_OTHER): Payer: BLUE CROSS/BLUE SHIELD | Admitting: Family Medicine

## 2020-06-15 VITALS — BP 122/78 | HR 55 | Temp 97.4°F | Resp 20 | Ht 72.0 in | Wt 236.2 lb

## 2020-06-15 DIAGNOSIS — I10 Essential (primary) hypertension: Secondary | ICD-10-CM

## 2020-06-15 DIAGNOSIS — E785 Hyperlipidemia, unspecified: Secondary | ICD-10-CM

## 2020-06-15 DIAGNOSIS — E669 Obesity, unspecified: Secondary | ICD-10-CM

## 2020-06-15 LAB — CBC WITH DIFFERENTIAL/PLATELET
Basophils Absolute: 0 10*3/uL (ref 0.0–0.1)
Basophils Relative: 0.4 % (ref 0.0–3.0)
Eosinophils Absolute: 0.1 10*3/uL (ref 0.0–0.7)
Eosinophils Relative: 1.7 % (ref 0.0–5.0)
HCT: 41.3 % (ref 39.0–52.0)
Hemoglobin: 14.3 g/dL (ref 13.0–17.0)
Lymphocytes Relative: 35.8 % (ref 12.0–46.0)
Lymphs Abs: 2.4 10*3/uL (ref 0.7–4.0)
MCHC: 34.6 g/dL (ref 30.0–36.0)
MCV: 90.7 fl (ref 78.0–100.0)
Monocytes Absolute: 0.4 10*3/uL (ref 0.1–1.0)
Monocytes Relative: 6.7 % (ref 3.0–12.0)
Neutro Abs: 3.7 10*3/uL (ref 1.4–7.7)
Neutrophils Relative %: 55.4 % (ref 43.0–77.0)
Platelets: 217 10*3/uL (ref 150.0–400.0)
RBC: 4.56 Mil/uL (ref 4.22–5.81)
RDW: 13.2 % (ref 11.5–15.5)
WBC: 6.6 10*3/uL (ref 4.0–10.5)

## 2020-06-15 LAB — BASIC METABOLIC PANEL
BUN: 14 mg/dL (ref 6–23)
CO2: 25 mEq/L (ref 19–32)
Calcium: 9.4 mg/dL (ref 8.4–10.5)
Chloride: 106 mEq/L (ref 96–112)
Creatinine, Ser: 1.03 mg/dL (ref 0.40–1.50)
GFR: 86.21 mL/min (ref >60.00)
Glucose, Bld: 98 mg/dL (ref 70–99)
Potassium: 4.1 mEq/L (ref 3.5–5.1)
Sodium: 139 mEq/L (ref 135–145)

## 2020-06-15 LAB — HEPATIC FUNCTION PANEL
ALT: 26 U/L (ref 0–53)
AST: 20 U/L (ref 0–37)
Albumin: 4.4 g/dL (ref 3.5–5.2)
Alkaline Phosphatase: 33 U/L — ABNORMAL LOW (ref 39–117)
Bilirubin, Direct: 0.1 mg/dL (ref 0.0–0.3)
Total Bilirubin: 0.6 mg/dL (ref 0.2–1.2)
Total Protein: 7.1 g/dL (ref 6.0–8.3)

## 2020-06-15 LAB — LIPID PANEL
Cholesterol: 109 mg/dL (ref 0–200)
HDL: 22 mg/dL — ABNORMAL LOW (ref >39.00)
LDL Cholesterol: 62 mg/dL (ref 0–99)
NonHDL: 87.35
Total CHOL/HDL Ratio: 5
Triglycerides: 128 mg/dL (ref 0.0–149.0)
VLDL: 25.6 mg/dL (ref 0.0–40.0)

## 2020-06-15 LAB — TSH: TSH: 1.87 u[IU]/mL (ref 0.35–4.50)

## 2020-06-15 MED FILL — ENTRESTO 97 MG-103 MG TAB: 97-103 | 30 days supply | Qty: 60 | Fill #6

## 2020-06-15 MED FILL — CARVEDILOL 25 MG TABLET: 25 | 90 days supply | Qty: 180 | Fill #1

## 2020-06-15 MED FILL — SPIRONOLACTONE 25 MG TABLET: 25 | 90 days supply | Qty: 90 | Fill #2

## 2020-06-15 NOTE — Assessment & Plan Note (Signed)
Chronic problem.  Well controlled on Coreg, Entresto, and Spironolactone.  Currently asymptomatic.  Check labs.  No anticipated med changes.

## 2020-06-15 NOTE — Progress Notes (Signed)
   Subjective:    Patient ID: [name], male    DOB: [birth year], 50 y.o.   MRN: [id number]  HPI HTN- chronic problem, on Coreg 25mg  BID, Entresto 97/103mg  BID, and Spironolactone 25mg  daily w/ good control.  Denies CP, SOB, HAs, visual changes, edema.  Hyperlipidemia- chronic problem, on Lipitor 20mg  daily.  No abd pain, N/V.  Obesity- pt has gained 7 lbs since last visit.  BMI now 32.03.  Job is fairly active, plays w/ son when he gets home from work.  Rarely sitting.   Review of Systems For ROS see HPI   This visit occurred during the SARS-CoV-2 public health emergency.  Safety protocols were in place, including screening questions prior to the visit, additional usage of staff PPE, and extensive cleaning of exam room while observing appropriate contact time as indicated for disinfecting solutions.       Objective:   Physical Exam Vitals reviewed.  Constitutional:      General: He is not in acute distress.    Appearance: Normal appearance. He is well-developed. He is obese.  HENT:     Head: Normocephalic and atraumatic.  Eyes:     Conjunctiva/sclera: Conjunctivae normal.     Pupils: Pupils are equal, round, and reactive to light.  Neck:     Thyroid: No thyromegaly.  Cardiovascular:     Rate and Rhythm: Normal rate and regular rhythm.     Heart sounds: Normal heart sounds. No murmur heard.   Pulmonary:     Effort: Pulmonary effort is normal. No respiratory distress.     Breath sounds: Normal breath sounds.  Abdominal:     General: Bowel sounds are normal. There is no distension.     Palpations: Abdomen is soft.  Musculoskeletal:     Cervical back: Normal range of motion and neck supple.     Right lower leg: No edema.     Left lower leg: No edema.  Lymphadenopathy:     Cervical: No cervical adenopathy.  Skin:    General: Skin is warm and dry.  Neurological:     Mental Status: He is alert and oriented to person, place, and time.     Cranial Nerves: No  cranial nerve deficit.  Psychiatric:        Behavior: Behavior normal.           Assessment & Plan:

## 2020-06-15 NOTE — Patient Instructions (Signed)
Schedule your complete physical in 6 months ?We'll notify you of your lab results and make any changes if needed ?Continue to work on healthy diet and regular exercise- you can do it! ?Call with any questions or concerns ?Stay Safe!  Stay Healthy! ?Happy Birthday!!! ?

## 2020-06-15 NOTE — Assessment & Plan Note (Signed)
Deteriorated.  Pt has gained 7 lbs since last visit.  Discussed need for healthy diet and regular exercise.  Will continue to follow.

## 2020-06-15 NOTE — Assessment & Plan Note (Signed)
Chronic problem.  Tolerating Lipitor w/o difficulty.  Check labs.  Adjust meds prn  

## 2020-07-16 ENCOUNTER — Other Ambulatory Visit (HOSPITAL_COMMUNITY): Payer: Self-pay

## 2020-07-16 MED FILL — Sacubitril-Valsartan Tab 97-103 MG: ORAL | 30 days supply | Qty: 60 | Fill #0 | Status: AC

## 2020-08-07 ENCOUNTER — Other Ambulatory Visit (HOSPITAL_COMMUNITY): Payer: Self-pay

## 2020-08-07 MED FILL — Atorvastatin Calcium Tab 20 MG (Base Equivalent): ORAL | 60 days supply | Qty: 60 | Fill #0 | Status: AC

## 2020-08-09 ENCOUNTER — Other Ambulatory Visit (HOSPITAL_COMMUNITY): Payer: Self-pay

## 2020-08-10 ENCOUNTER — Other Ambulatory Visit (HOSPITAL_COMMUNITY): Payer: Self-pay

## 2020-08-23 MED FILL — Sacubitril-Valsartan Tab 97-103 MG: ORAL | 30 days supply | Qty: 60 | Fill #1 | Status: AC

## 2020-08-24 ENCOUNTER — Other Ambulatory Visit (HOSPITAL_COMMUNITY): Payer: Self-pay

## 2020-09-14 MED FILL — Fenofibrate Tab 160 MG: ORAL | 90 days supply | Qty: 90 | Fill #0 | Status: AC

## 2020-09-15 ENCOUNTER — Encounter: Payer: Self-pay | Admitting: *Deleted

## 2020-09-15 ENCOUNTER — Other Ambulatory Visit (HOSPITAL_COMMUNITY): Payer: Self-pay

## 2020-09-24 ENCOUNTER — Other Ambulatory Visit (HOSPITAL_COMMUNITY): Payer: Self-pay

## 2020-09-24 MED FILL — Sacubitril-Valsartan Tab 97-103 MG: ORAL | 30 days supply | Qty: 60 | Fill #2 | Status: AC

## 2020-09-24 MED FILL — Carvedilol Tab 25 MG: ORAL | 90 days supply | Qty: 180 | Fill #0 | Status: AC

## 2020-10-25 ENCOUNTER — Other Ambulatory Visit: Payer: Self-pay | Admitting: Family Medicine

## 2020-10-25 ENCOUNTER — Other Ambulatory Visit (HOSPITAL_COMMUNITY): Payer: Self-pay

## 2020-10-25 MED ORDER — ATORVASTATIN CALCIUM 20 MG PO TABS
20.0000 mg | ORAL_TABLET | Freq: Every day | ORAL | 0 refills | Status: DC
  Filled 2020-10-25: qty 90, 90d supply, fill #0

## 2020-11-08 ENCOUNTER — Other Ambulatory Visit (HOSPITAL_COMMUNITY): Payer: Self-pay

## 2020-11-08 MED FILL — Sacubitril-Valsartan Tab 97-103 MG: ORAL | 30 days supply | Qty: 60 | Fill #3 | Status: AC

## 2020-11-15 ENCOUNTER — Other Ambulatory Visit (HOSPITAL_COMMUNITY): Payer: Self-pay

## 2020-11-15 MED FILL — Spironolactone Tab 25 MG: ORAL | 90 days supply | Qty: 90 | Fill #0 | Status: AC

## 2020-12-12 ENCOUNTER — Other Ambulatory Visit (HOSPITAL_COMMUNITY): Payer: Self-pay | Admitting: Internal Medicine

## 2020-12-13 ENCOUNTER — Other Ambulatory Visit (HOSPITAL_COMMUNITY): Payer: Self-pay

## 2020-12-13 DIAGNOSIS — L814 Other melanin hyperpigmentation: Secondary | ICD-10-CM | POA: Diagnosis not present

## 2020-12-13 DIAGNOSIS — D225 Melanocytic nevi of trunk: Secondary | ICD-10-CM | POA: Diagnosis not present

## 2020-12-13 DIAGNOSIS — L570 Actinic keratosis: Secondary | ICD-10-CM | POA: Diagnosis not present

## 2020-12-13 MED ORDER — FLUOROURACIL 5 % EX CREA
TOPICAL_CREAM | CUTANEOUS | 0 refills | Status: DC
  Filled 2020-12-13: qty 40, 14d supply, fill #0

## 2020-12-14 ENCOUNTER — Other Ambulatory Visit (HOSPITAL_COMMUNITY): Payer: Self-pay

## 2020-12-14 ENCOUNTER — Other Ambulatory Visit (HOSPITAL_COMMUNITY): Payer: Self-pay | Admitting: Internal Medicine

## 2020-12-14 MED FILL — Sacubitril-Valsartan Tab 97-103 MG: ORAL | 30 days supply | Qty: 60 | Fill #0 | Status: AC

## 2020-12-16 ENCOUNTER — Ambulatory Visit (INDEPENDENT_AMBULATORY_CARE_PROVIDER_SITE_OTHER): Payer: BC Managed Care – PPO | Admitting: Family Medicine

## 2020-12-16 ENCOUNTER — Encounter: Payer: Self-pay | Admitting: Family Medicine

## 2020-12-16 ENCOUNTER — Other Ambulatory Visit: Payer: Self-pay

## 2020-12-16 VITALS — BP 116/70 | HR 63 | Temp 98.1°F | Resp 16 | Ht 72.0 in | Wt 233.0 lb

## 2020-12-16 DIAGNOSIS — Z0000 Encounter for general adult medical examination without abnormal findings: Secondary | ICD-10-CM | POA: Diagnosis not present

## 2020-12-16 DIAGNOSIS — Z23 Encounter for immunization: Secondary | ICD-10-CM | POA: Diagnosis not present

## 2020-12-16 DIAGNOSIS — I5022 Chronic systolic (congestive) heart failure: Secondary | ICD-10-CM

## 2020-12-16 DIAGNOSIS — Z125 Encounter for screening for malignant neoplasm of prostate: Secondary | ICD-10-CM | POA: Diagnosis not present

## 2020-12-16 DIAGNOSIS — Z1211 Encounter for screening for malignant neoplasm of colon: Secondary | ICD-10-CM

## 2020-12-16 LAB — TSH: TSH: 3.23 u[IU]/mL (ref 0.35–5.50)

## 2020-12-16 LAB — CBC WITH DIFFERENTIAL/PLATELET
Basophils Absolute: 0 10*3/uL (ref 0.0–0.1)
Basophils Relative: 0.6 % (ref 0.0–3.0)
Eosinophils Absolute: 0.2 10*3/uL (ref 0.0–0.7)
Eosinophils Relative: 2.3 % (ref 0.0–5.0)
HCT: 43.9 % (ref 39.0–52.0)
Hemoglobin: 14.8 g/dL (ref 13.0–17.0)
Lymphocytes Relative: 33.9 % (ref 12.0–46.0)
Lymphs Abs: 2.4 10*3/uL (ref 0.7–4.0)
MCHC: 33.7 g/dL (ref 30.0–36.0)
MCV: 92.7 fl (ref 78.0–100.0)
Monocytes Absolute: 0.5 10*3/uL (ref 0.1–1.0)
Monocytes Relative: 7 % (ref 3.0–12.0)
Neutro Abs: 4 10*3/uL (ref 1.4–7.7)
Neutrophils Relative %: 56.2 % (ref 43.0–77.0)
Platelets: 240 10*3/uL (ref 150.0–400.0)
RBC: 4.74 Mil/uL (ref 4.22–5.81)
RDW: 12.5 % (ref 11.5–15.5)
WBC: 7.1 10*3/uL (ref 4.0–10.5)

## 2020-12-16 LAB — HEPATIC FUNCTION PANEL
ALT: 29 U/L (ref 0–53)
AST: 20 U/L (ref 0–37)
Albumin: 4.8 g/dL (ref 3.5–5.2)
Alkaline Phosphatase: 35 U/L — ABNORMAL LOW (ref 39–117)
Bilirubin, Direct: 0.1 mg/dL (ref 0.0–0.3)
Total Bilirubin: 0.7 mg/dL (ref 0.2–1.2)
Total Protein: 7.3 g/dL (ref 6.0–8.3)

## 2020-12-16 LAB — LIPID PANEL
Cholesterol: 145 mg/dL (ref 0–200)
HDL: 25.1 mg/dL — ABNORMAL LOW (ref >39.00)
NonHDL: 119.86
Total CHOL/HDL Ratio: 6
Triglycerides: 219 mg/dL — ABNORMAL HIGH (ref 0.0–149.0)
VLDL: 43.8 mg/dL — ABNORMAL HIGH (ref 0.0–40.0)

## 2020-12-16 LAB — PSA: PSA: 0.43 ng/mL (ref 0.10–4.00)

## 2020-12-16 LAB — BASIC METABOLIC PANEL
BUN: 17 mg/dL (ref 6–23)
CO2: 25 mEq/L (ref 19–32)
Calcium: 9.7 mg/dL (ref 8.4–10.5)
Chloride: 103 mEq/L (ref 96–112)
Creatinine, Ser: 0.96 mg/dL (ref 0.40–1.50)
GFR: 93.47 mL/min (ref >60.00)
Glucose, Bld: 84 mg/dL (ref 70–99)
Potassium: 4.2 mEq/L (ref 3.5–5.1)
Sodium: 137 mEq/L (ref 135–145)

## 2020-12-16 LAB — LDL CHOLESTEROL, DIRECT: Direct LDL: 90 mg/dL

## 2020-12-16 NOTE — Assessment & Plan Note (Signed)
Pt's PE WNL w/ exception of BMI.  UTD on Tdap.  Flu shot given today.  GI referral for colonoscopy placed.  Check labs.  Anticipatory guidance provided.

## 2020-12-16 NOTE — Progress Notes (Signed)
   Subjective:    Patient ID: [name], male    DOB: 07/16/1970, 50 y.o.   MRN: [id number]  HPI CPE- Due for colonoscopy, flu shot.  No concerns  Health Maintenance  Topic Date Due   INFLUENZA VACCINE  10/18/2020   HIV Screening  12/16/2020 (Originally 06/17/1987)   COVID-19 Vaccine (2 - Booster for Janssen series) 01/01/2021 (Originally 04/20/2020)   COLONOSCOPY (Pts 45-11yrs Insurance coverage will need to be confirmed)  06/20/2021 (Originally 06/16/2017)   TETANUS/TDAP  10/31/2028   Hepatitis C Screening  Completed   HPV VACCINES  Aged Out      Review of Systems Patient reports no vision/hearing changes, anorexia, fever ,adenopathy, persistant/recurrent hoarseness, swallowing issues, chest pain, palpitations, edema, persistant/recurrent cough, hemoptysis, dyspnea (rest,exertional, paroxysmal nocturnal), gastrointestinal  bleeding (melena, rectal bleeding), abdominal pain, excessive heart burn, GU symptoms (dysuria, hematuria, voiding/incontinence issues) syncope, focal weakness, memory loss, numbness & tingling, skin/hair/nail changes, depression, anxiety, abnormal bruising/bleeding, musculoskeletal symptoms/signs.   This visit occurred during the SARS-CoV-2 public health emergency.  Safety protocols were in place, including screening questions prior to the visit, additional usage of staff PPE, and extensive cleaning of exam room while observing appropriate contact time as indicated for disinfecting solutions.      Objective:   Physical Exam General Appearance:    Alert, cooperative, no distress, appears stated age  Head:    Normocephalic, without obvious abnormality, atraumatic  Eyes:    PERRL, conjunctiva/corneas clear, EOM's intact, fundi    benign, both eyes       Ears:    Normal TM's and external ear canals, both ears  Nose:   Deferred due to COVID  Throat:   Neck:   Supple, symmetrical, trachea midline, no adenopathy;       thyroid:  No enlargement/tenderness/nodules   Back:     Symmetric, no curvature, ROM normal, no CVA tenderness  Lungs:     Clear to auscultation bilaterally, respirations unlabored  Chest wall:    No tenderness or deformity  Heart:    Regular rate and rhythm, S1 and S2 normal, no murmur, rub   or gallop  Abdomen:     Soft, non-tender, bowel sounds active all four quadrants,    no masses, no organomegaly  Genitalia:    deferred  Rectal:    Extremities:   Extremities normal, atraumatic, no cyanosis or edema  Pulses:   2+ and symmetric all extremities  Skin:   Skin color, texture, turgor normal, no rashes or lesions  Lymph nodes:   Cervical, supraclavicular, and axillary nodes normal  Neurologic:   CNII-XII intact. Normal strength, sensation and reflexes      throughout          Assessment & Plan:

## 2020-12-16 NOTE — Assessment & Plan Note (Signed)
Follows w/ Dr Gala Romney.  On Entresto, beta blocker, spironolactone, ASA, and statin.  Check labs.  Will assist as able.

## 2020-12-16 NOTE — Patient Instructions (Signed)
Follow up in 6 months to recheck BP and cholesterol We'll notify you of your lab results and make any changes if needed Continue to work on healthy diet and regular exercise- you can do it! Call with any questions or concerns Stay Safe!  Stay Healthy! Happy Fall!!! 

## 2021-01-17 ENCOUNTER — Other Ambulatory Visit (HOSPITAL_COMMUNITY): Payer: Self-pay

## 2021-01-17 ENCOUNTER — Other Ambulatory Visit (HOSPITAL_COMMUNITY): Payer: Self-pay | Admitting: Internal Medicine

## 2021-01-17 MED ORDER — FENOFIBRATE 160 MG PO TABS
160.0000 mg | ORAL_TABLET | Freq: Every day | ORAL | 3 refills | Status: DC
  Filled 2021-01-17: qty 90, 90d supply, fill #0
  Filled 2021-04-21: qty 90, 90d supply, fill #1
  Filled 2021-07-25: qty 90, 90d supply, fill #2
  Filled 2021-10-24: qty 90, 90d supply, fill #3

## 2021-01-17 MED FILL — Sacubitril-Valsartan Tab 97-103 MG: ORAL | 30 days supply | Qty: 60 | Fill #1 | Status: AC

## 2021-01-18 ENCOUNTER — Other Ambulatory Visit (HOSPITAL_COMMUNITY): Payer: Self-pay | Admitting: Internal Medicine

## 2021-01-21 ENCOUNTER — Other Ambulatory Visit (HOSPITAL_COMMUNITY): Payer: Self-pay

## 2021-01-21 MED ORDER — CARVEDILOL 25 MG PO TABS
25.0000 mg | ORAL_TABLET | Freq: Two times a day (BID) | ORAL | 0 refills | Status: DC
  Filled 2021-01-21: qty 180, 90d supply, fill #0

## 2021-02-01 ENCOUNTER — Ambulatory Visit (HOSPITAL_COMMUNITY)
Admission: RE | Admit: 2021-02-01 | Discharge: 2021-02-01 | Disposition: A | Discharge status: Home / Self Care | Payer: BC Managed Care – PPO | Source: Ambulatory Visit | Attending: Internal Medicine | Admitting: Internal Medicine

## 2021-02-01 ENCOUNTER — Other Ambulatory Visit (HOSPITAL_COMMUNITY): Payer: Self-pay | Admitting: Internal Medicine

## 2021-02-01 ENCOUNTER — Encounter (HOSPITAL_COMMUNITY): Payer: Self-pay | Admitting: Internal Medicine

## 2021-02-01 ENCOUNTER — Other Ambulatory Visit: Payer: Self-pay

## 2021-02-01 VITALS — BP 130/80 | HR 66 | Wt 236.6 lb

## 2021-02-01 DIAGNOSIS — R0683 Snoring: Secondary | ICD-10-CM | POA: Insufficient documentation

## 2021-02-01 DIAGNOSIS — R002 Palpitations: Secondary | ICD-10-CM

## 2021-02-01 DIAGNOSIS — E785 Hyperlipidemia, unspecified: Secondary | ICD-10-CM | POA: Insufficient documentation

## 2021-02-01 DIAGNOSIS — I110 Hypertensive heart disease with heart failure: Secondary | ICD-10-CM | POA: Insufficient documentation

## 2021-02-01 DIAGNOSIS — E669 Obesity, unspecified: Secondary | ICD-10-CM | POA: Diagnosis not present

## 2021-02-01 DIAGNOSIS — I472 Ventricular tachycardia, unspecified: Secondary | ICD-10-CM | POA: Diagnosis not present

## 2021-02-01 DIAGNOSIS — I493 Ventricular premature depolarization: Secondary | ICD-10-CM | POA: Diagnosis not present

## 2021-02-01 DIAGNOSIS — Z79899 Other long term (current) drug therapy: Secondary | ICD-10-CM | POA: Insufficient documentation

## 2021-02-01 DIAGNOSIS — G4733 Obstructive sleep apnea (adult) (pediatric): Secondary | ICD-10-CM | POA: Insufficient documentation

## 2021-02-01 DIAGNOSIS — I5022 Chronic systolic (congestive) heart failure: Secondary | ICD-10-CM

## 2021-02-01 DIAGNOSIS — I428 Other cardiomyopathies: Secondary | ICD-10-CM | POA: Diagnosis not present

## 2021-02-01 NOTE — Progress Notes (Signed)
Zio patch placed onto patient.  All instructions and information reviewed with patient, they verbalize understanding with no questions. 

## 2021-02-01 NOTE — Progress Notes (Signed)
ADVANCED HF CLINIC NOTE   Patient ID: [name], male   DOB: 07-Mar-1971, 50 y.o.   MRN: [id number] PCP: Dr Beverely Low Pulmonary: Dr Vassie Loll  HPI: [name] is a 50 y.o. male with a history of ETOH consumption, obesity, and newly diagnosed acute systolic heart failure 05/31/13.   Admitted to [facility] 05/31/13 with dyspnea and lower extremity edema which was from acute systolic heart failure. Echo LVEF 10% with global HK, large LV thrombus and severe RV dysfunction.  LHC 05/2013 normal cors. Required short course of Milrinone. He was discharged with lifevest.  Discharge weight was 191 pounds.   He presents for routine f/u. Feels great working FT here at [employer]. No CP, SOB, orthopnea or PND. Complaint with meds.   Studies:  Echo 2/20 EF 45-50%  Echo 7/18 EF 35-40%  ECHO 06/01/13 EF 10%  ECHO 09/03/13  EF 25-30% RV normal No LV thrombus cMRI 8/15 EF 31% no scar Bedside echo (11/11/13) EF 30-35% ECHO 11/15 EF 40%, diffuse hypokinesis ECHO 5/16 EF 40-45%, mild to moderately dilated LV, normal RV size and systolic function  CPX (2/16) with peak VO2 27.4, VE/VCO2 slope 27.4, RER 1.28 => low normal functional capacity.                                                                                                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 Prior heavy ETOH, now rare.   FH: Mom: HTN, DM       Grandmother: HTN, DM, heart disease   Review of systems complete and found to be negative unless listed in HPI.    Past Medical History:  Diagnosis Date   Athlete's foot    CHF (congestive heart failure) (HCC)    a) NICM b) ECHO (05/2013) EF 10%, diff HK, large mural thrombus, mild MR, LA sev. dilated, RA mildly dilated, mod TR c) Swan GC (06/02/13): CVP 19, PA 41/25 (33), PCWP 29, Thermo CO/CI 2.4 / 1.1 d) cMRI EF 31%, no definite myocardial delayed enhancement    Hypertension    NICM (nonischemic cardiomyopathy) (HCC)    a) LHC (06/04/2013): normal  coronary arteries    Current Outpatient Medications  Medication Sig Dispense Refill   aspirin EC 81 MG tablet Take 81 mg by mouth daily.     atorvastatin (LIPITOR) 20 MG tablet Take 1 tablet (20 mg total) by mouth daily. 90 tablet 0   carvedilol (COREG) 25 MG tablet Take 1 tablet (25 mg total) by mouth 2 (two) times daily with a meal. 180 tablet 0   fenofibrate 160 MG tablet Take 1 tablet (160 mg total) by mouth daily. 90 tablet 3   sacubitril-valsartan (ENTRESTO) 97-103 MG Take 1 tablet by mouth 2 (two) times daily. Please make follow up appointment for further refills. Call [phone] to schedule an appointment. 60 tablet 3   spironolactone (ALDACTONE) 25 MG tablet TAKE 1 TABLET BY MOUTH ONCE DAILY. 90 tablet 3   No current facility-administered medications for this encounter.   Vitals:   02/01/21 1448  BP: 130/80  Pulse: 66  SpO2: 98%  Weight: 107.3 kg (236 lb 9.6 oz)    Wt Readings from Last 3 Encounters:  02/01/21 107.3 kg (236 lb 9.6 oz)  12/16/20 105.7 kg (233 lb)  06/15/20 107.1 kg (236 lb 3.2 oz)    PHYSICAL EXAM: General:  Well appearing. No resp difficulty HEENT: normal Neck: supple. no JVD. Carotids 2+ bilat; no bruits. No lymphadenopathy or thryomegaly appreciated. Cor: PMI nondisplaced. Irregular No rubs, gallops or murmurs. Lungs: clear Abdomen: soft, nontender, nondistended. No hepatosplenomegaly. No bruits or masses. Good bowel sounds. Extremities: no cyanosis, clubbing, rash, edema Neuro: alert & orientedx3, cranial nerves grossly intact. moves all 4 extremities w/o difficulty. Affect pleasant  ECG: NSR 99 frequent PVCs with 2 morphologies. Personally reviewed  ASSESSMENT & PLAN:  1. Chronic Systolic Heart Failure: Nonischemic cardiomyopathy (ETOH may have played a role), echo with EF 10% in 3/15. ECHO 07/2014 40-45%.  - cMRI 10/2013 with LVEF 31%, Normal RV, No LGE.  - Echo 7/18 EF 35-40%  - Echo 2/20 EF 45-50% - NYHA I  - Volume looks good - Not on  diuretics - Continue spiro 25 mg daily - Continue coreg 25 mg BID - Continue Entresto to 97/103 mg BID.  - having frequent PVCs. Place Zio and check echo to assess EF stability 2. NSVT:  - Resolved. EF improved to out of ICD range.  3. LV thrombus:  - Resolved.  4. Mild OSA:  - Per Dr Elsworth Soho mild sleep apnea. No intervention needed at this time. Still snoring. If getting worse will arrange for home sleep study - particularly if PVC burden is high 5. Hyperlipidemia:  - Continue statin.   - PCP following lipids.  6. Frequent PVCs. - high burden on ECG today - asymptomatic - will need Zio to quantify - repeat echo to make sure EF not worse  Glori Bickers, MD  02/01/2021

## 2021-02-01 NOTE — Patient Instructions (Signed)
Your provider has recommended that  you wear a Zio Patch for 14 days.  This monitor will record your heart rhythm for our review.  IF you have any symptoms while wearing the monitor please press the button.  If you have any issues with the patch or you notice a red or orange light on it please call the company at [phone].  Once you remove the patch please mail it back to the company as soon as possible so we can get the results.  Your physician has requested that you have an echocardiogram. Echocardiography is a painless test that uses sound waves to create images of your heart. It provides your doctor with information about the size and shape of your heart and how well your heart's chambers and valves are working. This procedure takes approximately one hour. There are no restrictions for this procedure.  Your physician recommends that you schedule a follow-up appointment in: 1 year, **PLEASE CALL OUR OFFICE IN September TO SCHEDULE THIS  If you have any questions or concerns before your next appointment please send [place] a message through [place] or call our office at [phone].    TO LEAVE A MESSAGE FOR THE NURSE SELECT OPTION 2, PLEASE LEAVE A MESSAGE INCLUDING: YOUR NAME DATE OF BIRTH CALL BACK NUMBER REASON FOR CALL**this is important as we prioritize the call backs  YOU WILL RECEIVE A CALL BACK THE SAME DAY AS LONG AS YOU CALL BEFORE 4:00 PM

## 2021-02-09 ENCOUNTER — Other Ambulatory Visit: Payer: Self-pay | Admitting: Family Medicine

## 2021-02-09 ENCOUNTER — Other Ambulatory Visit (HOSPITAL_COMMUNITY): Payer: Self-pay

## 2021-02-09 MED ORDER — ATORVASTATIN CALCIUM 20 MG PO TABS
20.0000 mg | ORAL_TABLET | Freq: Every day | ORAL | 0 refills | Status: DC
  Filled 2021-02-09: qty 90, 90d supply, fill #0

## 2021-02-16 MED FILL — Sacubitril-Valsartan Tab 97-103 MG: ORAL | 30 days supply | Qty: 60 | Fill #2 | Status: AC

## 2021-02-17 ENCOUNTER — Other Ambulatory Visit (HOSPITAL_COMMUNITY): Payer: Self-pay

## 2021-02-22 DIAGNOSIS — R002 Palpitations: Secondary | ICD-10-CM | POA: Diagnosis not present

## 2021-03-04 ENCOUNTER — Telehealth (HOSPITAL_COMMUNITY): Payer: Self-pay

## 2021-03-04 NOTE — Telephone Encounter (Signed)
Pt aware of results of long term monitor. Pt reports palpitations have subsided since quitting coffee. Per Dr Milas Kocher, pt will keep echo on 12/27 and will see nurses for ekg. At that point will discuss need for mexelitine. Pt verbalized understanding.

## 2021-03-04 NOTE — Telephone Encounter (Signed)
-----   Message from Dolores Patty, MD sent at 03/04/2021 12:32 PM EST ----- Please start mexilitene 200 bid for PVCs.   See me in 2 months with echo

## 2021-03-09 ENCOUNTER — Encounter: Payer: Self-pay | Admitting: Registered Nurse

## 2021-03-09 ENCOUNTER — Telehealth (INDEPENDENT_AMBULATORY_CARE_PROVIDER_SITE_OTHER): Payer: BC Managed Care – PPO | Admitting: Registered Nurse

## 2021-03-09 ENCOUNTER — Other Ambulatory Visit (HOSPITAL_COMMUNITY): Payer: Self-pay

## 2021-03-09 ENCOUNTER — Other Ambulatory Visit: Payer: Self-pay

## 2021-03-09 DIAGNOSIS — R051 Acute cough: Secondary | ICD-10-CM

## 2021-03-09 MED ORDER — PROMETHAZINE-DM 6.25-15 MG/5ML PO SYRP
5.0000 mL | ORAL_SOLUTION | Freq: Four times a day (QID) | ORAL | 0 refills | Status: DC | PRN
  Filled 2021-03-09: qty 118, 6d supply, fill #0

## 2021-03-09 MED ORDER — ALBUTEROL SULFATE HFA 108 (90 BASE) MCG/ACT IN AERS
2.0000 | INHALATION_SPRAY | Freq: Four times a day (QID) | RESPIRATORY_TRACT | 0 refills | Status: DC | PRN
  Filled 2021-03-09: qty 8.5, 25d supply, fill #0

## 2021-03-09 MED ORDER — AZELASTINE HCL 0.1 % NA SOLN
1.0000 | Freq: Two times a day (BID) | NASAL | 12 refills | Status: DC
  Filled 2021-03-09: qty 30, 50d supply, fill #0

## 2021-03-09 NOTE — Progress Notes (Signed)
Telemedicine Encounter- SOAP NOTE Established Patient  This telephone encounter was conducted with the patient's (or proxy's) verbal consent via audio telecommunications: yes/no: Yes Patient was instructed to have this encounter in a suitably private space; and to only have persons present to whom they give permission to participate. In addition, patient identity was confirmed by use of name plus two identifiers (DOB and address).  I discussed the limitations, risks, security and privacy concerns of performing an evaluation and management service by telephone and the availability of in person appointments. I also discussed with the patient that there may be a patient responsible charge related to this service. The patient expressed understanding and agreed to proceed.  I spent a total of 13 minutes talking with the patient or their proxy.  Patient at home Provider in office  Participants: Troy Sportsman, NP and [name]  Chief Complaint  Patient presents with   Cough    Patient states for about 3 days he has been experiencing some cough, nasal congestion , sinus nasal burning , and just feeling down, Pt states his son had the same symptoms from school.    Subjective   [name] is a 50 y.o. established patient. Telephone visit today for cough  HPI Onset Sunday - chest congestion, pain with cough, cough is productive. Now having sinus congestion, PND Worse at night and mornings Has taken delsym with good effect.   Son had similar symptoms last week - tested negative for covid and flu Symptoms resolved with conservative management  Otherwise no acute concerns at this time.  No cardiac symptoms with this illness.   Patient Active Problem List   Diagnosis Date Noted   Obesity (BMI 30-39.9) 04/16/2018   HTN (hypertension) 10/04/2015   Physical exam 03/11/2014   Chronic systolic heart failure (HCC) 06/16/2013   Anxiety state, unspecified 05/23/2013    DERMATOPHYTOSIS OF FOOT 09/27/2009   Hyperlipidemia 04/06/2009    Past Medical History:  Diagnosis Date   Athlete's foot    CHF (congestive heart failure) (HCC)    a) NICM b) ECHO (05/2013) EF 10%, diff HK, large mural thrombus, mild MR, LA sev. dilated, RA mildly dilated, mod TR c) Swan GC (06/02/13): CVP 19, PA 41/25 (33), PCWP 29, Thermo CO/CI 2.4 / 1.1 d) cMRI EF 31%, no definite myocardial delayed enhancement    Hypertension    NICM (nonischemic cardiomyopathy) (HCC)    a) LHC (06/04/2013): normal coronary arteries    Current Outpatient Medications  Medication Sig Dispense Refill   albuterol (VENTOLIN HFA) 108 (90 Base) MCG/ACT inhaler Inhale 2 puffs into the lungs every 6 (six) hours as needed for wheezing or shortness of breath. 8.5 g 0   aspirin EC 81 MG tablet Take 81 mg by mouth daily.     atorvastatin (LIPITOR) 20 MG tablet Take 1 tablet (20 mg total) by mouth daily. 90 tablet 0   azelastine (ASTELIN) 0.1 % nasal spray Place 1 spray into both nostrils 2 (two) times daily. Use in each nostril as directed 30 mL 12   carvedilol (COREG) 25 MG tablet Take 1 tablet (25 mg total) by mouth 2 (two) times daily with a meal. 180 tablet 0   fenofibrate 160 MG tablet Take 1 tablet (160 mg total) by mouth daily. 90 tablet 3   promethazine-dextromethorphan (PROMETHAZINE-DM) 6.25-15 MG/5ML syrup Take 5 mLs by mouth 4 (four) times daily as needed for cough. 118 mL 0   sacubitril-valsartan (ENTRESTO) 97-103 MG Take 1  tablet by mouth 2 (two) times daily. Please make follow up appointment for further refills. Call [phone] to schedule an appointment. 60 tablet 3   spironolactone (ALDACTONE) 25 MG tablet TAKE 1 TABLET BY MOUTH ONCE DAILY. 90 tablet 3   No current facility-administered medications for this visit.    No Known Allergies  Social History   Socioeconomic History   Marital status: Married    Spouse name: Not on file   Number of children: 0   Years of education: Not on file    Highest education level: Not on file  Occupational History   Occupation: Telephone  Tobacco Use   Smoking status: Never   Smokeless tobacco: Never  Substance and Sexual Activity   Alcohol use: No    Comment: No alcohol since 05/2013   Drug use: No   Sexual activity: Not on file  Other Topics Concern   Not on file  Social History Narrative   Lives with wife [name].   Occupation Recruitment consultant   Social Determinants of Corporate investment banker Strain: Not on file  Food Insecurity: Not on file  Transportation Needs: Not on file  Physical Activity: Not on file  Stress: Not on file  Social Connections: Not on file  Intimate Partner Violence: Not on file    ROS Per hpi   Objective   Vitals as reported by the patient: There were no vitals filed for this visit.  [name] was seen today for cough.  Diagnoses and all orders for this visit:  Acute cough -     albuterol (VENTOLIN HFA) 108 (90 Base) MCG/ACT inhaler; Inhale 2 puffs into the lungs every 6 (six) hours as needed for wheezing or shortness of breath. -     azelastine (ASTELIN) 0.1 % nasal spray; Place 1 spray into both nostrils 2 (two) times daily. Use in each nostril as directed -     promethazine-dextromethorphan (PROMETHAZINE-DM) 6.25-15 MG/5ML syrup; Take 5 mLs by mouth 4 (four) times daily as needed for cough.    PLAN Symptom management as above Discussed nonpharm supportive care including nasal saline rinse, rest, hydration, deep breathing exercises. Return if worsening or failing to improve. Pt understands urgent care and ER follow up precautions Patient encouraged to call clinic with any questions, comments, or concerns.  I discussed the assessment and treatment plan with the patient. The patient was provided an opportunity to ask questions and all were answered. The patient agreed with the plan and demonstrated an understanding of the instructions.   The patient was advised to call back or seek  an in-person evaluation if the symptoms worsen or if the condition fails to improve as anticipated.  I provided 13 minutes of non-face-to-face time during this encounter.  Troy Agee, NP

## 2021-03-09 NOTE — Patient Instructions (Signed)
° ° ° °  If you have lab work done today you will be contacted with your lab results within the next 2 weeks.  If you have not heard from us then please contact us. The fastest way to get your results is to register for My Chart. ° ° °IF you received an x-ray today, you will receive an invoice from [facility]. Please contact [facility] at [phone] with questions or concerns regarding your invoice.  ° °IF you received labwork today, you will receive an invoice from [facility]. Please contact [facility] at [phone] with questions or concerns regarding your invoice.  ° °Our billing staff will not be able to assist you with questions regarding bills from these companies. ° °You will be contacted with the lab results as soon as they are available. The fastest way to get your results is to activate your My Chart account. Instructions are located on the last page of this paperwork. If you have not heard from us regarding the results in 2 weeks, please contact this office. °  ° ° ° °

## 2021-03-15 ENCOUNTER — Ambulatory Visit (HOSPITAL_COMMUNITY)
Admission: RE | Admit: 2021-03-15 | Discharge: 2021-03-15 | Disposition: A | Discharge status: Home / Self Care | Payer: BC Managed Care – PPO | Source: Ambulatory Visit | Attending: Internal Medicine | Admitting: Internal Medicine

## 2021-03-15 ENCOUNTER — Encounter (HOSPITAL_COMMUNITY): Payer: Self-pay

## 2021-03-15 ENCOUNTER — Other Ambulatory Visit (HOSPITAL_COMMUNITY): Payer: Self-pay | Admitting: Internal Medicine

## 2021-03-15 ENCOUNTER — Other Ambulatory Visit (HOSPITAL_COMMUNITY): Payer: Self-pay

## 2021-03-15 ENCOUNTER — Other Ambulatory Visit: Payer: Self-pay

## 2021-03-15 ENCOUNTER — Ambulatory Visit (HOSPITAL_BASED_OUTPATIENT_CLINIC_OR_DEPARTMENT_OTHER)
Admission: RE | Admit: 2021-03-15 | Discharge: 2021-03-15 | Disposition: A | Discharge status: Home / Self Care | Payer: BC Managed Care – PPO | Source: Ambulatory Visit | Attending: Cardiology | Admitting: Cardiology

## 2021-03-15 VITALS — BP 118/78 | HR 68 | Wt 230.4 lb

## 2021-03-15 DIAGNOSIS — I5022 Chronic systolic (congestive) heart failure: Secondary | ICD-10-CM

## 2021-03-15 DIAGNOSIS — E785 Hyperlipidemia, unspecified: Secondary | ICD-10-CM | POA: Insufficient documentation

## 2021-03-15 DIAGNOSIS — R9431 Abnormal electrocardiogram [ECG] [EKG]: Secondary | ICD-10-CM | POA: Diagnosis not present

## 2021-03-15 DIAGNOSIS — I110 Hypertensive heart disease with heart failure: Secondary | ICD-10-CM | POA: Diagnosis not present

## 2021-03-15 DIAGNOSIS — I493 Ventricular premature depolarization: Secondary | ICD-10-CM | POA: Diagnosis not present

## 2021-03-15 LAB — ECHOCARDIOGRAM COMPLETE
Area-P 1/2: 3.72 cm2
Calc EF: 31.8 %
S' Lateral: 4.7 cm
Single Plane A2C EF: 36.6 %
Single Plane A4C EF: 26.7 %

## 2021-03-15 MED ORDER — MEXILETINE HCL 200 MG PO CAPS
200.0000 mg | ORAL_CAPSULE | Freq: Two times a day (BID) | ORAL | 6 refills | Status: DC
  Filled 2021-03-15: qty 60, 30d supply, fill #0

## 2021-03-15 NOTE — Progress Notes (Signed)
°  Echocardiogram 2D Echocardiogram has been performed.  Janalyn Harder 03/15/2021, 8:41 AM

## 2021-03-15 NOTE — Progress Notes (Signed)
Patient in [facility] for nurse visit per Dr. Gala Romney.  EKG ordered today and shows frequent PVCs.  Patient made aware and per Dr. Prescott Gum previous note patient will begin Mexiletine 200 mg BID.  Prescription sent to patient's pharmacy of choice.  He is aware to call the [facility] with any concerns or questions.  He also has a scheduled follow up appt 05/18/21 with Dr. Gala Romney.

## 2021-03-15 NOTE — Patient Instructions (Addendum)
To begin Mexiletine 200 mg twice daily. Please call our clinic with any follow up concerns or questions. Follow-up appt as scheduled with Dr. Gala Romney 05/18/21

## 2021-03-16 ENCOUNTER — Other Ambulatory Visit (HOSPITAL_COMMUNITY): Payer: Self-pay

## 2021-03-16 MED ORDER — SPIRONOLACTONE 25 MG PO TABS
25.0000 mg | ORAL_TABLET | Freq: Every day | ORAL | 3 refills | Status: DC
  Filled 2021-03-16: qty 90, 90d supply, fill #0
  Filled 2021-06-15: qty 90, 90d supply, fill #1
  Filled 2021-09-30: qty 90, 90d supply, fill #2
  Filled 2022-01-10: qty 90, 90d supply, fill #3

## 2021-03-26 MED FILL — Sacubitril-Valsartan Tab 97-103 MG: ORAL | 30 days supply | Qty: 60 | Fill #3 | Status: AC

## 2021-03-28 ENCOUNTER — Other Ambulatory Visit (HOSPITAL_COMMUNITY): Payer: Self-pay

## 2021-04-21 ENCOUNTER — Other Ambulatory Visit (HOSPITAL_COMMUNITY): Payer: Self-pay | Admitting: Internal Medicine

## 2021-04-22 ENCOUNTER — Other Ambulatory Visit (HOSPITAL_COMMUNITY): Payer: Self-pay

## 2021-04-22 ENCOUNTER — Other Ambulatory Visit (HOSPITAL_COMMUNITY): Payer: Self-pay | Admitting: Internal Medicine

## 2021-04-25 ENCOUNTER — Other Ambulatory Visit (HOSPITAL_COMMUNITY): Payer: Self-pay

## 2021-04-25 MED ORDER — CARVEDILOL 25 MG PO TABS
25.0000 mg | ORAL_TABLET | Freq: Two times a day (BID) | ORAL | 0 refills | Status: DC
  Filled 2021-04-25: qty 180, 90d supply, fill #0

## 2021-04-25 MED ORDER — ENTRESTO 97-103 MG PO TABS
1.0000 | ORAL_TABLET | Freq: Two times a day (BID) | ORAL | 3 refills | Status: DC
  Filled 2021-04-25: qty 60, 30d supply, fill #0
  Filled 2021-05-24: qty 60, 30d supply, fill #1
  Filled 2021-07-04: qty 60, 30d supply, fill #2
  Filled 2021-08-15: qty 60, 30d supply, fill #3

## 2021-05-18 ENCOUNTER — Other Ambulatory Visit: Payer: Self-pay

## 2021-05-18 ENCOUNTER — Ambulatory Visit (HOSPITAL_COMMUNITY)
Admission: RE | Admit: 2021-05-18 | Discharge: 2021-05-18 | Disposition: A | Discharge status: Home / Self Care | Payer: BC Managed Care – PPO | Source: Ambulatory Visit | Attending: Internal Medicine | Admitting: Internal Medicine

## 2021-05-18 ENCOUNTER — Encounter (HOSPITAL_COMMUNITY): Payer: Self-pay | Admitting: Internal Medicine

## 2021-05-18 ENCOUNTER — Other Ambulatory Visit (HOSPITAL_COMMUNITY): Payer: Self-pay

## 2021-05-18 VITALS — BP 130/90 | HR 77 | Wt 238.4 lb

## 2021-05-18 DIAGNOSIS — E785 Hyperlipidemia, unspecified: Secondary | ICD-10-CM | POA: Diagnosis not present

## 2021-05-18 DIAGNOSIS — I428 Other cardiomyopathies: Secondary | ICD-10-CM | POA: Diagnosis not present

## 2021-05-18 DIAGNOSIS — Z79899 Other long term (current) drug therapy: Secondary | ICD-10-CM | POA: Diagnosis not present

## 2021-05-18 DIAGNOSIS — I5022 Chronic systolic (congestive) heart failure: Secondary | ICD-10-CM

## 2021-05-18 DIAGNOSIS — G4733 Obstructive sleep apnea (adult) (pediatric): Secondary | ICD-10-CM | POA: Insufficient documentation

## 2021-05-18 DIAGNOSIS — E669 Obesity, unspecified: Secondary | ICD-10-CM | POA: Insufficient documentation

## 2021-05-18 DIAGNOSIS — I110 Hypertensive heart disease with heart failure: Secondary | ICD-10-CM | POA: Diagnosis not present

## 2021-05-18 DIAGNOSIS — I493 Ventricular premature depolarization: Secondary | ICD-10-CM | POA: Insufficient documentation

## 2021-05-18 DIAGNOSIS — F1090 Alcohol use, unspecified, uncomplicated: Secondary | ICD-10-CM | POA: Insufficient documentation

## 2021-05-18 MED ORDER — FLECAINIDE ACETATE 50 MG PO TABS
75.0000 mg | ORAL_TABLET | Freq: Two times a day (BID) | ORAL | 6 refills | Status: DC
  Filled 2021-05-18: qty 90, 30d supply, fill #0
  Filled 2021-06-15: qty 90, 30d supply, fill #1
  Filled 2021-07-25: qty 90, 30d supply, fill #2
  Filled 2021-08-29: qty 90, 30d supply, fill #3
  Filled 2021-09-16: qty 90, 30d supply, fill #4
  Filled 2021-11-11: qty 90, 30d supply, fill #5
  Filled 2021-12-13: qty 90, 30d supply, fill #6

## 2021-05-18 NOTE — Progress Notes (Deleted)
stop

## 2021-05-18 NOTE — Addendum Note (Signed)
Encounter addended by: Scarlette Calico, RN on: 05/18/2021 11:37 AM ? Actions taken: Clinical Note Signed

## 2021-05-18 NOTE — Progress Notes (Signed)
Date:  05/18/2021 ?Patient given home sleep study device and instructions for home use by [facility] Nurse.  He is aware to await our call before beginning the study to assure that insurance prior Berkley Harvey is not needed or is completed. ? ?STOP BANG RISK ASSESSMENT ?S (snore) Have you been told that you snore?     YES ?  ?T (tired) Are you often tired, fatigued, or sleepy during the day?  ? YES  ?O (obstruction) Do you stop breathing, choke, or gasp during sleep? NO ?  ?P (pressure) Do you have or are you being treated for high blood pressure? YES ?  ?B (BMI) Is your body index greater than 35 kg/m? NO ?  ?A (age) Are you 23 years old or older? NO ?  ?N (neck) Do you have a neck circumference greater than 16 inches?  ? NO ?  ?G (gender) Are you a male? YES ?  ?TOTAL STOP/BANG ?YES? ANSWERS 4  ? ? ?  ?

## 2021-05-18 NOTE — Addendum Note (Signed)
Encounter addended by: Crissie Figures, RN on: 05/18/2021 3:18 PM ? Actions taken: Delete clinical note, Clinical Note Signed

## 2021-05-18 NOTE — Addendum Note (Signed)
Encounter addended by: Dolores Patty, MD on: 05/18/2021 11:27 AM ? Actions taken: Clinical Note Signed

## 2021-05-18 NOTE — Addendum Note (Signed)
Encounter addended by: Noralee Space, RN on: 05/18/2021 11:34 AM ? Actions taken: Order list changed, Diagnosis association updated

## 2021-05-18 NOTE — Progress Notes (Addendum)
? ? ?ADVANCED [facility] CLINIC NOTE ? ? ?Patient ID: [name], male   DOB: 1970-05-19, 51 y.o.   MRN: [id number] ?PCP: Dr Birdie Riddle ?Pulmonary: Dr Elsworth Soho ? ?HPI: ?[name] is a 51 y.o. male with a history of ETOH consumption, obesity, and newly diagnosed acute systolic heart failure [id number].  ? ?Admitted to [facility] 05/31/13 with dyspnea and lower extremity edema which was from acute systolic heart failure. Echo LVEF 10% with global HK, large LV thrombus and severe RV dysfunction.  LHC 05/2013 normal cors. Required short course of Milrinone. He was discharged with lifevest.  Discharge weight was 191 pounds.  ? ?At last visit was having multiple PVCs on ECG. Feels anxious about his heart condition. Denies CP, SOB, orthopnea or PND. Rare ETOH. No Caffeine  Mexilitene started but couldn't tolerate due to feeling rundown. Feels good otherwise. Back to work.  Mild snoring.  ? ?Echo 12/22 EF 45-50% ? ?Zio 12/22:  ?- Isolated PVCs were frequent (16.8%, [id number]), - mostly one morphology  ?- PVCs couplets were occasional (4.3%, 15331) ? ?Studies: ? ?Echo 2/20 EF 45-50% ? ?Echo 7/18 EF 35-40% ? ?ECHO 06/01/13 EF 10%  ?ECHO 09/03/13  EF 25-30% RV normal No LV thrombus ?cMRI 8/15 EF 31% no scar ?Bedside echo (11/11/13) EF 30-35% ?ECHO 11/15 EF 40%, diffuse hypokinesis ?ECHO 5/16 EF 40-45%, mild to moderately dilated LV, normal RV size and systolic function ? ?CPX (2/16) with peak VO2 27.4, VE/VCO2 slope 27.4, RER 1.28 => low normal functional capacity.                                                                                               ? 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 Prior heavy ETOH, now rare.  ? ?FH: Mom: HTN, DM  ?     Grandmother: HTN, DM, heart disease  ? ?Review of systems complete and found to be negative unless listed in HPI.   ? ?Past Medical History:  ?Diagnosis Date  ? Athlete's foot   ? CHF (congestive heart failure) ([place])   ? a) NICM b) ECHO (05/2013) EF 10%, diff HK,  large mural thrombus, mild MR, LA sev. dilated, RA mildly dilated, mod TR c) Swan GC (06/02/13): CVP 19, PA 41/25 (33), PCWP 29, Thermo CO/CI 2.4 / 1.1 d) cMRI EF 31%, no definite myocardial delayed enhancement   ? Hypertension   ? NICM (nonischemic cardiomyopathy) ([place])   ? a) LHC (06/04/2013): normal coronary arteries  ? ? ?Current Outpatient Medications  ?Medication Sig Dispense Refill  ? aspirin EC 81 MG tablet Take 81 mg by mouth daily.    ? atorvastatin (LIPITOR) 20 MG tablet Take 1 tablet (20 mg total) by mouth daily. 90 tablet 0  ? carvedilol (COREG) 25 MG tablet Take 1 tablet (25 mg total) by mouth 2 (two) times daily with a meal. 180 tablet 0  ? fenofibrate 160 MG tablet Take 1 tablet (160 mg total) by mouth daily. 90 tablet 3  ? sacubitril-valsartan (ENTRESTO) 97-103 MG Take 1 tablet by mouth 2 (two) times  daily. 60 tablet 3  ? spironolactone (ALDACTONE) 25 MG tablet Take 1 tablet (25 mg total) by mouth daily. 90 tablet 3  ? ?No current facility-administered medications for this encounter.  ? ?Vitals:  ? 05/18/21 1015  ?BP: 130/90  ?Pulse: 77  ?SpO2: 98%  ?Weight: 108.1 kg (238 lb 6.4 oz)  ?  ?Wt Readings from Last 3 Encounters:  ?05/18/21 108.1 kg (238 lb 6.4 oz)  ?03/15/21 104.5 kg (230 lb 6.4 oz)  ?02/01/21 107.3 kg (236 lb 9.6 oz)  ?  ?PHYSICAL EXAM: ?General:  Well appearing. No resp difficulty ?HEENT: normal ?Neck: supple. no JVD. Carotids 2+ bilat; no bruits. No lymphadenopathy or thryomegaly appreciated. ?Cor: PMI nondisplaced. Regular rate & rhythm. No rubs, gallops or murmurs. ?Lungs: clear ?Abdomen: soft, nontender, nondistended. No hepatosplenomegaly. No bruits or masses. Good bowel sounds. ?Extremities: no cyanosis, clubbing, rash, edema ?Neuro: alert & orientedx3, cranial nerves grossly intact. moves all 4 extremities w/o difficulty. Affect pleasant ? ? ?ECG: NSR 75  occasional PVCs with 1 morphology. Personally reviewed ? ?ASSESSMENT & PLAN: ? ?1. Chronic Systolic Heart Failure: Nonischemic  cardiomyopathy (ETOH may have played a role), echo with EF 10% in 3/15. ECHO 07/2014 40-45%.  ?- cMRI 10/2013 with LVEF 31%, Normal RV, No LGE.  ?- Echo 7/18 EF 35-40%  ?- Echo 2/20 EF 45-50% ?- Echo 12/22 EF 45-50% ?- NYHA I  ?- Volume looks good ?- Not on diuretics ?- Continue spiro 25 mg daily ?- Continue coreg 25 mg BID ?- Continue Entresto to 97/103 mg BID.  ?- having frequent PVCs. Unable to tolerate mexilitene. D/w EP. Will start flecainide 75 bid. If PVC suppress and EF normalizes can consider stopping flecainide and considering ablation ?2. NSVT:  ?- Resolved. EF improved to out of ICD range.  ?3. LV thrombus:  ?- Resolved.  ?4. Mild OSA:  ?- Per Dr Elsworth Soho mild sleep apnea in 5/15 No intervention needed. Still snoring. With frequent PVCs will arrange HST.  ?5. Hyperlipidemia:  ?- Continue statin.   ?- PCP following lipids.  ?6. Frequent PVCs. ?- high burden on Zio  ?- Plan as above. ?- Start Flecainide. Will need exercise test in 1 week to look for pro-arrhythmia. ? ?Glori Bickers, MD  ?05/18/2021 ? ? ? ?

## 2021-05-18 NOTE — Patient Instructions (Signed)
Start Flecainide 75 mg (1 & 1/2 tabs) Twice daily  ? ?Your physician has requested that you have an exercise tolerance test. For further information please visit [email or web address] Please also follow instruction sheet, as given. THIS IS DONE AT [facility], they will call you to schedule ? ?Your physician recommends that you schedule a follow-up appointment in: 3 MONTHS ? ?If you have any questions or concerns before your next appointment please send [place] a message through [place] or call our office at [phone].   ? ?TO LEAVE A MESSAGE FOR THE NURSE SELECT OPTION 2, PLEASE LEAVE A MESSAGE INCLUDING: ?YOUR NAME ?DATE OF BIRTH ?CALL BACK NUMBER ?REASON FOR CALL**this is important as we prioritize the call backs ? ?YOU WILL RECEIVE A CALL BACK THE SAME DAY AS LONG AS YOU CALL BEFORE 4:00 PM ? ?At the [facility], you and your health needs are our priority. As part of our continuing mission to provide you with exceptional heart care, we have created designated Provider Care Teams. These Care Teams include your primary Cardiologist (physician) and Advanced Practice Providers (APPs- Physician Assistants and Nurse Practitioners) who all work together to provide you with the care you need, when you need it.  ? ?You may see any of the following providers on your designated Care Team at your next follow up: ?Dr Arvilla Meres ?Dr Marca Ancona ?Tonye Becket, NP ?Robbie Lis, PA ?Jessica Milford,NP ?Anna Genre, PA ?Karle Plumber, PharmD ? ? ?Please be sure to bring in all your medications bottles to every appointment.  ? ? ?

## 2021-05-19 NOTE — Addendum Note (Signed)
Encounter addended by: Dolores Patty, MD on: 05/19/2021 10:05 PM ? Actions taken: Order list changed

## 2021-05-24 ENCOUNTER — Other Ambulatory Visit (HOSPITAL_COMMUNITY): Payer: Self-pay

## 2021-05-24 ENCOUNTER — Telehealth (HOSPITAL_COMMUNITY): Payer: Self-pay | Admitting: Surgery

## 2021-05-24 ENCOUNTER — Other Ambulatory Visit: Payer: Self-pay | Admitting: Family Medicine

## 2021-05-24 MED ORDER — ATORVASTATIN CALCIUM 20 MG PO TABS
20.0000 mg | ORAL_TABLET | Freq: Every day | ORAL | 0 refills | Status: DC
  Filled 2021-05-24: qty 90, 90d supply, fill #0

## 2021-05-24 NOTE — Addendum Note (Signed)
Encounter addended by: Noralee Space, RN on: 05/24/2021 12:25 PM ? Actions taken: Order list changed, Diagnosis association updated

## 2021-05-24 NOTE — Telephone Encounter (Signed)
Patient insurance prefers for sleep study to be completed in lab.  Message forwarded to Dr. Gala Romney for order to refer for in lab split night sleep study. ?

## 2021-05-27 ENCOUNTER — Telehealth (HOSPITAL_COMMUNITY): Payer: Self-pay | Admitting: *Deleted

## 2021-05-27 DIAGNOSIS — I493 Ventricular premature depolarization: Secondary | ICD-10-CM

## 2021-05-27 NOTE — Telephone Encounter (Signed)
Attestation order placed. 

## 2021-06-03 ENCOUNTER — Other Ambulatory Visit (HOSPITAL_COMMUNITY): Payer: Self-pay | Admitting: Surgery

## 2021-06-03 DIAGNOSIS — G4733 Obstructive sleep apnea (adult) (pediatric): Secondary | ICD-10-CM

## 2021-06-03 NOTE — Progress Notes (Signed)
Order placed for split night sleep study as patient's insurance prefers in lab sleep test. ?

## 2021-06-09 ENCOUNTER — Telehealth (HOSPITAL_COMMUNITY): Payer: Self-pay | Admitting: *Deleted

## 2021-06-09 NOTE — Telephone Encounter (Signed)
Close encounter 

## 2021-06-10 ENCOUNTER — Ambulatory Visit (HOSPITAL_COMMUNITY)
Admission: RE | Admit: 2021-06-10 | Discharge: 2021-06-10 | Disposition: A | Discharge status: Home / Self Care | Payer: BC Managed Care – PPO | Source: Ambulatory Visit | Attending: Cardiovascular Disease | Admitting: Cardiovascular Disease

## 2021-06-10 ENCOUNTER — Other Ambulatory Visit: Payer: Self-pay

## 2021-06-10 DIAGNOSIS — I493 Ventricular premature depolarization: Secondary | ICD-10-CM | POA: Insufficient documentation

## 2021-06-10 LAB — EXERCISE TOLERANCE TEST
Angina Index: 0
Base ST Depression (mm): 0 mm
Duke Treadmill Score: 10
Estimated workload: 12.3
Exercise duration (min): 10 min
Exercise duration (sec): 19 s
MPHR: 172 {beats}/min
Peak HR: 151 {beats}/min
Percent HR: 87 %
Rest HR: 68 {beats}/min
ST Depression (mm): 0 mm

## 2021-06-13 ENCOUNTER — Telehealth: Payer: Self-pay | Admitting: Family Medicine

## 2021-06-13 ENCOUNTER — Other Ambulatory Visit: Payer: Self-pay

## 2021-06-13 ENCOUNTER — Ambulatory Visit (INDEPENDENT_AMBULATORY_CARE_PROVIDER_SITE_OTHER): Payer: BC Managed Care – PPO | Admitting: Family Medicine

## 2021-06-13 ENCOUNTER — Encounter: Payer: Self-pay | Admitting: Family Medicine

## 2021-06-13 VITALS — BP 128/70 | HR 61 | Temp 97.8°F | Resp 16 | Wt 240.2 lb

## 2021-06-13 DIAGNOSIS — E669 Obesity, unspecified: Secondary | ICD-10-CM

## 2021-06-13 DIAGNOSIS — E785 Hyperlipidemia, unspecified: Secondary | ICD-10-CM | POA: Diagnosis not present

## 2021-06-13 DIAGNOSIS — I10 Essential (primary) hypertension: Secondary | ICD-10-CM | POA: Diagnosis not present

## 2021-06-13 LAB — CBC WITH DIFFERENTIAL/PLATELET
Basophils Absolute: 0.1 10*3/uL (ref 0.0–0.1)
Basophils Relative: 0.7 % (ref 0.0–3.0)
Eosinophils Absolute: 0.3 10*3/uL (ref 0.0–0.7)
Eosinophils Relative: 3.6 % (ref 0.0–5.0)
HCT: 41.2 % (ref 39.0–52.0)
Hemoglobin: 14 g/dL (ref 13.0–17.0)
Lymphocytes Relative: 29.8 % (ref 12.0–46.0)
Lymphs Abs: 2.4 10*3/uL (ref 0.7–4.0)
MCHC: 34.1 g/dL (ref 30.0–36.0)
MCV: 92.6 fl (ref 78.0–100.0)
Monocytes Absolute: 0.5 10*3/uL (ref 0.1–1.0)
Monocytes Relative: 6.9 % (ref 3.0–12.0)
Neutro Abs: 4.7 10*3/uL (ref 1.4–7.7)
Neutrophils Relative %: 59 % (ref 43.0–77.0)
Platelets: 214 10*3/uL (ref 150.0–400.0)
RBC: 4.45 Mil/uL (ref 4.22–5.81)
RDW: 12.6 % (ref 11.5–15.5)
WBC: 7.9 10*3/uL (ref 4.0–10.5)

## 2021-06-13 LAB — BASIC METABOLIC PANEL
BUN: 14 mg/dL (ref 6–23)
CO2: 26 mEq/L (ref 19–32)
Calcium: 9.4 mg/dL (ref 8.4–10.5)
Chloride: 103 mEq/L (ref 96–112)
Creatinine, Ser: 0.96 mg/dL (ref 0.40–1.50)
GFR: 93.15 mL/min (ref >60.00)
Glucose, Bld: 82 mg/dL (ref 70–99)
Potassium: 3.9 mEq/L (ref 3.5–5.1)
Sodium: 138 mEq/L (ref 135–145)

## 2021-06-13 LAB — LIPID PANEL
Cholesterol: 156 mg/dL (ref 0–200)
HDL: 17.4 mg/dL — ABNORMAL LOW (ref >39.00)
NonHDL: 139
Total CHOL/HDL Ratio: 9
Triglycerides: 323 mg/dL — ABNORMAL HIGH (ref 0.0–149.0)
VLDL: 64.6 mg/dL — ABNORMAL HIGH (ref 0.0–40.0)

## 2021-06-13 LAB — HEPATIC FUNCTION PANEL
ALT: 39 U/L (ref 0–53)
AST: 24 U/L (ref 0–37)
Albumin: 4.8 g/dL (ref 3.5–5.2)
Alkaline Phosphatase: 38 U/L — ABNORMAL LOW (ref 39–117)
Bilirubin, Direct: 0.1 mg/dL (ref 0.0–0.3)
Total Bilirubin: 0.7 mg/dL (ref 0.2–1.2)
Total Protein: 7.4 g/dL (ref 6.0–8.3)

## 2021-06-13 LAB — LDL CHOLESTEROL, DIRECT: Direct LDL: 102 mg/dL

## 2021-06-13 NOTE — Assessment & Plan Note (Signed)
Chronic problem.  Tolerating Lipitor 20mg  and Fenofibrate 160mg  w/o difficulty.  Check labs.  Adjust meds prn  ?

## 2021-06-13 NOTE — Progress Notes (Signed)
? ?  Subjective:  ? ? Patient ID: [name], male    DOB: Oct 22, 1970, 51 y.o.   MRN: [id number] ? ?HPI ?HTN- chronic problem, on Coreg 25mg  BID, Entresto 97/103mg  BID, Spironolactone 25mg  daily w/ good control.  Pt reports feeling good.  No CP, SOB, HAs, visual changes, edema. ? ?Hyperlipidemia- chronic problem, on Lipitor 20mg  daily and Fenofibrate 160mg  daily.  No abd pain, N/V. ? ?Obesity- pt has gained 7 lbs since last visit.  Plays daily w/ son but no formal exercise. ? ? ?Review of Systems ?For ROS see HPI  ? ?This visit occurred during the SARS-CoV-2 public health emergency.  Safety protocols were in place, including screening questions prior to the visit, additional usage of staff PPE, and extensive cleaning of exam room while observing appropriate contact time as indicated for disinfecting solutions.   ?   ?Objective:  ? Physical Exam ?Vitals reviewed.  ?Constitutional:   ?   General: He is not in acute distress. ?   Appearance: Normal appearance. He is well-developed. He is obese.  ?HENT:  ?   Head: Normocephalic and atraumatic.  ?Eyes:  ?   Extraocular Movements: Extraocular movements intact.  ?   Conjunctiva/sclera: Conjunctivae normal.  ?   Pupils: Pupils are equal, round, and reactive to light.  ?Neck:  ?   Thyroid: No thyromegaly.  ?Cardiovascular:  ?   Rate and Rhythm: Normal rate and regular rhythm.  ?   Pulses: Normal pulses.  ?   Heart sounds: Normal heart sounds. No murmur heard. ?Pulmonary:  ?   Effort: Pulmonary effort is normal. No respiratory distress.  ?   Breath sounds: Normal breath sounds.  ?Abdominal:  ?   General: Bowel sounds are normal. There is no distension.  ?   Palpations: Abdomen is soft.  ?Musculoskeletal:  ?   Cervical back: Normal range of motion and neck supple.  ?   Right lower leg: No edema.  ?   Left lower leg: No edema.  ?Lymphadenopathy:  ?   Cervical: No cervical adenopathy.  ?Skin: ?   General: Skin is warm and dry.  ?Neurological:  ?   General: No focal deficit  present.  ?   Mental Status: He is alert and oriented to person, place, and time.  ?   Cranial Nerves: No cranial nerve deficit.  ?Psychiatric:     ?   Mood and Affect: Mood normal.     ?   Behavior: Behavior normal.  ? ? ? ? ? ?   ?Assessment & Plan:  ? ? ?

## 2021-06-13 NOTE — Telephone Encounter (Signed)
Pt got his results from the stress test he had done on Friday, he wanted to know if Beverely Low sees anything concerning.  ? ?Please advise  ?

## 2021-06-13 NOTE — Assessment & Plan Note (Signed)
Deteriorated.  Pt has gained 7 lbs since last visit.  Plays daily outside w/ son but no formal exercise.  Encouraged low carb diet and regular exercise.  Will continue to follow. ?

## 2021-06-13 NOTE — Assessment & Plan Note (Signed)
Chronic problem.  Currently well controlled on Coreg BID, Entresto BID, Spironolactone daily.  Asymptomatic.  Check labs but no anticipated med changes. ?

## 2021-06-13 NOTE — Patient Instructions (Signed)
Schedule your complete physical in 6 months We'll notify you of your lab results and make any changes if needed Continue to work on healthy diet and regular exercise- you can do it! Call with any questions or concerns Stay Safe!  Stay Healthy! Happy Early Birthday!!! 

## 2021-06-14 LAB — TSH: TSH: 3.91 u[IU]/mL (ref 0.35–5.50)

## 2021-06-14 NOTE — Telephone Encounter (Signed)
Spoke to patient and let him know that it would be better if he called his cardiologist so they could better explain the charts to him. He voiced understanding ?

## 2021-06-15 ENCOUNTER — Other Ambulatory Visit (HOSPITAL_COMMUNITY): Payer: Self-pay

## 2021-06-17 ENCOUNTER — Ambulatory Visit: Payer: BC Managed Care – PPO | Admitting: Family Medicine

## 2021-07-04 ENCOUNTER — Other Ambulatory Visit (HOSPITAL_COMMUNITY): Payer: Self-pay

## 2021-07-25 ENCOUNTER — Other Ambulatory Visit (HOSPITAL_COMMUNITY): Payer: Self-pay

## 2021-08-15 ENCOUNTER — Other Ambulatory Visit (HOSPITAL_COMMUNITY): Payer: Self-pay | Admitting: Internal Medicine

## 2021-08-15 ENCOUNTER — Other Ambulatory Visit: Payer: Self-pay | Admitting: Family Medicine

## 2021-08-15 NOTE — Progress Notes (Signed)
ADVANCED HF CLINIC NOTE   Patient ID: [name], male   DOB: 03-Mar-1971, 51 y.o.   MRN: [id number] PCP: Dr Birdie Riddle Pulmonary: Dr Elsworth Soho  HPI: [name] is a 51 y.o. male with a history of ETOH consumption, obesity, and systolic heart failure due to NICM (onset 3/15)  Admitted 3/15 with acute systolic heart failure. Echo EF 10% with global HK, large LV thrombus and severe RV dysfunction.  LHC 05/2013 normal cors. Required short course of Milrinone.  cMRI 8/15 EF 31% no scar  Echo 12/22 EF 45-50%  Zio 12/22:  - Isolated PVCs were frequent (16.8%, [id number]), - mostly one morphology  - PVCs couplets were occasional (4.3%, 15331)  Unable to tolerate mexilitene. In 3/23 started on flecainide  Here for f/u. Feels great. Working [employer]. No SOB, orthopnea or PND. No palpitations. Tolerating flecainide.    Studies:  ECHO 06/01/13 EF 10%  ECHO 09/03/13  EF 25-30% RV normal No LV thrombus ECHO 5/16 EF 40-45%,  Echo 2/20 EF 45-50%  CPX (2/16) with peak VO2 27.4, VE/VCO2 slope 27.4, RER 1.28 => low normal functional capacity.                                                                                                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 Prior heavy ETOH, now rare.   FH: Mom: HTN, DM       Grandmother: HTN, DM, heart disease   Review of systems complete and found to be negative unless listed in HPI.    Past Medical History:  Diagnosis Date   Athlete's foot    CHF (congestive heart failure) ([place])    a) NICM b) ECHO (05/2013) EF 10%, diff HK, large mural thrombus, mild MR, LA sev. dilated, RA mildly dilated, mod TR c) Swan GC (06/02/13): CVP 19, PA 41/25 (33), PCWP 29, Thermo CO/CI 2.4 / 1.1 d) cMRI EF 31%, no definite myocardial delayed enhancement    Hypertension    NICM (nonischemic cardiomyopathy) ([place])    a) LHC (06/04/2013): normal coronary arteries    Current Outpatient Medications  Medication Sig Dispense Refill    aspirin EC 81 MG tablet Take 81 mg by mouth daily.     atorvastatin (LIPITOR) 20 MG tablet Take 1 tablet (20 mg total) by mouth daily. 90 tablet 0   carvedilol (COREG) 25 MG tablet Take 1 tablet (25 mg total) by mouth 2 (two) times daily with a meal. 180 tablet 3   fenofibrate 160 MG tablet Take 1 tablet (160 mg total) by mouth daily. 90 tablet 3   flecainide (TAMBOCOR) 50 MG tablet Take 1.5 tablets (75 mg total) by mouth 2 (two) times daily. 90 tablet 6   sacubitril-valsartan (ENTRESTO) 97-103 MG Take 1 tablet by mouth 2 (two) times daily. 60 tablet 3   spironolactone (ALDACTONE) 25 MG tablet Take 1 tablet (25 mg total) by mouth daily. 90 tablet 3   No current facility-administered medications for this encounter.   Vitals:   08/18/21 1039  BP: 132/80  Pulse: 70  SpO2: 99%  Weight: 109 kg (240 lb 3.2 oz)     Wt Readings from Last 3 Encounters:  08/18/21 109 kg (240 lb 3.2 oz)  06/13/21 109 kg (240 lb 3.2 oz)  05/18/21 108.1 kg (238 lb 6.4 oz)    PHYSICAL EXAM: General:  Well appearing. No resp difficulty HEENT: normal Neck: supple. no JVD. Carotids 2+ bilat; no bruits. No lymphadenopathy or thryomegaly appreciated. Cor: PMI nondisplaced. Regular rate & rhythm. No rubs, gallops or murmurs. Lungs: clear Abdomen: soft, nontender, nondistended. No hepatosplenomegaly. No bruits or masses. Good bowel sounds. Extremities: no cyanosis, clubbing, rash, edema Neuro: alert & orientedx3, cranial nerves grossly intact. moves all 4 extremities w/o difficulty. Affect pleasant   ECG: NSR 77  no PVCs Intervals normal Personally reviewed  ASSESSMENT & PLAN:  1. Chronic Systolic Heart Failure: Nonischemic cardiomyopathy (ETOH may have played a role), echo with EF 10% in 3/15. ECHO 07/2014 40-45%.  - cMRI 10/2013 with LVEF 31%, Normal RV, No LGE.  - Echo 7/18 EF 35-40%  - Echo 2/20 EF 45-50% - Echo 12/22 EF 45-50% - NYHA I - Volume looks good - Not on diuretics - Continue spiro 25 mg  daily - Continue coreg 25 mg BID - Continue Entresto to 97/103 mg BID.  - Started flecainide 75 bid in 3/23. PVCs appears suppressed - Recheck echo in 1 month - If EF normalizes can consider stopping flecainide and considering ablation 2. NSVT:  - Resolved. EF improved to out of ICD range.  3. LV thrombus:  - Resolved  4. Mild OSA:  - Per Dr Elsworth Soho mild sleep apnea in 5/15 No intervention needed. Still snoring. With frequent PVCs will arrange HST. (Insurance would not approve) - I will rechack on this 5. Hyperlipidemia:  - Continue statin.   - PCP following lipids.  6. Frequent PVCs. - high burden on Zio  - Plan as above. - Started Flecainide 3/23. - No PVCs on ECG today   Glori Bickers, MD  08/18/2021

## 2021-08-16 ENCOUNTER — Other Ambulatory Visit (HOSPITAL_COMMUNITY): Payer: Self-pay

## 2021-08-16 MED ORDER — ATORVASTATIN CALCIUM 20 MG PO TABS
20.0000 mg | ORAL_TABLET | Freq: Every day | ORAL | 0 refills | Status: DC
Start: 2021-08-16 — End: 2021-11-29
  Filled 2021-08-16: qty 90, 90d supply, fill #0

## 2021-08-16 MED ORDER — CARVEDILOL 25 MG PO TABS
25.0000 mg | ORAL_TABLET | Freq: Two times a day (BID) | ORAL | 3 refills | Status: DC
Start: 2021-08-16 — End: 2022-08-15
  Filled 2021-08-16: qty 180, 90d supply, fill #0
  Filled 2021-11-29 – 2021-11-30 (×2): qty 180, 90d supply, fill #1
  Filled 2022-05-09: qty 180, 90d supply, fill #2

## 2021-08-18 ENCOUNTER — Ambulatory Visit (HOSPITAL_COMMUNITY)
Admission: RE | Admit: 2021-08-18 | Discharge: 2021-08-18 | Disposition: A | Discharge status: Home / Self Care | Payer: BC Managed Care – PPO | Source: Ambulatory Visit | Attending: Internal Medicine | Admitting: Internal Medicine

## 2021-08-18 ENCOUNTER — Encounter (HOSPITAL_COMMUNITY): Payer: Self-pay | Admitting: Internal Medicine

## 2021-08-18 VITALS — BP 132/80 | HR 70 | Wt 240.2 lb

## 2021-08-18 DIAGNOSIS — R0683 Snoring: Secondary | ICD-10-CM | POA: Diagnosis not present

## 2021-08-18 DIAGNOSIS — I493 Ventricular premature depolarization: Secondary | ICD-10-CM | POA: Diagnosis not present

## 2021-08-18 DIAGNOSIS — I110 Hypertensive heart disease with heart failure: Secondary | ICD-10-CM | POA: Insufficient documentation

## 2021-08-18 DIAGNOSIS — E669 Obesity, unspecified: Secondary | ICD-10-CM | POA: Diagnosis not present

## 2021-08-18 DIAGNOSIS — I428 Other cardiomyopathies: Secondary | ICD-10-CM | POA: Insufficient documentation

## 2021-08-18 DIAGNOSIS — Z8249 Family history of ischemic heart disease and other diseases of the circulatory system: Secondary | ICD-10-CM | POA: Insufficient documentation

## 2021-08-18 DIAGNOSIS — E785 Hyperlipidemia, unspecified: Secondary | ICD-10-CM | POA: Diagnosis not present

## 2021-08-18 DIAGNOSIS — I5022 Chronic systolic (congestive) heart failure: Secondary | ICD-10-CM

## 2021-08-18 DIAGNOSIS — G4733 Obstructive sleep apnea (adult) (pediatric): Secondary | ICD-10-CM | POA: Insufficient documentation

## 2021-08-18 DIAGNOSIS — Z8679 Personal history of other diseases of the circulatory system: Secondary | ICD-10-CM | POA: Diagnosis not present

## 2021-08-18 DIAGNOSIS — Z79899 Other long term (current) drug therapy: Secondary | ICD-10-CM | POA: Diagnosis not present

## 2021-08-18 DIAGNOSIS — F1090 Alcohol use, unspecified, uncomplicated: Secondary | ICD-10-CM | POA: Diagnosis not present

## 2021-08-18 NOTE — Addendum Note (Signed)
Encounter addended by: Noralee Space, RN on: 08/18/2021 11:37 AM  Actions taken: Order list changed, Diagnosis association updated, Clinical Note Signed

## 2021-08-18 NOTE — Patient Instructions (Signed)
Your physician has requested that you have an echocardiogram. Echocardiography is a painless test that uses sound waves to create images of your heart. It provides your doctor with information about the size and shape of your heart and how well your heart's chambers and valves are working. This procedure takes approximately one hour. There are no restrictions for this procedure.  Your physician recommends that you schedule a follow-up appointment in: 4 months, **PLEASE CALL OUR OFFICE IN AUGUST TO SCHEDULE THIS APPOINTMENT  If you have any questions or concerns before your next appointment please send [place] a message through [place] or call our office at [phone].    TO LEAVE A MESSAGE FOR THE NURSE SELECT OPTION 2, PLEASE LEAVE A MESSAGE INCLUDING: YOUR NAME DATE OF BIRTH CALL BACK NUMBER REASON FOR CALL**this is important as we prioritize the call backs  YOU WILL RECEIVE A CALL BACK THE SAME DAY AS LONG AS YOU CALL BEFORE 4:00 PM

## 2021-08-30 ENCOUNTER — Other Ambulatory Visit (HOSPITAL_COMMUNITY): Payer: Self-pay

## 2021-09-16 ENCOUNTER — Other Ambulatory Visit (HOSPITAL_COMMUNITY): Payer: Self-pay | Admitting: Internal Medicine

## 2021-09-16 ENCOUNTER — Other Ambulatory Visit (HOSPITAL_COMMUNITY): Payer: Self-pay

## 2021-09-19 ENCOUNTER — Other Ambulatory Visit (HOSPITAL_COMMUNITY): Payer: Self-pay

## 2021-09-19 MED ORDER — ENTRESTO 97-103 MG PO TABS
1.0000 | ORAL_TABLET | Freq: Two times a day (BID) | ORAL | 3 refills | Status: DC
  Filled 2021-09-19: qty 60, 30d supply, fill #0
  Filled 2021-10-24: qty 60, 30d supply, fill #1
  Filled 2021-11-29: qty 60, 30d supply, fill #2
  Filled 2021-12-29: qty 60, 30d supply, fill #3

## 2021-09-21 ENCOUNTER — Other Ambulatory Visit (HOSPITAL_COMMUNITY): Payer: Self-pay

## 2021-09-28 ENCOUNTER — Ambulatory Visit (HOSPITAL_COMMUNITY)
Admission: RE | Admit: 2021-09-28 | Discharge: 2021-09-28 | Disposition: A | Discharge status: Home / Self Care | Payer: BC Managed Care – PPO | Source: Ambulatory Visit | Attending: Internal Medicine | Admitting: Internal Medicine

## 2021-09-28 DIAGNOSIS — I428 Other cardiomyopathies: Secondary | ICD-10-CM | POA: Insufficient documentation

## 2021-09-28 DIAGNOSIS — I110 Hypertensive heart disease with heart failure: Secondary | ICD-10-CM | POA: Insufficient documentation

## 2021-09-28 DIAGNOSIS — Z86718 Personal history of other venous thrombosis and embolism: Secondary | ICD-10-CM | POA: Diagnosis not present

## 2021-09-28 DIAGNOSIS — I5022 Chronic systolic (congestive) heart failure: Secondary | ICD-10-CM | POA: Insufficient documentation

## 2021-09-28 LAB — ECHOCARDIOGRAM COMPLETE
Area-P 1/2: 2.73 cm2
Calc EF: 43.4 %
S' Lateral: 4.4 cm
Single Plane A2C EF: 44 %
Single Plane A4C EF: 42.8 %

## 2021-09-28 NOTE — Progress Notes (Signed)
  Echocardiogram 2D Echocardiogram has been performed.  [name] 09/28/2021, 9:48 AM

## 2021-09-30 ENCOUNTER — Other Ambulatory Visit (HOSPITAL_COMMUNITY): Payer: Self-pay

## 2021-10-24 ENCOUNTER — Other Ambulatory Visit (HOSPITAL_COMMUNITY): Payer: Self-pay

## 2021-11-11 ENCOUNTER — Other Ambulatory Visit (HOSPITAL_COMMUNITY): Payer: Self-pay

## 2021-11-29 ENCOUNTER — Other Ambulatory Visit: Payer: Self-pay | Admitting: Family Medicine

## 2021-11-30 ENCOUNTER — Other Ambulatory Visit (HOSPITAL_COMMUNITY): Payer: Self-pay

## 2021-11-30 MED ORDER — ATORVASTATIN CALCIUM 20 MG PO TABS
20.0000 mg | ORAL_TABLET | Freq: Every day | ORAL | 0 refills | Status: DC
  Filled 2021-11-30: qty 90, 90d supply, fill #0

## 2021-12-13 ENCOUNTER — Other Ambulatory Visit (HOSPITAL_COMMUNITY): Payer: Self-pay

## 2021-12-15 ENCOUNTER — Ambulatory Visit (INDEPENDENT_AMBULATORY_CARE_PROVIDER_SITE_OTHER): Payer: BC Managed Care – PPO | Admitting: Family Medicine

## 2021-12-15 ENCOUNTER — Other Ambulatory Visit (HOSPITAL_COMMUNITY): Payer: Self-pay

## 2021-12-15 ENCOUNTER — Encounter: Payer: Self-pay | Admitting: Family Medicine

## 2021-12-15 VITALS — BP 122/70 | HR 73 | Temp 97.3°F | Ht 69.0 in | Wt 229.2 lb

## 2021-12-15 DIAGNOSIS — Z0000 Encounter for general adult medical examination without abnormal findings: Secondary | ICD-10-CM | POA: Diagnosis not present

## 2021-12-15 DIAGNOSIS — Z125 Encounter for screening for malignant neoplasm of prostate: Secondary | ICD-10-CM

## 2021-12-15 DIAGNOSIS — Z1211 Encounter for screening for malignant neoplasm of colon: Secondary | ICD-10-CM

## 2021-12-15 DIAGNOSIS — E785 Hyperlipidemia, unspecified: Secondary | ICD-10-CM

## 2021-12-15 DIAGNOSIS — I10 Essential (primary) hypertension: Secondary | ICD-10-CM | POA: Diagnosis not present

## 2021-12-15 LAB — COMPREHENSIVE METABOLIC PANEL
ALT: 23 U/L (ref 0–53)
AST: 18 U/L (ref 0–37)
Albumin: 4.4 g/dL (ref 3.5–5.2)
Alkaline Phosphatase: 36 U/L — ABNORMAL LOW (ref 39–117)
BUN: 14 mg/dL (ref 6–23)
CO2: 25 mEq/L (ref 19–32)
Calcium: 9.3 mg/dL (ref 8.4–10.5)
Chloride: 103 mEq/L (ref 96–112)
Creatinine, Ser: 1.02 mg/dL (ref 0.40–1.50)
GFR: 86.31 mL/min (ref >60.00)
Glucose, Bld: 89 mg/dL (ref 70–99)
Potassium: 4.5 mEq/L (ref 3.5–5.1)
Sodium: 137 mEq/L (ref 135–145)
Total Bilirubin: 0.6 mg/dL (ref 0.2–1.2)
Total Protein: 6.9 g/dL (ref 6.0–8.3)

## 2021-12-15 LAB — CBC WITH DIFFERENTIAL/PLATELET
Basophils Absolute: 0 10*3/uL (ref 0.0–0.1)
Basophils Relative: 0.6 % (ref 0.0–3.0)
Eosinophils Absolute: 0.1 10*3/uL (ref 0.0–0.7)
Eosinophils Relative: 2.1 % (ref 0.0–5.0)
HCT: 38.6 % — ABNORMAL LOW (ref 39.0–52.0)
Hemoglobin: 13.2 g/dL (ref 13.0–17.0)
Lymphocytes Relative: 30.1 % (ref 12.0–46.0)
Lymphs Abs: 1.8 10*3/uL (ref 0.7–4.0)
MCHC: 34.2 g/dL (ref 30.0–36.0)
MCV: 92.8 fl (ref 78.0–100.0)
Monocytes Absolute: 0.4 10*3/uL (ref 0.1–1.0)
Monocytes Relative: 7.3 % (ref 3.0–12.0)
Neutro Abs: 3.5 10*3/uL (ref 1.4–7.7)
Neutrophils Relative %: 59.9 % (ref 43.0–77.0)
Platelets: 276 10*3/uL (ref 150.0–400.0)
RBC: 4.17 Mil/uL — ABNORMAL LOW (ref 4.22–5.81)
RDW: 12.4 % (ref 11.5–15.5)
WBC: 5.9 10*3/uL (ref 4.0–10.5)

## 2021-12-15 LAB — LIPID PANEL
Cholesterol: 103 mg/dL (ref 0–200)
HDL: 18.3 mg/dL — ABNORMAL LOW (ref >39.00)
LDL Cholesterol: 54 mg/dL (ref 0–99)
NonHDL: 84.26
Total CHOL/HDL Ratio: 6
Triglycerides: 149 mg/dL (ref 0.0–149.0)
VLDL: 29.8 mg/dL (ref 0.0–40.0)

## 2021-12-15 LAB — TSH: TSH: 1.54 u[IU]/mL (ref 0.35–5.50)

## 2021-12-15 LAB — PSA: PSA: 0.31 ng/mL (ref 0.10–4.00)

## 2021-12-15 MED ORDER — ATORVASTATIN CALCIUM 20 MG PO TABS
20.0000 mg | ORAL_TABLET | Freq: Every day | ORAL | 1 refills | Status: DC
Start: 2021-12-15 — End: 2022-11-07
  Filled 2021-12-15 – 2022-04-06 (×2): qty 90, 90d supply, fill #0
  Filled 2022-07-21: qty 90, 90d supply, fill #1

## 2021-12-15 NOTE — Patient Instructions (Signed)
Follow up in 6 months to recheck BP and cholesterol We'll notify you of your lab results and make any changes if needed Continue to work on healthy diet and regular exercise- you're doing great!!! We'll call you to schedule your colonoscopy Call with any questions or concerns Stay Safe!  Stay Healthy! Happy Fall!!!

## 2021-12-15 NOTE — Assessment & Plan Note (Signed)
Pt's PE WNL.  He is down 11 lbs.  Applauded his efforts.  UTD on Tdap.  Due for colonoscopy- referral placed. Check labs.  Anticipatory guidance provided.

## 2021-12-15 NOTE — Progress Notes (Signed)
   Subjective:    Patient ID: [name], male    DOB: 12-07-1970, 51 y.o.   MRN: [id number]  HPI CPE- due for colonoscopy.  UTD on Tdap.  Patient Care Team    Relationship Specialty Notifications Start End  Midge Minium, MD PCP - General Family Medicine  08/06/13   Bensimhon, Shaune Pascal, MD PCP - [facility]  02/18/21     Health Maintenance  Topic Date Due   COVID-19 Vaccine (2 - Booster for Janssen series) 12/31/2021 (Originally 04/20/2020)   INFLUENZA VACCINE  06/19/2022 (Originally 10/18/2021)   COLONOSCOPY (Pts 45-31yrs Insurance coverage will need to be confirmed)  12/16/2022 (Originally 06/16/2017)   TETANUS/TDAP  10/31/2028   Hepatitis C Screening  Completed   HPV VACCINES  Aged Out   HIV Screening  Discontinued     Review of Systems Patient reports no vision/hearing changes, anorexia, fever ,adenopathy, persistant/recurrent hoarseness, swallowing issues, chest pain, palpitations, edema, persistant/recurrent cough, hemoptysis, dyspnea (rest,exertional, paroxysmal nocturnal), gastrointestinal  bleeding (melena, rectal bleeding), abdominal pain, excessive heart burn, GU symptoms (dysuria, hematuria, voiding/incontinence issues) syncope, focal weakness, memory loss, numbness & tingling, skin/hair/nail changes, depression, anxiety, abnormal bruising/bleeding, musculoskeletal symptoms/signs.     Objective:   Physical Exam General Appearance:    Alert, cooperative, no distress, appears stated age  Head:    Normocephalic, without obvious abnormality, atraumatic  Eyes:    PERRL, conjunctiva/corneas clear, EOM's intact both eyes       Ears:    Normal TM's and external ear canals, both ears  Nose:   Nares normal, septum midline, mucosa normal, no drainage   or sinus tenderness  Throat:   Lips, mucosa, and tongue normal; teeth and gums normal  Neck:   Supple, symmetrical, trachea midline, no adenopathy;       thyroid:  No enlargement/tenderness/nodules   Back:     Symmetric, no curvature, ROM normal, no CVA tenderness  Lungs:     Clear to auscultation bilaterally, respirations unlabored  Chest wall:    No tenderness or deformity  Heart:    Regular rate and rhythm, S1 and S2 normal, no murmur, rub   or gallop  Abdomen:     Soft, non-tender, bowel sounds active all four quadrants,    no masses, no organomegaly  Genitalia:    deferred  Rectal:    Extremities:   Extremities normal, atraumatic, no cyanosis or edema  Pulses:   2+ and symmetric all extremities  Skin:   Skin color, texture, turgor normal, no rashes or lesions  Lymph nodes:   Cervical, supraclavicular, and axillary nodes normal  Neurologic:   CNII-XII intact. Normal strength, sensation and reflexes      throughout          Assessment & Plan:

## 2021-12-16 NOTE — Progress Notes (Signed)
Informed pt of lab results  

## 2021-12-29 ENCOUNTER — Other Ambulatory Visit (HOSPITAL_COMMUNITY): Payer: Self-pay

## 2022-01-03 DIAGNOSIS — L812 Freckles: Secondary | ICD-10-CM | POA: Diagnosis not present

## 2022-01-03 DIAGNOSIS — L570 Actinic keratosis: Secondary | ICD-10-CM | POA: Diagnosis not present

## 2022-01-03 DIAGNOSIS — D225 Melanocytic nevi of trunk: Secondary | ICD-10-CM | POA: Diagnosis not present

## 2022-01-10 ENCOUNTER — Other Ambulatory Visit (HOSPITAL_COMMUNITY): Payer: Self-pay | Admitting: Internal Medicine

## 2022-01-10 ENCOUNTER — Other Ambulatory Visit (HOSPITAL_COMMUNITY): Payer: Self-pay

## 2022-01-10 MED ORDER — FLECAINIDE ACETATE 50 MG PO TABS
75.0000 mg | ORAL_TABLET | Freq: Two times a day (BID) | ORAL | 0 refills | Status: DC
  Filled 2022-01-10: qty 90, 30d supply, fill #0

## 2022-02-04 ENCOUNTER — Other Ambulatory Visit (HOSPITAL_COMMUNITY): Payer: Self-pay | Admitting: Internal Medicine

## 2022-02-06 ENCOUNTER — Other Ambulatory Visit (HOSPITAL_COMMUNITY): Payer: Self-pay

## 2022-02-06 MED ORDER — ENTRESTO 97-103 MG PO TABS
1.0000 | ORAL_TABLET | Freq: Two times a day (BID) | ORAL | 3 refills | Status: DC
  Filled 2022-02-06: qty 60, 30d supply, fill #0
  Filled 2022-03-08: qty 60, 30d supply, fill #1
  Filled 2022-04-13: qty 60, 30d supply, fill #2
  Filled 2022-05-09: qty 60, 30d supply, fill #3

## 2022-02-17 ENCOUNTER — Other Ambulatory Visit (HOSPITAL_COMMUNITY): Payer: Self-pay

## 2022-02-17 ENCOUNTER — Other Ambulatory Visit (HOSPITAL_COMMUNITY): Payer: Self-pay | Admitting: Internal Medicine

## 2022-02-23 ENCOUNTER — Other Ambulatory Visit (HOSPITAL_COMMUNITY): Payer: Self-pay

## 2022-02-23 MED ORDER — FENOFIBRATE 160 MG PO TABS
160.0000 mg | ORAL_TABLET | Freq: Every day | ORAL | 1 refills | Status: DC
  Filled 2022-02-23: qty 90, 90d supply, fill #0
  Filled 2022-06-13: qty 90, 90d supply, fill #1

## 2022-02-27 ENCOUNTER — Other Ambulatory Visit (HOSPITAL_COMMUNITY): Payer: Self-pay

## 2022-02-27 ENCOUNTER — Other Ambulatory Visit (HOSPITAL_COMMUNITY): Payer: Self-pay | Admitting: Internal Medicine

## 2022-02-27 MED ORDER — FLECAINIDE ACETATE 50 MG PO TABS
75.0000 mg | ORAL_TABLET | Freq: Two times a day (BID) | ORAL | 0 refills | Status: DC
  Filled 2022-02-27: qty 90, 30d supply, fill #0

## 2022-04-07 ENCOUNTER — Other Ambulatory Visit (HOSPITAL_COMMUNITY): Payer: Self-pay

## 2022-04-13 ENCOUNTER — Other Ambulatory Visit (HOSPITAL_COMMUNITY): Payer: Self-pay

## 2022-04-13 ENCOUNTER — Other Ambulatory Visit (HOSPITAL_COMMUNITY): Payer: Self-pay | Admitting: Internal Medicine

## 2022-04-13 MED ORDER — SPIRONOLACTONE 25 MG PO TABS
25.0000 mg | ORAL_TABLET | Freq: Every day | ORAL | 3 refills | Status: DC
  Filled 2022-04-13: qty 90, 90d supply, fill #0
  Filled 2022-08-11: qty 90, 90d supply, fill #1
  Filled 2022-11-07: qty 90, 90d supply, fill #2
  Filled 2023-02-27: qty 90, 90d supply, fill #3

## 2022-04-13 MED ORDER — FLECAINIDE ACETATE 50 MG PO TABS
75.0000 mg | ORAL_TABLET | Freq: Two times a day (BID) | ORAL | 0 refills | Status: DC
  Filled 2022-04-13: qty 90, 30d supply, fill #0

## 2022-05-02 ENCOUNTER — Encounter (HOSPITAL_COMMUNITY): Payer: Self-pay | Admitting: Internal Medicine

## 2022-05-02 ENCOUNTER — Ambulatory Visit (HOSPITAL_COMMUNITY)
Admission: RE | Admit: 2022-05-02 | Discharge: 2022-05-02 | Disposition: A | Discharge status: Home / Self Care | Payer: BC Managed Care – PPO | Source: Ambulatory Visit | Attending: Internal Medicine | Admitting: Internal Medicine

## 2022-05-02 VITALS — BP 94/60 | HR 68 | Wt 237.0 lb

## 2022-05-02 DIAGNOSIS — G4733 Obstructive sleep apnea (adult) (pediatric): Secondary | ICD-10-CM | POA: Diagnosis not present

## 2022-05-02 DIAGNOSIS — I472 Ventricular tachycardia, unspecified: Secondary | ICD-10-CM | POA: Diagnosis not present

## 2022-05-02 DIAGNOSIS — I5022 Chronic systolic (congestive) heart failure: Secondary | ICD-10-CM

## 2022-05-02 DIAGNOSIS — I110 Hypertensive heart disease with heart failure: Secondary | ICD-10-CM | POA: Diagnosis not present

## 2022-05-02 DIAGNOSIS — I428 Other cardiomyopathies: Secondary | ICD-10-CM | POA: Insufficient documentation

## 2022-05-02 DIAGNOSIS — Z8249 Family history of ischemic heart disease and other diseases of the circulatory system: Secondary | ICD-10-CM | POA: Insufficient documentation

## 2022-05-02 DIAGNOSIS — E785 Hyperlipidemia, unspecified: Secondary | ICD-10-CM | POA: Diagnosis not present

## 2022-05-02 DIAGNOSIS — E669 Obesity, unspecified: Secondary | ICD-10-CM | POA: Insufficient documentation

## 2022-05-02 DIAGNOSIS — I493 Ventricular premature depolarization: Secondary | ICD-10-CM | POA: Diagnosis not present

## 2022-05-02 DIAGNOSIS — Z79899 Other long term (current) drug therapy: Secondary | ICD-10-CM | POA: Insufficient documentation

## 2022-05-02 LAB — BASIC METABOLIC PANEL
Anion gap: 10 (ref 5–15)
BUN: 14 mg/dL (ref 6–20)
CO2: 22 mmol/L (ref 22–32)
Calcium: 9 mg/dL (ref 8.9–10.3)
Chloride: 104 mmol/L (ref 98–111)
Creatinine, Ser: 0.97 mg/dL (ref 0.61–1.24)
GFR, Estimated: >60 mL/min (ref >60)
Glucose, Bld: 104 mg/dL — ABNORMAL HIGH (ref 70–99)
Potassium: 4 mmol/L (ref 3.5–5.1)
Sodium: 136 mmol/L (ref 135–145)

## 2022-05-02 LAB — CBC
HCT: 45.4 % (ref 39.0–52.0)
Hemoglobin: 15.9 g/dL (ref 13.0–17.0)
MCH: 31.1 pg (ref 26.0–34.0)
MCHC: 35 g/dL (ref 30.0–36.0)
MCV: 88.8 fL (ref 80.0–100.0)
Platelets: 244 10*3/uL (ref 150–400)
RBC: 5.11 MIL/uL (ref 4.22–5.81)
RDW: 11.4 % — ABNORMAL LOW (ref 11.5–15.5)
WBC: 7 10*3/uL (ref 4.0–10.5)
nRBC: 0 % (ref 0.0–0.2)

## 2022-05-02 NOTE — Addendum Note (Signed)
Encounter addended by: Jerl Mina, RN on: 05/02/2022 10:13 AM  Actions taken: Order list changed, Diagnosis association updated, Clinical Note Signed, Charge Capture section accepted

## 2022-05-02 NOTE — Patient Instructions (Signed)
There has been no changes to your medications.  Labs done today, your results will be available in MyChart, we will contact you for abnormal readings.  Your physician has requested that you have a cardiac MRI. Cardiac MRI uses a computer to create images of your heart as its beating, producing both still and moving pictures of your heart and major blood vessels. For further information please visit [email or web address] Please follow the instruction sheet given to you today for more information. ONCE APPROVED BY INSURANCE YOU WILL BE CALLED TO HAVE THE TEST ARRANGED.    Your physician recommends that you schedule a follow-up appointment in: 6 months (August 2024)  ** please call the office in May to arrange your follow up appointment, **  If you have any questions or concerns before your next appointment please send [place] a message through [place] or call our office at [phone].    TO LEAVE A MESSAGE FOR THE NURSE SELECT OPTION 2, PLEASE LEAVE A MESSAGE INCLUDING: YOUR NAME DATE OF BIRTH CALL BACK NUMBER REASON FOR CALL**this is important as we prioritize the call backs  YOU WILL RECEIVE A CALL BACK THE SAME DAY AS LONG AS YOU CALL BEFORE 4:00 PM  At the [facility], you and your health needs are our priority. As part of our continuing mission to provide you with exceptional heart care, we have created designated Provider Care Teams. These Care Teams include your primary Cardiologist (physician) and Advanced Practice Providers (APPs- Physician Assistants and Nurse Practitioners) who all work together to provide you with the care you need, when you need it.   You may see any of the following providers on your designated Care Team at your next follow up: Dr Glori Bickers Dr Loralie Champagne Dr. Roxana Hires, NP Lyda Jester, [place] [facility] [place], [place] Forestine Na, NP Audry Riles, PharmD   Please be sure to bring in all your medications  bottles to every appointment.    Thank you for choosing [facility]

## 2022-05-02 NOTE — Progress Notes (Signed)
ADVANCED HF CLINIC NOTE   Patient ID: [name], male   DOB: [birth year], 52 y.o.   MRN: [id number] PCP: Dr Birdie Riddle Pulmonary: Dr Elsworth Soho  HPI: [name] is a 52 y.o. male with a history of ETOH consumption, obesity, and systolic heart failure due to NICM (onset 3/15)  Admitted 3/15 with acute systolic heart failure. Echo EF 10% with global HK, large LV thrombus and severe RV dysfunction.  LHC 05/2013 normal cors. Required short course of Milrinone.  cMRI 8/15 EF 31% no scar  Echo 12/22 EF 45-50%  Zio 12/22:  - Isolated PVCs were frequent (16.8%, [id number]), - mostly one morphology  - PVCs couplets were occasional (4.3%, [id number])  Unable to tolerate mexilitene. In 3/23 started on flecainide  Echo 7/23 EF 35-40%  Here for f/u. Feels great. Working [employer]. No SOB, orthopnea or PND  Tolerating flecainide.    Studies:  ECHO 06/01/13 EF 10%  ECHO 09/03/13  EF 25-30% RV normal No LV thrombus ECHO 5/16 EF 40-45%,  Echo 2/20 EF 45-50%  CPX (2/16) with peak VO2 27.4, VE/VCO2 slope 27.4, RER 1.28 => low normal functional capacity.                                                                                                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 Prior heavy ETOH, now rare.   FH: Mom: HTN, DM       Grandmother: HTN, DM, heart disease   Review of systems complete and found to be negative unless listed in HPI.    Past Medical History:  Diagnosis Date   Athlete's foot    CHF (congestive heart failure) ([place])    a) NICM b) ECHO (05/2013) EF 10%, diff HK, large mural thrombus, mild MR, LA sev. dilated, RA mildly dilated, mod TR c) Swan GC (06/02/13): CVP 19, PA 41/25 (33), PCWP 29, Thermo CO/CI 2.4 / 1.1 d) cMRI EF 31%, no definite myocardial delayed enhancement    Hypertension    NICM (nonischemic cardiomyopathy) ([place])    a) LHC (06/04/2013): normal coronary arteries    Current Outpatient Medications  Medication Sig Dispense  Refill   aspirin EC 81 MG tablet Take 81 mg by mouth daily.     atorvastatin (LIPITOR) 20 MG tablet Take 1 tablet (20 mg total) by mouth daily. 90 tablet 1   carvedilol (COREG) 25 MG tablet Take 1 tablet (25 mg total) by mouth 2 (two) times daily with a meal. 180 tablet 3   fenofibrate 160 MG tablet Take 1 tablet (160 mg total) by mouth daily. 90 tablet 1   flecainide (TAMBOCOR) 50 MG tablet Take 1 & 1/2 tablets (75 mg total) by mouth 2 (two) times daily. 90 tablet 0   sacubitril-valsartan (ENTRESTO) 97-103 MG Take 1 tablet by mouth 2 (two) times daily. 60 tablet 3   spironolactone (ALDACTONE) 25 MG tablet Take 1 tablet (25 mg total) by mouth daily. 90 tablet 3   No current facility-administered medications for this encounter.   Vitals:  05/02/22 0922  BP: 94/60  Pulse: 68  SpO2: 97%  Weight: 107.5 kg (237 lb)      Wt Readings from Last 3 Encounters:  05/02/22 107.5 kg (237 lb)  12/15/21 104 kg (229 lb 3.2 oz)  08/18/21 109 kg (240 lb 3.2 oz)    PHYSICAL EXAM: General:  Well appearing. No resp difficulty HEENT: normal Neck: supple. no JVD. Carotids 2+ bilat; no bruits. No lymphadenopathy or thryomegaly appreciated. Cor: PMI nondisplaced. Regular rate & rhythm. No rubs, gallops or murmurs. Lungs: clear Abdomen: soft, nontender, nondistended. No hepatosplenomegaly. No bruits or masses. Good bowel sounds. Extremities: no cyanosis, clubbing, rash, edema Neuro: alert & orientedx3, cranial nerves grossly intact. moves all 4 extremities w/o difficulty. Affect pleasant  ECG: NSR 80 anterolateral Qs  ASSESSMENT & PLAN:  1. Chronic Systolic Heart Failure: Nonischemic cardiomyopathy (ETOH may have played a role), echo with EF 10% in 3/15. ECHO 07/2014 40-45%.  - cMRI 10/2013 with LVEF 31%, Normal RV, No LGE.  - Echo 7/18 EF 35-40%  - Echo 2/20 EF 45-50% - Echo 12/22 EF 45-50% - Echo 7/23 EF 35-40% (started flecainide 3/23 for PVC suppression) - NYHA I - Volume looks good - Not on  diuretics - Continue spiro 25 mg daily - Continue coreg 25 mg BID - Continue Entresto to 97/103 mg BID.  - Can consider SGLT2i as needed but currently asymptomatic - Started flecainide 75 bid in 3/23. PVCs appears suppressed - Will repeat cMRI to reassess EF. IF EF remains < 45% will need to consider stopping flecainide.  - If EF normalizes can consider stopping flecainide and considering ablation 2. NSVT:  - Resolved. EF improved to out of ICD range.  3. LV thrombus:  - Resolved  4. Mild OSA:  - Per Dr Elsworth Soho mild sleep apnea in 5/15 No intervention needed.  5. Hyperlipidemia:  - Continue statin.   - PCP following lipids.  6. Frequent PVCs. - high burden on Zio  - Plan as above. - Started Flecainide 3/23. - No PVCs on ECG today  Glori Bickers, MD  05/02/2022

## 2022-05-09 ENCOUNTER — Other Ambulatory Visit (HOSPITAL_COMMUNITY): Payer: Self-pay

## 2022-05-09 ENCOUNTER — Other Ambulatory Visit: Payer: Self-pay

## 2022-05-09 ENCOUNTER — Other Ambulatory Visit (HOSPITAL_COMMUNITY): Payer: Self-pay | Admitting: Internal Medicine

## 2022-05-09 MED ORDER — FLECAINIDE ACETATE 50 MG PO TABS
75.0000 mg | ORAL_TABLET | Freq: Two times a day (BID) | ORAL | 0 refills | Status: DC
  Filled 2022-05-09: qty 90, 30d supply, fill #0

## 2022-06-13 ENCOUNTER — Other Ambulatory Visit (HOSPITAL_COMMUNITY): Payer: Self-pay | Admitting: Internal Medicine

## 2022-06-14 ENCOUNTER — Other Ambulatory Visit (HOSPITAL_COMMUNITY): Payer: Self-pay

## 2022-06-14 MED ORDER — ENTRESTO 97-103 MG PO TABS
1.0000 | ORAL_TABLET | Freq: Two times a day (BID) | ORAL | 3 refills | Status: DC
  Filled 2022-06-14: qty 60, 30d supply, fill #0
  Filled 2022-07-21: qty 60, 30d supply, fill #1
  Filled 2022-09-01: qty 60, 30d supply, fill #2
  Filled 2022-10-02: qty 60, 30d supply, fill #3

## 2022-06-14 MED ORDER — FLECAINIDE ACETATE 50 MG PO TABS
75.0000 mg | ORAL_TABLET | Freq: Two times a day (BID) | ORAL | 0 refills | Status: DC
  Filled 2022-06-14: qty 90, 30d supply, fill #0

## 2022-06-15 ENCOUNTER — Ambulatory Visit: Payer: BC Managed Care – PPO | Admitting: Family Medicine

## 2022-06-20 ENCOUNTER — Other Ambulatory Visit (HOSPITAL_COMMUNITY): Payer: Self-pay | Admitting: Internal Medicine

## 2022-06-20 ENCOUNTER — Other Ambulatory Visit (HOSPITAL_COMMUNITY): Payer: Self-pay

## 2022-06-20 MED ORDER — FLECAINIDE ACETATE 50 MG PO TABS
75.0000 mg | ORAL_TABLET | Freq: Two times a day (BID) | ORAL | 0 refills | Status: DC
  Filled 2022-06-20 – 2022-07-21 (×3): qty 90, 30d supply, fill #0

## 2022-06-20 MED ORDER — FENOFIBRATE 160 MG PO TABS
160.0000 mg | ORAL_TABLET | Freq: Every day | ORAL | 1 refills | Status: DC
  Filled 2022-06-20 – 2022-09-11 (×2): qty 90, 90d supply, fill #0
  Filled 2023-01-09: qty 90, 90d supply, fill #1

## 2022-06-21 ENCOUNTER — Ambulatory Visit: Payer: BC Managed Care – PPO | Admitting: Family Medicine

## 2022-06-23 ENCOUNTER — Other Ambulatory Visit (HOSPITAL_COMMUNITY): Payer: Self-pay

## 2022-06-28 ENCOUNTER — Encounter: Payer: Self-pay | Admitting: Family Medicine

## 2022-06-28 ENCOUNTER — Ambulatory Visit (INDEPENDENT_AMBULATORY_CARE_PROVIDER_SITE_OTHER): Payer: BC Managed Care – PPO | Admitting: Family Medicine

## 2022-06-28 VITALS — BP 130/70 | HR 67 | Temp 97.7°F | Resp 17 | Ht 69.0 in | Wt 247.0 lb

## 2022-06-28 DIAGNOSIS — I10 Essential (primary) hypertension: Secondary | ICD-10-CM | POA: Diagnosis not present

## 2022-06-28 DIAGNOSIS — E785 Hyperlipidemia, unspecified: Secondary | ICD-10-CM

## 2022-06-28 LAB — LIPID PANEL
Cholesterol: 132 mg/dL (ref 0–200)
HDL: 20.3 mg/dL — ABNORMAL LOW (ref >39.00)
NonHDL: 111.97
Total CHOL/HDL Ratio: 7
Triglycerides: 260 mg/dL — ABNORMAL HIGH (ref 0.0–149.0)
VLDL: 52 mg/dL — ABNORMAL HIGH (ref 0.0–40.0)

## 2022-06-28 LAB — BASIC METABOLIC PANEL
BUN: 16 mg/dL (ref 6–23)
CO2: 24 mEq/L (ref 19–32)
Calcium: 9.2 mg/dL (ref 8.4–10.5)
Chloride: 105 mEq/L (ref 96–112)
Creatinine, Ser: 0.93 mg/dL (ref 0.40–1.50)
GFR: 96.07 mL/min (ref >60.00)
Glucose, Bld: 90 mg/dL (ref 70–99)
Potassium: 4.2 mEq/L (ref 3.5–5.1)
Sodium: 137 mEq/L (ref 135–145)

## 2022-06-28 LAB — CBC WITH DIFFERENTIAL/PLATELET
Basophils Absolute: 0 10*3/uL (ref 0.0–0.1)
Basophils Relative: 0.5 % (ref 0.0–3.0)
Eosinophils Absolute: 0.2 10*3/uL (ref 0.0–0.7)
Eosinophils Relative: 2.8 % (ref 0.0–5.0)
HCT: 42.5 % (ref 39.0–52.0)
Hemoglobin: 14.7 g/dL (ref 13.0–17.0)
Lymphocytes Relative: 34.7 % (ref 12.0–46.0)
Lymphs Abs: 2.3 10*3/uL (ref 0.7–4.0)
MCHC: 34.7 g/dL (ref 30.0–36.0)
MCV: 91.7 fl (ref 78.0–100.0)
Monocytes Absolute: 0.5 10*3/uL (ref 0.1–1.0)
Monocytes Relative: 8 % (ref 3.0–12.0)
Neutro Abs: 3.6 10*3/uL (ref 1.4–7.7)
Neutrophils Relative %: 54 % (ref 43.0–77.0)
Platelets: 221 10*3/uL (ref 150.0–400.0)
RBC: 4.63 Mil/uL (ref 4.22–5.81)
RDW: 12.9 % (ref 11.5–15.5)
WBC: 6.6 10*3/uL (ref 4.0–10.5)

## 2022-06-28 LAB — HEPATIC FUNCTION PANEL
ALT: 34 U/L (ref 0–53)
AST: 23 U/L (ref 0–37)
Albumin: 4.4 g/dL (ref 3.5–5.2)
Alkaline Phosphatase: 36 U/L — ABNORMAL LOW (ref 39–117)
Bilirubin, Direct: 0.1 mg/dL (ref 0.0–0.3)
Total Bilirubin: 0.7 mg/dL (ref 0.2–1.2)
Total Protein: 6.8 g/dL (ref 6.0–8.3)

## 2022-06-28 LAB — TSH: TSH: 3.22 u[IU]/mL (ref 0.35–5.50)

## 2022-06-28 LAB — HEMOGLOBIN A1C: Hgb A1c MFr Bld: 5.5 % (ref 4.6–6.5)

## 2022-06-28 LAB — LDL CHOLESTEROL, DIRECT: Direct LDL: 76 mg/dL

## 2022-06-28 NOTE — Assessment & Plan Note (Signed)
Chronic problem.  Currently well controlled on Coreg 25mg  BID, Entresto 97-103mg  BID, and Spironolactone 25mg  daily.  Asymptomatic.  Check labs due to ARB and diuretic use but no anticipated med changes.  Will follow.

## 2022-06-28 NOTE — Patient Instructions (Signed)
Schedule your complete physical in 6 months We'll notify you of your lab results and make any changes if needed Continue to work on healthy diet and regular exercise- you can do it! I love that you're walking!  Keep it up! Try and limit your carbs- increase your fruits and veggies, protein Call with any questions or concerns Stay Safe!  Stay Healthy! Happy Spring!!

## 2022-06-28 NOTE — Assessment & Plan Note (Signed)
New.  Pt has gained 18 lbs since Sept and now that his BMI is 36.48- coupled w/ his other medical issues- this qualifies as morbidly obese.  Encouraged low carb diet, regular physical activity.  Check labs to risk stratify.  Will follow.

## 2022-06-28 NOTE — Assessment & Plan Note (Signed)
Chronic problem.  On Lipitor 20mg daily and Fenofibrate 160mg daily w/o difficulty.  Check labs.  Adjust meds prn  ?

## 2022-06-28 NOTE — Progress Notes (Signed)
   Subjective:    Patient ID: [name], male    DOB: 1970/04/02, 52 y.o.   MRN: [id number]  HPI HTN- chronic problem, on Coreg 25mg  BID, on Entresto 97/103 BID, and spironolactone 25mg  daily.  No CP, SOB, HA's, visual changes, edema.  Hyperlipidemia- on Lipitor 20mg  daily and Fenofibrate 160mg  daily.  No abd pain, N/V.  Obesity- pt has gained 18 lbs since September.  Pt has started walking regularly.  Admits he has not been eating well due to holidays and his birthday.   Review of Systems For ROS see HPI     Objective:   Physical Exam Vitals reviewed.  Constitutional:      General: He is not in acute distress.    Appearance: Normal appearance. He is well-developed. He is obese. He is not ill-appearing.  HENT:     Head: Normocephalic and atraumatic.  Eyes:     Extraocular Movements: Extraocular movements intact.     Conjunctiva/sclera: Conjunctivae normal.     Pupils: Pupils are equal, round, and reactive to light.  Neck:     Thyroid: No thyromegaly.  Cardiovascular:     Rate and Rhythm: Normal rate and regular rhythm.     Pulses: Normal pulses.     Heart sounds: Normal heart sounds. No murmur heard. Pulmonary:     Effort: Pulmonary effort is normal. No respiratory distress.     Breath sounds: Normal breath sounds.  Abdominal:     General: Bowel sounds are normal. There is no distension.     Palpations: Abdomen is soft.  Musculoskeletal:     Cervical back: Normal range of motion and neck supple.     Right lower leg: No edema.     Left lower leg: No edema.  Lymphadenopathy:     Cervical: No cervical adenopathy.  Skin:    General: Skin is warm and dry.  Neurological:     General: No focal deficit present.     Mental Status: He is alert and oriented to person, place, and time.     Cranial Nerves: No cranial nerve deficit.  Psychiatric:        Mood and Affect: Mood normal.        Behavior: Behavior normal.           Assessment & Plan:

## 2022-07-04 ENCOUNTER — Telehealth: Payer: Self-pay | Admitting: *Deleted

## 2022-07-04 NOTE — Telephone Encounter (Signed)
CARDIAC MRI [facility]   Order ID: [id number]       Authorized  Approval Valid Through: 07/04/2022 - 08/02/2022

## 2022-07-21 ENCOUNTER — Other Ambulatory Visit (HOSPITAL_COMMUNITY): Payer: Self-pay

## 2022-07-27 ENCOUNTER — Telehealth (HOSPITAL_COMMUNITY): Payer: Self-pay | Admitting: *Deleted

## 2022-07-27 NOTE — Telephone Encounter (Signed)
Reaching out to patient to offer assistance regarding upcoming cardiac imaging study; pt verbalizes understanding of appt date/time, parking situation and where to check in, and verified current allergies; name and call back number provided for further questions should they arise  Larey Brick RN Navigator Cardiac Imaging [facility] [phone] office [phone] cell  Patient denies metal or claustrophobia. He had a cardiac MRI in 2015 without incident.

## 2022-07-28 ENCOUNTER — Other Ambulatory Visit (HOSPITAL_COMMUNITY): Payer: Self-pay | Admitting: Internal Medicine

## 2022-07-28 ENCOUNTER — Ambulatory Visit (HOSPITAL_COMMUNITY)
Admission: RE | Admit: 2022-07-28 | Discharge: 2022-07-28 | Disposition: A | Discharge status: Home / Self Care | Payer: BC Managed Care – PPO | Source: Ambulatory Visit | Attending: Internal Medicine | Admitting: Internal Medicine

## 2022-07-28 DIAGNOSIS — I5022 Chronic systolic (congestive) heart failure: Secondary | ICD-10-CM

## 2022-07-28 MED ORDER — GADOBUTROL 1 MMOL/ML IV SOLN
10.0000 mL | Freq: Once | INTRAVENOUS | Status: AC | PRN
Start: 2022-07-28 — End: 2022-07-28
  Administered 2022-07-28: 10 mL via INTRAVENOUS

## 2022-08-09 ENCOUNTER — Other Ambulatory Visit: Payer: Self-pay

## 2022-08-09 DIAGNOSIS — Z1211 Encounter for screening for malignant neoplasm of colon: Secondary | ICD-10-CM

## 2022-08-11 ENCOUNTER — Other Ambulatory Visit: Payer: Self-pay

## 2022-08-15 ENCOUNTER — Encounter (HOSPITAL_COMMUNITY): Payer: Self-pay | Admitting: Internal Medicine

## 2022-08-15 ENCOUNTER — Other Ambulatory Visit (HOSPITAL_COMMUNITY): Payer: Self-pay

## 2022-08-15 MED ORDER — CARVEDILOL 25 MG PO TABS
25.0000 mg | ORAL_TABLET | Freq: Two times a day (BID) | ORAL | 3 refills | Status: DC
  Filled 2022-08-16: qty 180, 90d supply, fill #0
  Filled 2023-01-09: qty 180, 90d supply, fill #1
  Filled 2023-04-08: qty 180, 90d supply, fill #2

## 2022-08-16 ENCOUNTER — Other Ambulatory Visit (HOSPITAL_COMMUNITY): Payer: Self-pay

## 2022-09-01 ENCOUNTER — Other Ambulatory Visit: Payer: Self-pay

## 2022-09-07 ENCOUNTER — Encounter: Payer: Self-pay | Admitting: Family Medicine

## 2022-09-11 ENCOUNTER — Other Ambulatory Visit (HOSPITAL_COMMUNITY): Payer: Self-pay | Admitting: Internal Medicine

## 2022-09-11 ENCOUNTER — Other Ambulatory Visit: Payer: Self-pay

## 2022-09-11 ENCOUNTER — Other Ambulatory Visit (HOSPITAL_COMMUNITY): Payer: Self-pay

## 2022-09-11 ENCOUNTER — Telehealth (INDEPENDENT_AMBULATORY_CARE_PROVIDER_SITE_OTHER): Payer: BC Managed Care – PPO | Admitting: Family Medicine

## 2022-09-11 ENCOUNTER — Encounter: Payer: Self-pay | Admitting: Family Medicine

## 2022-09-11 DIAGNOSIS — R051 Acute cough: Secondary | ICD-10-CM | POA: Diagnosis not present

## 2022-09-11 MED ORDER — ALBUTEROL SULFATE HFA 108 (90 BASE) MCG/ACT IN AERS
2.0000 | INHALATION_SPRAY | Freq: Four times a day (QID) | RESPIRATORY_TRACT | 0 refills | Status: AC | PRN
  Filled 2022-09-11: qty 6.7, 25d supply, fill #0

## 2022-09-11 MED ORDER — PREDNISONE 10 MG PO TABS
ORAL_TABLET | ORAL | 0 refills | Status: AC
  Filled 2022-09-11: qty 18, 9d supply, fill #0

## 2022-09-11 MED ORDER — FLECAINIDE ACETATE 50 MG PO TABS
75.0000 mg | ORAL_TABLET | Freq: Two times a day (BID) | ORAL | 0 refills | Status: DC
  Filled 2022-09-11: qty 90, 30d supply, fill #0

## 2022-09-11 NOTE — Progress Notes (Signed)
   Virtual Visit via Video   I connected with patient on 09/11/22 at 11:00 AM EDT by a video enabled telemedicine application and verified that I am speaking with the correct person using two identifiers.  Location patient: Home Location provider: [place], Office Persons participating in the virtual visit: Patient, Provider, CMA Sheryle Hail C)  I discussed the limitations of evaluation and management by telemedicine and the availability of in person appointments. The patient expressed understanding and agreed to proceed.  Subjective:   HPI:   Cough- pt reports has has been coughing for ~1 week.  Since going to golf tournament has had congestion, cough, wheezing.  Denies facial pain/pressure.  No ear pain but + fullness.  Denies SOB.  Pt doesn't feel sick.  ROS:   See pertinent positives and negatives per HPI.  Patient Active Problem List   Diagnosis Date Noted   Morbid obesity (HCC) 06/28/2022   HTN (hypertension) 10/04/2015   Physical exam 03/11/2014   Chronic systolic heart failure (HCC) 06/16/2013   Anxiety state, unspecified 05/23/2013   DERMATOPHYTOSIS OF FOOT 09/27/2009   Hyperlipidemia 04/06/2009    Social History   Tobacco Use   Smoking status: Never   Smokeless tobacco: Never  Substance Use Topics   Alcohol use: No    Comment: occasional    Current Outpatient Medications:    aspirin EC 81 MG tablet, Take 81 mg by mouth daily., Disp: , Rfl:    atorvastatin (LIPITOR) 20 MG tablet, Take 1 tablet (20 mg total) by mouth daily., Disp: 90 tablet, Rfl: 1   carvedilol (COREG) 25 MG tablet, Take 1 tablet (25 mg total) by mouth 2 (two) times daily with a meal., Disp: 180 tablet, Rfl: 3   fenofibrate 160 MG tablet, Take 1 tablet (160 mg total) by mouth daily., Disp: 90 tablet, Rfl: 1   sacubitril-valsartan (ENTRESTO) 97-103 MG, Take 1 tablet by mouth 2 (two) times daily., Disp: 60 tablet, Rfl: 3   spironolactone (ALDACTONE) 25 MG tablet, Take 1 tablet (25 mg  total) by mouth daily., Disp: 90 tablet, Rfl: 3   flecainide (TAMBOCOR) 50 MG tablet, Take 1 & 1/2 tablets (75 mg total) by mouth 2 (two) times daily., Disp: 90 tablet, Rfl: 0  Allergies  Allergen Reactions   Mexiletine Hcl Other (See Comments)    Muscle soreness and fatigue    Objective:   There were no vitals taken for this visit. AAOx3, NAD NCAT, EOMI No obvious CN deficits Coloring WNL Pt is able to speak clearly, coherently without shortness of breath or increased work of breathing.  Thought process is linear.  Mood is appropriate.   Assessment and Plan:   Cough- new.  Pt states sxs started ~1 week ago after going to the [place] [employer] at [employer].  States it was hot and dusty and he has been coughing and congested since.  No fevers.  No body aches or chills.  Denies any other sxs of URI.  Suspect this is airway inflammation.  Will start Prednisone taper and Albuterol as needed for wheezing and cough.  Reviewed supportive care and red flags that should prompt return.  Pt expressed understanding and is in agreement w/ plan.   Neena Rhymes, MD 09/11/2022

## 2022-09-18 ENCOUNTER — Ambulatory Visit (INDEPENDENT_AMBULATORY_CARE_PROVIDER_SITE_OTHER): Payer: BC Managed Care – PPO | Admitting: Family Medicine

## 2022-09-18 ENCOUNTER — Other Ambulatory Visit (HOSPITAL_COMMUNITY): Payer: Self-pay

## 2022-09-18 ENCOUNTER — Encounter: Payer: Self-pay | Admitting: Family Medicine

## 2022-09-18 VITALS — BP 110/64 | HR 88 | Temp 98.2°F | Ht 69.0 in | Wt 232.1 lb

## 2022-09-18 DIAGNOSIS — J069 Acute upper respiratory infection, unspecified: Secondary | ICD-10-CM | POA: Diagnosis not present

## 2022-09-18 MED ORDER — AZITHROMYCIN 250 MG PO TABS
ORAL_TABLET | ORAL | 0 refills | Status: AC
Start: 2022-09-18 — End: 2022-09-23
  Filled 2022-09-18 (×2): qty 6, 5d supply, fill #0

## 2022-09-18 NOTE — Patient Instructions (Addendum)
-  It was a pleasure meeting you and hope you get to feeling better soon! -Recommend to finish taking Prednisone.  -Continue to use Albuterol Inhaler as needed for [facility], cough, and wheezing.  -Prescribed Azithromycin 250mg  tablet, 2 tablets today and one tablet 2-5th day.  -If not improved or symptoms become worse, please follow up.

## 2022-09-18 NOTE — Progress Notes (Signed)
Acute Office Visit   Subjective:  Patient ID: [name], male    DOB: 13-Mar-1971, 52 y.o.   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 Pt reports he tested for COVID and it was negative today. Otc-mucinex this did help some with c/o Denies fever,fatigue,temp     HPI Patient is 52 year old male that presents with  +productive cough-white, thick  +nasal congestion/ drainage-yellow, greenish; varies between thick and thin +SHOB only when coughing  +wheezing   -fever -chest pain  -sore throat  -ear pain or drainage  -headache   Negative home covid test today.   Patient had a telehealth visit on 09/11/2022, dx with acute cough and prescribed Albuterol Inhaler Prednisone. He reports he has one more day of steroid and the Albuterol Inhaler seems to help.  He reports he took OTC Mucinex last night, and Delsym this morning. It has helped some.   ROS See HPI above      Objective:   BP 110/64   Pulse 88   Temp 98.2 F (36.8 C)   Ht 5\' 9"  (1.753 m)   Wt 232 lb 2 oz (105.3 kg)   SpO2 98%   BMI 34.28 kg/m     Physical Exam Vitals reviewed.  Constitutional:      General: He is not in acute distress.    Appearance: Normal appearance. He is obese. He is not ill-appearing, toxic-appearing or diaphoretic.  HENT:     Head: Normocephalic and atraumatic.     Right Ear: Tympanic membrane, ear canal and external ear normal.     Left Ear: Tympanic membrane, ear canal and external ear normal.     Nose:     Right Sinus: No maxillary sinus tenderness or frontal sinus tenderness.     Left Sinus: No maxillary sinus tenderness or frontal sinus tenderness.     Mouth/Throat:     Pharynx: Oropharynx is clear. Uvula midline. No pharyngeal swelling, oropharyngeal exudate, posterior  oropharyngeal erythema or uvula swelling.  Eyes:     General:        Right eye: No discharge.        Left eye: No discharge.     Conjunctiva/sclera: Conjunctivae normal.  Cardiovascular:     Rate and Rhythm: Normal rate and regular rhythm.     Heart sounds: Normal heart sounds. No murmur heard.    No friction rub. No gallop.  Pulmonary:     Effort: Pulmonary effort is normal. No respiratory distress.     Breath sounds: Wheezing and rhonchi present.  Chest:     Comments: Cough present  Musculoskeletal:        General: Normal range of motion.  Lymphadenopathy:     Head:     Right side of head: No submental or submandibular adenopathy.     Left side of head: No submental or submandibular adenopathy.  Skin:    General: Skin is warm and dry.  Neurological:     General: No focal deficit present.     Mental Status: He is alert and oriented to person, place, and time. Mental status is at baseline.  Psychiatric:        Mood and Affect: Mood normal.  Behavior: Behavior normal.        Thought Content: Thought content normal.        Judgment: Judgment normal.       Assessment & Plan:  Upper respiratory tract infection, unspecified type -     Azithromycin; Take 2 tablets on day 1, then 1 tablet daily on days 2 through 5  Dispense: 6 tablet; Refill: 0  -Recommend to finish taking Prednisone.  -Continue to use Albuterol Inhaler as needed for SHOB, cough, and wheezing.  -Prescribed Azithromycin 250mg  tablet, 2 tablets today and one tablet 2-5th day.  -If not improved or symptoms become worse, please follow up.  -Patient expressed understanding and agrees with plan of care.   Zandra Abts, NP

## 2022-10-02 ENCOUNTER — Other Ambulatory Visit: Payer: Self-pay

## 2022-10-09 ENCOUNTER — Other Ambulatory Visit (HOSPITAL_COMMUNITY): Payer: Self-pay | Admitting: Internal Medicine

## 2022-10-09 ENCOUNTER — Other Ambulatory Visit (HOSPITAL_COMMUNITY): Payer: Self-pay

## 2022-10-09 MED ORDER — FLECAINIDE ACETATE 50 MG PO TABS
75.0000 mg | ORAL_TABLET | Freq: Two times a day (BID) | ORAL | 3 refills | Status: DC
  Filled 2022-10-09: qty 90, 30d supply, fill #0
  Filled 2022-11-07: qty 90, 30d supply, fill #1
  Filled 2023-01-09: qty 90, 30d supply, fill #2
  Filled 2023-02-27: qty 90, 30d supply, fill #3

## 2022-11-07 ENCOUNTER — Other Ambulatory Visit (HOSPITAL_COMMUNITY): Payer: Self-pay | Admitting: Internal Medicine

## 2022-11-07 ENCOUNTER — Other Ambulatory Visit (HOSPITAL_COMMUNITY): Payer: Self-pay

## 2022-11-07 ENCOUNTER — Other Ambulatory Visit: Payer: Self-pay

## 2022-11-07 ENCOUNTER — Other Ambulatory Visit: Payer: Self-pay | Admitting: Family Medicine

## 2022-11-07 MED ORDER — ATORVASTATIN CALCIUM 20 MG PO TABS
20.0000 mg | ORAL_TABLET | Freq: Every day | ORAL | 1 refills | Status: DC
  Filled 2022-11-07: qty 90, 90d supply, fill #0
  Filled 2023-03-20: qty 90, 90d supply, fill #1

## 2022-11-07 MED ORDER — ENTRESTO 97-103 MG PO TABS
1.0000 | ORAL_TABLET | Freq: Two times a day (BID) | ORAL | 3 refills | Status: DC
  Filled 2022-11-07: qty 60, 30d supply, fill #0
  Filled 2022-12-14: qty 60, 30d supply, fill #1
  Filled 2023-01-09: qty 60, 30d supply, fill #2
  Filled 2023-02-27: qty 60, 30d supply, fill #3

## 2022-12-14 ENCOUNTER — Other Ambulatory Visit: Payer: Self-pay

## 2023-01-03 ENCOUNTER — Other Ambulatory Visit: Payer: Self-pay

## 2023-01-03 ENCOUNTER — Encounter: Payer: Self-pay | Admitting: Family Medicine

## 2023-01-03 ENCOUNTER — Ambulatory Visit (INDEPENDENT_AMBULATORY_CARE_PROVIDER_SITE_OTHER): Payer: BC Managed Care – PPO | Admitting: Family Medicine

## 2023-01-03 VITALS — BP 124/72 | HR 69 | Temp 97.8°F | Ht 69.5 in | Wt 236.1 lb

## 2023-01-03 DIAGNOSIS — Z125 Encounter for screening for malignant neoplasm of prostate: Secondary | ICD-10-CM | POA: Diagnosis not present

## 2023-01-03 DIAGNOSIS — Z114 Encounter for screening for human immunodeficiency virus [HIV]: Secondary | ICD-10-CM

## 2023-01-03 DIAGNOSIS — Z23 Encounter for immunization: Secondary | ICD-10-CM

## 2023-01-03 DIAGNOSIS — Z0000 Encounter for general adult medical examination without abnormal findings: Secondary | ICD-10-CM

## 2023-01-03 DIAGNOSIS — I10 Essential (primary) hypertension: Secondary | ICD-10-CM | POA: Diagnosis not present

## 2023-01-03 DIAGNOSIS — Z1211 Encounter for screening for malignant neoplasm of colon: Secondary | ICD-10-CM

## 2023-01-03 LAB — LIPID PANEL
Cholesterol: 126 mg/dL (ref 0–200)
HDL: 22.7 mg/dL — ABNORMAL LOW (ref >39.00)
LDL Cholesterol: 64 mg/dL (ref 0–99)
NonHDL: 103.12
Total CHOL/HDL Ratio: 6
Triglycerides: 195 mg/dL — ABNORMAL HIGH (ref 0.0–149.0)
VLDL: 39 mg/dL (ref 0.0–40.0)

## 2023-01-03 LAB — BASIC METABOLIC PANEL
BUN: 17 mg/dL (ref 6–23)
CO2: 25 meq/L (ref 19–32)
Calcium: 9.7 mg/dL (ref 8.4–10.5)
Chloride: 105 meq/L (ref 96–112)
Creatinine, Ser: 0.98 mg/dL (ref 0.40–1.50)
GFR: 89.89 mL/min (ref >60.00)
Glucose, Bld: 90 mg/dL (ref 70–99)
Potassium: 4.3 meq/L (ref 3.5–5.1)
Sodium: 137 meq/L (ref 135–145)

## 2023-01-03 LAB — CBC WITH DIFFERENTIAL/PLATELET
Basophils Absolute: 0 10*3/uL (ref 0.0–0.1)
Basophils Relative: 0.2 % (ref 0.0–3.0)
Eosinophils Absolute: 0.2 10*3/uL (ref 0.0–0.7)
Eosinophils Relative: 1.8 % (ref 0.0–5.0)
HCT: 47.2 % (ref 39.0–52.0)
Hemoglobin: 15.7 g/dL (ref 13.0–17.0)
Lymphocytes Relative: 29.4 % (ref 12.0–46.0)
Lymphs Abs: 2.5 10*3/uL (ref 0.7–4.0)
MCHC: 33.2 g/dL (ref 30.0–36.0)
MCV: 93.6 fL (ref 78.0–100.0)
Monocytes Absolute: 0.7 10*3/uL (ref 0.1–1.0)
Monocytes Relative: 7.7 % (ref 3.0–12.0)
Neutro Abs: 5.2 10*3/uL (ref 1.4–7.7)
Neutrophils Relative %: 60.9 % (ref 43.0–77.0)
Platelets: 252 10*3/uL (ref 150.0–400.0)
RBC: 5.04 Mil/uL (ref 4.22–5.81)
RDW: 12.7 % (ref 11.5–15.5)
WBC: 8.5 10*3/uL (ref 4.0–10.5)

## 2023-01-03 LAB — HEPATIC FUNCTION PANEL
ALT: 28 U/L (ref 0–53)
AST: 18 U/L (ref 0–37)
Albumin: 4.7 g/dL (ref 3.5–5.2)
Alkaline Phosphatase: 39 U/L (ref 39–117)
Bilirubin, Direct: 0.2 mg/dL (ref 0.0–0.3)
Total Bilirubin: 0.9 mg/dL (ref 0.2–1.2)
Total Protein: 7.2 g/dL (ref 6.0–8.3)

## 2023-01-03 LAB — TSH: TSH: 2.85 u[IU]/mL (ref 0.35–5.50)

## 2023-01-03 LAB — PSA: PSA: 0.71 ng/mL (ref 0.10–4.00)

## 2023-01-03 MED ORDER — METHOCARBAMOL 500 MG PO TABS
500.0000 mg | ORAL_TABLET | Freq: Three times a day (TID) | ORAL | 3 refills | Status: AC | PRN
  Filled 2023-01-03 – 2023-04-08 (×2): qty 30, 10d supply, fill #0

## 2023-01-03 NOTE — Assessment & Plan Note (Signed)
Chronic problem.  Well controlled.  Currently asymptomatic.  Check labs but no anticipated med changes.

## 2023-01-03 NOTE — Assessment & Plan Note (Signed)
Pt's PE WNL w/ exception of BMI.  UTD on Tdap.  Flu shot given.  Needs colon cancer screening- prefers Cologuard.  Check labs.  Anticipatory guidance provided.

## 2023-01-03 NOTE — Progress Notes (Signed)
Subjective:    Patient ID: CANELO Manning, male    DOB: 08-16-1970, 52 y.o.   MRN: [id number]  HPI CPE- due for colonoscopy.  UTD on Tdap.  Due for flu and shingles.  Patient Care Team    Relationship Specialty Notifications Start End  Troy Hatch, MD PCP - General Family Medicine  08/06/13   Bensimhon, Bevelyn Buckles, MD PCP - Advanced Heart Failure Cardiology  02/18/21     Health Maintenance  Topic Date Due   HIV Screening  Never done   Colonoscopy  Never done   Zoster Vaccines- Shingrix (1 of 2) Never done   INFLUENZA VACCINE  10/19/2022   COVID-19 Vaccine (2 - 2023-24 season) 11/19/2022   DTaP/Tdap/Td (3 - Td or Tdap) 10/31/2028   Hepatitis C Screening  Completed   HPV VACCINES  Aged Out      Review of Systems Patient reports no vision/hearing changes, anorexia, fever ,adenopathy, persistant/recurrent hoarseness, swallowing issues, chest pain, palpitations, edema, persistant/recurrent cough, hemoptysis, dyspnea (rest,exertional, paroxysmal nocturnal), gastrointestinal  bleeding (melena, rectal bleeding), abdominal pain, excessive heart burn, GU symptoms (dysuria, hematuria, voiding/incontinence issues) syncope, focal weakness, memory loss, numbness & tingling, skin/hair/nail changes, depression, anxiety, abnormal bruising/bleeding, musculoskeletal symptoms/signs.     Objective:   Physical Exam General Appearance:    Alert, cooperative, no distress, appears stated age  Head:    Normocephalic, without obvious abnormality, atraumatic  Eyes:    PERRL, conjunctiva/corneas clear, EOM's intact both eyes       Ears:    Normal TM's and external ear canals, both ears  Nose:   Nares normal, septum midline, mucosa normal, no drainage   or sinus tenderness  Throat:   Lips, mucosa, and tongue normal; teeth and gums normal  Neck:   Supple, symmetrical, trachea midline, no adenopathy;       thyroid:  No enlargement/tenderness/nodules  Back:     Symmetric, no curvature, ROM normal, no  CVA tenderness  Lungs:     Clear to auscultation bilaterally, respirations unlabored  Chest wall:    No tenderness or deformity  Heart:    Regular rate and rhythm, S1 and S2 normal, no murmur, rub   or gallop  Abdomen:     Soft, non-tender, bowel sounds active all four quadrants,    no masses, no organomegaly  Genitalia:    deferred  Rectal:    Extremities:   Extremities normal, atraumatic, no cyanosis or edema  Pulses:   2+ and symmetric all extremities  Skin:   Skin color, texture, turgor normal, no rashes or lesions  Lymph nodes:   Cervical, supraclavicular, and axillary nodes normal  Neurologic:   CNII-XII intact. Normal strength, sensation and reflexes      throughout          Assessment & Plan:

## 2023-01-03 NOTE — Patient Instructions (Signed)
Follow up in 6 months to recheck BP and cholesterol We'll notify you of your lab results and make any changes if needed Continue to work on healthy diet and regular exercise- you can do it!! Complete and return the cologuard as directed Call with any questions or concerns Stay Safe!  Stay Healthy! Happy Fall!!

## 2023-01-04 ENCOUNTER — Telehealth: Payer: Self-pay

## 2023-01-04 LAB — HIV ANTIBODY (ROUTINE TESTING W REFLEX): HIV 1&2 Ab, 4th Generation: NONREACTIVE

## 2023-01-04 NOTE — Telephone Encounter (Signed)
-----   Message from Neena Rhymes sent at 01/04/2023  7:30 AM EDT ----- Labs look good!  No changes at this time

## 2023-01-09 ENCOUNTER — Other Ambulatory Visit (HOSPITAL_COMMUNITY): Payer: Self-pay

## 2023-01-17 ENCOUNTER — Other Ambulatory Visit: Payer: Self-pay

## 2023-02-20 DIAGNOSIS — Z1211 Encounter for screening for malignant neoplasm of colon: Secondary | ICD-10-CM | POA: Diagnosis not present

## 2023-02-27 ENCOUNTER — Other Ambulatory Visit (HOSPITAL_COMMUNITY): Payer: Self-pay

## 2023-03-01 LAB — COLOGUARD: COLOGUARD: NEGATIVE

## 2023-04-08 ENCOUNTER — Other Ambulatory Visit: Payer: Self-pay | Admitting: Family Medicine

## 2023-04-08 ENCOUNTER — Other Ambulatory Visit (HOSPITAL_COMMUNITY): Payer: Self-pay | Admitting: Internal Medicine

## 2023-04-09 ENCOUNTER — Other Ambulatory Visit (HOSPITAL_COMMUNITY): Payer: Self-pay

## 2023-04-09 ENCOUNTER — Other Ambulatory Visit: Payer: Self-pay

## 2023-04-09 MED ORDER — ATORVASTATIN CALCIUM 20 MG PO TABS
20.0000 mg | ORAL_TABLET | Freq: Every day | ORAL | 1 refills | Status: DC
  Filled 2023-04-09 – 2023-06-26 (×2): qty 90, 90d supply, fill #0
  Filled 2023-11-19: qty 90, 90d supply, fill #1

## 2023-04-09 MED ORDER — FENOFIBRATE 160 MG PO TABS
160.0000 mg | ORAL_TABLET | Freq: Every day | ORAL | 1 refills | Status: DC
  Filled 2023-04-09: qty 90, 90d supply, fill #0
  Filled 2023-09-07: qty 90, 90d supply, fill #1

## 2023-04-09 MED ORDER — ENTRESTO 97-103 MG PO TABS
1.0000 | ORAL_TABLET | Freq: Two times a day (BID) | ORAL | 3 refills | Status: DC
  Filled 2023-04-09: qty 60, 30d supply, fill #0
  Filled 2023-05-22: qty 60, 30d supply, fill #1
  Filled 2023-06-26: qty 60, 30d supply, fill #2
  Filled 2023-08-02: qty 60, 30d supply, fill #3

## 2023-04-25 ENCOUNTER — Other Ambulatory Visit (HOSPITAL_COMMUNITY): Payer: Self-pay

## 2023-04-25 ENCOUNTER — Other Ambulatory Visit (HOSPITAL_COMMUNITY): Payer: Self-pay | Admitting: Internal Medicine

## 2023-04-25 MED ORDER — FLECAINIDE ACETATE 50 MG PO TABS
75.0000 mg | ORAL_TABLET | Freq: Two times a day (BID) | ORAL | 3 refills | Status: DC
  Filled 2023-04-25: qty 90, 30d supply, fill #0
  Filled 2023-06-11: qty 90, 30d supply, fill #1
  Filled 2023-08-02: qty 90, 30d supply, fill #2
  Filled 2023-10-04: qty 90, 30d supply, fill #3

## 2023-05-23 ENCOUNTER — Other Ambulatory Visit (HOSPITAL_COMMUNITY): Payer: Self-pay

## 2023-06-13 ENCOUNTER — Other Ambulatory Visit (HOSPITAL_COMMUNITY): Payer: Self-pay

## 2023-06-26 ENCOUNTER — Other Ambulatory Visit (HOSPITAL_COMMUNITY): Payer: Self-pay | Admitting: Internal Medicine

## 2023-06-26 ENCOUNTER — Other Ambulatory Visit (HOSPITAL_COMMUNITY): Payer: Self-pay

## 2023-06-29 ENCOUNTER — Other Ambulatory Visit (HOSPITAL_COMMUNITY): Payer: Self-pay

## 2023-06-29 MED ORDER — SPIRONOLACTONE 25 MG PO TABS
25.0000 mg | ORAL_TABLET | Freq: Every day | ORAL | 0 refills | Status: DC
  Filled 2023-06-29: qty 90, 90d supply, fill #0

## 2023-07-04 ENCOUNTER — Ambulatory Visit: Payer: BC Managed Care – PPO | Admitting: Family Medicine

## 2023-07-11 ENCOUNTER — Encounter: Payer: Self-pay | Admitting: Family Medicine

## 2023-07-11 ENCOUNTER — Ambulatory Visit (INDEPENDENT_AMBULATORY_CARE_PROVIDER_SITE_OTHER): Payer: BC Managed Care – PPO | Admitting: Family Medicine

## 2023-07-11 VITALS — BP 108/68 | HR 65 | Ht 69.5 in | Wt 239.0 lb

## 2023-07-11 DIAGNOSIS — I10 Essential (primary) hypertension: Secondary | ICD-10-CM

## 2023-07-11 DIAGNOSIS — E785 Hyperlipidemia, unspecified: Secondary | ICD-10-CM | POA: Diagnosis not present

## 2023-07-11 LAB — TSH: TSH: 2.33 u[IU]/mL (ref 0.35–5.50)

## 2023-07-11 LAB — HEPATIC FUNCTION PANEL
ALT: 30 U/L (ref 0–53)
AST: 22 U/L (ref 0–37)
Albumin: 4.5 g/dL (ref 3.5–5.2)
Alkaline Phosphatase: 33 U/L — ABNORMAL LOW (ref 39–117)
Bilirubin, Direct: 0.1 mg/dL (ref 0.0–0.3)
Total Bilirubin: 0.8 mg/dL (ref 0.2–1.2)
Total Protein: 7 g/dL (ref 6.0–8.3)

## 2023-07-11 LAB — BASIC METABOLIC PANEL WITH GFR
BUN: 16 mg/dL (ref 6–23)
CO2: 26 meq/L (ref 19–32)
Calcium: 9.3 mg/dL (ref 8.4–10.5)
Chloride: 106 meq/L (ref 96–112)
Creatinine, Ser: 0.99 mg/dL (ref 0.40–1.50)
GFR: 88.48 mL/min (ref >60.00)
Glucose, Bld: 93 mg/dL (ref 70–99)
Potassium: 4.2 meq/L (ref 3.5–5.1)
Sodium: 139 meq/L (ref 135–145)

## 2023-07-11 LAB — CBC WITH DIFFERENTIAL/PLATELET
Basophils Absolute: 0 10*3/uL (ref 0.0–0.1)
Basophils Relative: 0.4 % (ref 0.0–3.0)
Eosinophils Absolute: 0.2 10*3/uL (ref 0.0–0.7)
Eosinophils Relative: 2.3 % (ref 0.0–5.0)
HCT: 42.9 % (ref 39.0–52.0)
Hemoglobin: 14.7 g/dL (ref 13.0–17.0)
Lymphocytes Relative: 35.5 % (ref 12.0–46.0)
Lymphs Abs: 2.5 10*3/uL (ref 0.7–4.0)
MCHC: 34.2 g/dL (ref 30.0–36.0)
MCV: 93 fl (ref 78.0–100.0)
Monocytes Absolute: 0.6 10*3/uL (ref 0.1–1.0)
Monocytes Relative: 7.9 % (ref 3.0–12.0)
Neutro Abs: 3.8 10*3/uL (ref 1.4–7.7)
Neutrophils Relative %: 53.9 % (ref 43.0–77.0)
Platelets: 241 10*3/uL (ref 150.0–400.0)
RBC: 4.61 Mil/uL (ref 4.22–5.81)
RDW: 12.4 % (ref 11.5–15.5)
WBC: 7 10*3/uL (ref 4.0–10.5)

## 2023-07-11 LAB — LIPID PANEL
Cholesterol: 118 mg/dL (ref 0–200)
HDL: 20.2 mg/dL — ABNORMAL LOW (ref >39.00)
LDL Cholesterol: 62 mg/dL (ref 0–99)
NonHDL: 97.59
Total CHOL/HDL Ratio: 6
Triglycerides: 177 mg/dL — ABNORMAL HIGH (ref 0.0–149.0)
VLDL: 35.4 mg/dL (ref 0.0–40.0)

## 2023-07-11 NOTE — Assessment & Plan Note (Signed)
 Chronic problem.  Well controlled on Aldactone  25mg , Entresto , and Coreg .  Currently asymptomatic.  Check labs due to diuretic and ARB use but no anticipated med changes.  Will follow.

## 2023-07-11 NOTE — Assessment & Plan Note (Signed)
 Ongoing issue.  He is up 3 lbs since October.  BMI 34.79.  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  Check labs to risk stratify.  Will continue to follow.

## 2023-07-11 NOTE — Progress Notes (Signed)
   Subjective:    Patient ID: [name], male    DOB: [birth year], 53 y.o.   MRN: [id number]  HPI HTN- chronic problem.  Currently on Aldactone  25mg  daily, Entresto  97/103mg  BID, Coreg  25mg  BID.  No CP, SOB, HA's, visual changes, edema.  Hyperlipidemia- chronic problem, on Lipitor 20mg  daily, fenofibrate  160mg  daily.  No abd pain, N/V.  Obesity- ongoing issue.  Has gained 3 lbs.  BMI 34.79  Continues to be active, no formal exercise.  5-10,000 steps daily.   Review of Systems For ROS see HPI     Objective:   Physical Exam Vitals reviewed.  Constitutional:      General: He is not in acute distress.    Appearance: Normal appearance. He is well-developed. He is not ill-appearing.  HENT:     Head: Normocephalic and atraumatic.  Eyes:     Extraocular Movements: Extraocular movements intact.     Conjunctiva/sclera: Conjunctivae normal.     Pupils: Pupils are equal, round, and reactive to light.  Neck:     Thyroid : No thyromegaly.  Cardiovascular:     Rate and Rhythm: Normal rate and regular rhythm.     Pulses: Normal pulses.     Heart sounds: Normal heart sounds. No murmur heard. Pulmonary:     Effort: Pulmonary effort is normal. No respiratory distress.     Breath sounds: Normal breath sounds.  Abdominal:     General: Bowel sounds are normal. There is no distension.     Palpations: Abdomen is soft.  Musculoskeletal:     Cervical back: Normal range of motion and neck supple.     Right lower leg: No edema.     Left lower leg: No edema.  Lymphadenopathy:     Cervical: No cervical adenopathy.  Skin:    General: Skin is warm and dry.  Neurological:     General: No focal deficit present.     Mental Status: He is alert and oriented to person, place, and time.     Cranial Nerves: No cranial nerve deficit.  Psychiatric:        Mood and Affect: Mood normal.        Behavior: Behavior normal.           Assessment & Plan:

## 2023-07-11 NOTE — Assessment & Plan Note (Signed)
Chronic problem.  On Lipitor 20mg daily and Fenofibrate 160mg daily w/o difficulty.  Check labs.  Adjust meds prn  ?

## 2023-07-11 NOTE — Patient Instructions (Signed)
Schedule your complete physical in 6 months We'll notify you of your lab results and make any changes if needed Continue to work on healthy diet and regular exercise- you're doing great! Call with any questions or concerns Stay Safe!  Stay Healthy! Happy [place] Birthday!

## 2023-07-12 ENCOUNTER — Telehealth: Payer: Self-pay

## 2023-07-12 NOTE — Telephone Encounter (Signed)
 Pt has reviewed labs via MyChart

## 2023-07-12 NOTE — Telephone Encounter (Signed)
-----   Message from Laymon Priest sent at 07/11/2023  4:09 PM EDT ----- Labs look great!  No changes at this time

## 2023-08-02 ENCOUNTER — Other Ambulatory Visit: Payer: Self-pay

## 2023-09-07 ENCOUNTER — Other Ambulatory Visit (HOSPITAL_COMMUNITY): Payer: Self-pay | Admitting: Internal Medicine

## 2023-09-11 ENCOUNTER — Other Ambulatory Visit (HOSPITAL_COMMUNITY): Payer: Self-pay

## 2023-09-12 ENCOUNTER — Other Ambulatory Visit (HOSPITAL_COMMUNITY): Payer: Self-pay

## 2023-09-12 MED ORDER — ENTRESTO 97-103 MG PO TABS
1.0000 | ORAL_TABLET | Freq: Two times a day (BID) | ORAL | 3 refills | Status: DC
  Filled 2023-09-12: qty 60, 30d supply, fill #0
  Filled 2023-10-22: qty 60, 30d supply, fill #1
  Filled 2023-11-20: qty 60, 30d supply, fill #2
  Filled 2024-01-17: qty 60, 30d supply, fill #3

## 2023-09-12 MED ORDER — CARVEDILOL 25 MG PO TABS
25.0000 mg | ORAL_TABLET | Freq: Two times a day (BID) | ORAL | 3 refills | Status: AC
  Filled 2023-09-12: qty 180, 90d supply, fill #0
  Filled 2023-12-20: qty 180, 90d supply, fill #1
  Filled 2024-04-01: qty 180, 90d supply, fill #0

## 2023-09-18 ENCOUNTER — Encounter (HOSPITAL_COMMUNITY)

## 2023-10-04 ENCOUNTER — Other Ambulatory Visit (HOSPITAL_COMMUNITY): Payer: Self-pay | Admitting: Internal Medicine

## 2023-10-05 ENCOUNTER — Other Ambulatory Visit (HOSPITAL_COMMUNITY): Payer: Self-pay

## 2023-10-05 MED ORDER — SPIRONOLACTONE 25 MG PO TABS
25.0000 mg | ORAL_TABLET | Freq: Every day | ORAL | 0 refills | Status: DC
  Filled 2023-10-05: qty 90, 90d supply, fill #0

## 2023-10-15 ENCOUNTER — Telehealth: Payer: Self-pay | Admitting: Internal Medicine

## 2023-10-15 NOTE — Telephone Encounter (Signed)
 Called to confirm/remind patient of their appointment at the [facility] on 10/16/23.   Appointment:   [x] Confirmed  [] Left mess   [] No answer/No voice mail  [] VM Full/unable to leave message  [] Phone not in service  Patient reminded to bring all medications and/or complete list.  Confirmed patient has transportation. Gave directions, instructed to utilize valet parking.

## 2023-10-16 ENCOUNTER — Encounter: Payer: Self-pay | Admitting: Internal Medicine

## 2023-10-16 ENCOUNTER — Ambulatory Visit: Attending: Internal Medicine | Admitting: Internal Medicine

## 2023-10-16 ENCOUNTER — Other Ambulatory Visit (HOSPITAL_COMMUNITY): Payer: Self-pay

## 2023-10-16 ENCOUNTER — Other Ambulatory Visit: Payer: Self-pay

## 2023-10-16 VITALS — BP 118/71 | HR 71 | Wt 238.6 lb

## 2023-10-16 DIAGNOSIS — I5022 Chronic systolic (congestive) heart failure: Secondary | ICD-10-CM

## 2023-10-16 DIAGNOSIS — I4729 Other ventricular tachycardia: Secondary | ICD-10-CM | POA: Diagnosis not present

## 2023-10-16 DIAGNOSIS — Z008 Encounter for other general examination: Secondary | ICD-10-CM | POA: Insufficient documentation

## 2023-10-16 DIAGNOSIS — I493 Ventricular premature depolarization: Secondary | ICD-10-CM | POA: Diagnosis not present

## 2023-10-16 MED ORDER — EMPAGLIFLOZIN 10 MG PO TABS
10.0000 mg | ORAL_TABLET | Freq: Every day | ORAL | 11 refills | Status: DC
  Filled 2023-10-16: qty 30, 30d supply, fill #0
  Filled 2023-11-19: qty 30, 30d supply, fill #1
  Filled 2023-11-22: qty 30, 30d supply, fill #0
  Filled 2023-12-20: qty 30, 30d supply, fill #1
  Filled 2024-02-07: qty 30, 30d supply, fill #0

## 2023-10-16 NOTE — Progress Notes (Signed)
 ADVANCED [facility] CLINIC NOTE   Patient ID: [name], male   DOB: January 02, 1971, 53 y.o.   MRN: [id number] PCP: Dr Mahlon Pulmonary: Dr Jude  HPI: [name] is a 53 y.o. male with a history of ETOH consumption, obesity, and systolic heart failure due to NICM (onset 3/15)  Admitted 3/15 with acute systolic heart failure. Echo EF 10% with global HK, large LV thrombus and severe RV dysfunction.  LHC 05/2013 normal cors. Required short course of Milrinone .  cMRI 8/15 EF 31% no scar  Echo 12/22 EF 45-50%  Zio 12/22:  - Isolated PVCs were frequent (16.8%, 881323), - mostly one morphology  - PVCs couplets were occasional (4.3%, 15331)  Unable to tolerate mexilitene. In 3/23 started on flecainide   Echo 7/23 EF 35-40%  cMRI 5/24 EF 40% RV 49% nonspecific 49%  Here for f/u. Feels great. Working [employer]. No SOB, CP, orthopnea, PND or edema.   Studies:  ECHO 06/01/13 EF 10%  ECHO 09/03/13  EF 25-30% RV normal No LV thrombus ECHO 5/16 EF 40-45%,  Echo 2/20 EF 45-50%  CPX (2/16) with peak VO2 27.4, VE/VCO2 slope 27.4, RER 1.28 => low normal functional capacity.                                                                                                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 Prior heavy ETOH, now rare.   FH: Mom: HTN, DM       Grandmother: HTN, DM, heart disease   Review of systems complete and found to be negative unless listed in HPI.    Past Medical History:  Diagnosis Date   Athlete's foot    CHF (congestive heart failure) (HCC)    a) NICM b) ECHO (05/2013) EF 10%, diff HK, large mural thrombus, mild MR, LA sev. dilated, RA mildly dilated, mod TR c) Swan GC (06/02/13): CVP 19, PA 41/25 (33), PCWP 29, Thermo CO/CI 2.4 / 1.1 d) cMRI EF 31%, no definite myocardial delayed enhancement    Hypertension    NICM (nonischemic cardiomyopathy) (HCC)    a) LHC (06/04/2013): normal coronary arteries    Current Outpatient Medications   Medication Sig Dispense Refill   aspirin  EC 81 MG tablet Take 81 mg by mouth daily.     atorvastatin  (LIPITOR) 20 MG tablet Take 1 tablet (20 mg total) by mouth daily. 90 tablet 1   carvedilol  (COREG ) 25 MG tablet Take 1 tablet (25 mg total) by mouth 2 (two) times daily with a meal. 180 tablet 3   fenofibrate  160 MG tablet Take 1 tablet (160 mg total) by mouth daily. NEEDS FOLLOW UP APPOINTMENT FOR MOR REFILLS 90 tablet 1   flecainide  (TAMBOCOR ) 50 MG tablet Take 1 & 1/2 tablets (75 mg total) by mouth 2 (two) times daily. 90 tablet 3   sacubitril -valsartan  (ENTRESTO ) 97-103 MG Take 1 tablet by mouth 2 (two) times daily. NEEDS FOLLOW UP APPOINTMENT FOR MORE REFILLS 60 tablet 3   spironolactone  (ALDACTONE ) 25 MG tablet Take 1 tablet (25 mg  total) by mouth daily. 90 tablet 0   albuterol  (VENTOLIN  HFA) 108 (90 Base) MCG/ACT inhaler Inhale 2 puffs into the lungs every 6 (six) hours as needed for wheezing or shortness of breath. (Patient not taking: Reported on 10/16/2023) 6.7 g 0   methocarbamol  (ROBAXIN ) 500 MG tablet Take 1 tablet (500 mg total) by mouth every 8 (eight) hours as needed for muscle spasms. (Patient not taking: Reported on 10/16/2023) 30 tablet 3   No current facility-administered medications for this visit.   Vitals:   10/16/23 1526  BP: 118/71  Pulse: 71  SpO2: 97%  Weight: 238 lb 9.6 oz (108.2 kg)      Wt Readings from Last 3 Encounters:  10/16/23 238 lb 9.6 oz (108.2 kg)  07/11/23 239 lb (108.4 kg)  01/03/23 236 lb 2 oz (107.1 kg)    PHYSICAL EXAM: General:  Well appearing. No resp difficulty HEENT: normal Neck: supple. no JVD. Carotids 2+ bilat; no bruits. No lymphadenopathy or thryomegaly appreciated. Cor: PMI nondisplaced. Regular rate & rhythm. No rubs, gallops or murmurs. Lungs: clear Abdomen: soft, nontender, nondistended. No hepatosplenomegaly. No bruits or masses. Good bowel sounds. Extremities: no cyanosis, clubbing, rash, edema Neuro: alert & orientedx3,  cranial nerves grossly intact. moves all 4 extremities w/o difficulty. Affect pleasant   ASSESSMENT & PLAN:  1. Chronic Systolic Heart Failure: Nonischemic cardiomyopathy (ETOH may have played a role), echo with EF 10% in 3/15. ECHO 07/2014 40-45%.  - cMRI 10/2013 with LVEF 31%, Normal RV, No LGE.  - Echo 7/18 EF 35-40%  - Echo 12/22 EF 45-50% - Echo 7/23 EF 35-40% (started flecainide  3/23 for PVC suppression) - cMRI 5/24 EF 40% RV 49% nonspecific 49% - Doing great NYHA I - Volume looks good - Not on diuretics - Continue spiro 25 mg daily - Continue coreg  25 mg BID - Continue Entresto  to 97/103 mg BID.  - Add Jardiance  10 - Started flecainide  75 bid in 3/23. PVCs appears suppressed - Recent labs ok  - 2. NSVT:  - Resolved. EF improved to out of ICD range.  - no change  3. LV thrombus:  - resolved  4. Mild OSA:  - Per Dr Jude mild sleep apnea in 5/15 No intervention needed.                                                                                          5. Hyperlipidemia:  - Continue statin.   - PCP following lipids.   6. Frequent PVCs. - high burden on Zio in 2022 - Started Flecainide  3/23. - Repeat Zio to quantify  Toribio Fuel, MD  10/16/2023

## 2023-10-16 NOTE — Patient Instructions (Signed)
 Medication Changes:  START Jardiance  10mg  (1 tab) daily   Your provider has recommended that  you wear a Zio Patch for 7 days.  This monitor will record your heart rhythm for our review.  IF you have any symptoms while wearing the monitor please press the button.  If you have any issues with the patch or you notice a red or orange light on it please call the company at [phone].  Once you remove the patch please mail it back to the company as soon as possible so we can get the results.    DON'T FORGET: come to the clinic on 11/05/23 in order to have your zio patch placed for 7 days.   Follow-Up in: Please follow up with the [facility] in 6-9 months with Dr. Cherrie. We do not have that schedule. Please give us  a call in January to schedule your appointment for [place].    Thank you for choosing [facility].    At the [facility], you and your health needs are our priority. We have a designated team specialized in the treatment of Heart Failure. This Care Team includes your primary Heart Failure Specialized Cardiologist (physician), Advanced Practice Providers (APPs- Physician Assistants and Nurse Practitioners), and Pharmacist who all work together to provide you with the care you need, when you need it.   You may see any of the following providers on your designated Care Team at your next follow up:  Dr. Toribio Cherrie Dr. Ezra Shuck Dr. Ria Commander Dr. Morene Brownie Ellouise Class, FNP Jaun Bash, RPH-CPP  Please be sure to bring in all your medications bottles to every appointment.   Need to Contact Us :  If you have any questions or concerns before your next appointment please send us  a message through [place] or call our office at [phone].    TO LEAVE A MESSAGE FOR THE NURSE SELECT OPTION 2, PLEASE LEAVE A MESSAGE INCLUDING: YOUR NAME DATE OF BIRTH CALL BACK NUMBER REASON FOR  CALL**this is important as we prioritize the call backs  YOU WILL RECEIVE A CALL BACK THE SAME DAY AS LONG AS YOU CALL BEFORE 4:00 PM

## 2023-10-17 ENCOUNTER — Telehealth (HOSPITAL_COMMUNITY): Payer: Self-pay

## 2023-10-17 NOTE — Telephone Encounter (Signed)
 Advanced Heart Failure Patient Advocate Encounter  Test billing for this patients current coverage (Rx BCBS) shows $0 for 90 day supply of Jardiance .  Rachel DEL, CPhT Rx Patient Advocate Phone: [phone]

## 2023-10-18 ENCOUNTER — Other Ambulatory Visit: Payer: Self-pay

## 2023-10-21 NOTE — Progress Notes (Signed)
 ADVANCED HF CLINIC NOTE   Patient ID: [name], male   DOB: 1970-09-15, 53 y.o.   MRN: [id number] PCP: Dr Mahlon Pulmonary: Dr Jude  HPI: KENDEL Manning is a 53 y.o. male with a history of ETOH consumption, obesity, and systolic heart failure due to NICM (onset 3/15)  Admitted 3/15 with acute systolic heart failure. Echo EF 10% with global HK, large LV thrombus and severe RV dysfunction.  LHC 05/2013 normal cors. Required short course of Milrinone .  cMRI 8/15 EF 31% no scar  Echo 12/22 EF 45-50%  Zio 12/22:  - Isolated PVCs were frequent (16.8%, 881323), - mostly one morphology  - PVCs couplets were occasional (4.3%, 15331)  Unable to tolerate mexilitene. In 3/23 started on flecainide   Echo 7/23 EF 35-40%  cMRI 5/24 EF 40% RV 49% nonspecific 49%  Here for f/u. Feels great. Working [employer]. No SOB, CP, orthopnea, PND or edema.   Studies:  ECHO 06/01/13 EF 10%  ECHO 09/03/13  EF 25-30% RV normal No LV thrombus ECHO 5/16 EF 40-45%,  Echo 2/20 EF 45-50%  CPX (2/16) with peak VO2 27.4, VE/VCO2 slope 27.4, RER 1.28 => low normal functional capacity.                                                                                                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 Prior heavy ETOH, now rare.   FH: Mom: HTN, DM       Grandmother: HTN, DM, heart disease   Review of systems complete and found to be negative unless listed in HPI.    Past Medical History:  Diagnosis Date   Athlete's foot    CHF (congestive heart failure) (HCC)    a) NICM b) ECHO (05/2013) EF 10%, diff HK, large mural thrombus, mild MR, LA sev. dilated, RA mildly dilated, mod TR c) Swan GC (06/02/13): CVP 19, PA 41/25 (33), PCWP 29, Thermo CO/CI 2.4 / 1.1 d) cMRI EF 31%, no definite myocardial delayed enhancement    Hypertension    NICM (nonischemic cardiomyopathy) (HCC)    a) LHC (06/04/2013): normal coronary arteries    Current Outpatient  Medications  Medication Sig Dispense Refill   aspirin  EC 81 MG tablet Take 81 mg by mouth daily.     atorvastatin  (LIPITOR) 20 MG tablet Take 1 tablet (20 mg total) by mouth daily. 90 tablet 1   carvedilol  (COREG ) 25 MG tablet Take 1 tablet (25 mg total) by mouth 2 (two) times daily with a meal. 180 tablet 3   empagliflozin  (JARDIANCE ) 10 MG TABS tablet Take 1 tablet (10 mg total) by mouth daily before breakfast. 30 tablet 11   fenofibrate  160 MG tablet Take 1 tablet (160 mg total) by mouth daily. NEEDS FOLLOW UP APPOINTMENT FOR MOR REFILLS 90 tablet 1   flecainide  (TAMBOCOR ) 50 MG tablet Take 1 & 1/2 tablets (75 mg total) by mouth 2 (two) times daily. 90 tablet 3   sacubitril -valsartan  (ENTRESTO ) 97-103 MG Take 1 tablet by mouth 2 (two) times daily.  NEEDS FOLLOW UP APPOINTMENT FOR MORE REFILLS 60 tablet 3   spironolactone  (ALDACTONE ) 25 MG tablet Take 1 tablet (25 mg total) by mouth daily. 90 tablet 0   albuterol  (VENTOLIN  HFA) 108 (90 Base) MCG/ACT inhaler Inhale 2 puffs into the lungs every 6 (six) hours as needed for wheezing or shortness of breath. (Patient not taking: Reported on 10/16/2023) 6.7 g 0   methocarbamol  (ROBAXIN ) 500 MG tablet Take 1 tablet (500 mg total) by mouth every 8 (eight) hours as needed for muscle spasms. (Patient not taking: Reported on 10/16/2023) 30 tablet 3   No current facility-administered medications for this visit.   Vitals:   10/16/23 1526  BP: 118/71  Pulse: 71  SpO2: 97%  Weight: 238 lb 9.6 oz (108.2 kg)      Wt Readings from Last 3 Encounters:  10/16/23 238 lb 9.6 oz (108.2 kg)  07/11/23 239 lb (108.4 kg)  01/03/23 236 lb 2 oz (107.1 kg)    PHYSICAL EXAM: General:  Well appearing. No resp difficulty HEENT: normal Neck: supple. no JVD. Carotids 2+ bilat; no bruits. No lymphadenopathy or thryomegaly appreciated. Cor: PMI nondisplaced. Regular rate & rhythm. No rubs, gallops or murmurs. Lungs: clear Abdomen: soft, nontender, nondistended. No  hepatosplenomegaly. No bruits or masses. Good bowel sounds. Extremities: no cyanosis, clubbing, rash, edema Neuro: alert & orientedx3, cranial nerves grossly intact. moves all 4 extremities w/o difficulty. Affect pleasant   ASSESSMENT & PLAN:  1. Chronic Systolic Heart Failure: Nonischemic cardiomyopathy (ETOH may have played a role), echo with EF 10% in 3/15. ECHO 07/2014 40-45%.  - cMRI 10/2013 with LVEF 31%, Normal RV, No LGE.  - Echo 7/18 EF 35-40%  - Echo 12/22 EF 45-50% - Echo 7/23 EF 35-40% (started flecainide  3/23 for PVC suppression) - cMRI 5/24 EF 40% RV 49% nonspecific 49% - Doing great NYHA I - Volume looks good - Not on diuretics - Continue spiro 25 mg daily - Continue coreg  25 mg BID - Continue Entresto  to 97/103 mg BID.  - Add Jardiance  10 - Started flecainide  75 bid in 3/23. PVCs appears suppressed - Recent labs ok  - 2. NSVT:  - Resolved. EF improved to out of ICD range.  - no change  3. LV thrombus:  - resolved  4. Mild OSA:  - Per Dr Jude mild sleep apnea in 5/15 No intervention needed.                                                                                          5. Hyperlipidemia:  - Continue statin.   - PCP following lipids.   6. Frequent PVCs. - high burden on Zio in 2022 - Started Flecainide  3/23. - Repeat Zio to quantify  Toribio Fuel, MD  10/21/2023

## 2023-10-22 ENCOUNTER — Other Ambulatory Visit (HOSPITAL_COMMUNITY): Payer: Self-pay

## 2023-11-06 ENCOUNTER — Other Ambulatory Visit (HOSPITAL_COMMUNITY): Payer: Self-pay | Admitting: Internal Medicine

## 2023-11-06 ENCOUNTER — Inpatient Hospital Stay (HOSPITAL_COMMUNITY)
Admission: RE | Admit: 2023-11-06 | Discharge: 2023-11-06 | Disposition: A | Discharge status: Home / Self Care | Source: Ambulatory Visit | Attending: Internal Medicine | Admitting: Internal Medicine

## 2023-11-06 ENCOUNTER — Telehealth: Payer: Self-pay

## 2023-11-06 DIAGNOSIS — I493 Ventricular premature depolarization: Secondary | ICD-10-CM

## 2023-11-06 NOTE — Telephone Encounter (Signed)
 Pt arrived to have zio placed for 7 days for pvc's per dr. Bensimhon. Zio placed and directions reviewed. No further questions at this time.

## 2023-11-19 ENCOUNTER — Other Ambulatory Visit (HOSPITAL_COMMUNITY): Payer: Self-pay | Admitting: Internal Medicine

## 2023-11-21 ENCOUNTER — Other Ambulatory Visit: Payer: Self-pay

## 2023-11-21 ENCOUNTER — Other Ambulatory Visit (HOSPITAL_COMMUNITY): Payer: Self-pay

## 2023-11-21 MED ORDER — FLECAINIDE ACETATE 50 MG PO TABS
75.0000 mg | ORAL_TABLET | Freq: Two times a day (BID) | ORAL | 1 refills | Status: DC
  Filled 2023-11-21: qty 90, 30d supply, fill #0
  Filled 2023-12-20: qty 90, 30d supply, fill #1

## 2023-11-22 ENCOUNTER — Other Ambulatory Visit (HOSPITAL_COMMUNITY): Payer: Self-pay

## 2023-11-22 ENCOUNTER — Other Ambulatory Visit: Payer: Self-pay

## 2023-11-22 DIAGNOSIS — I493 Ventricular premature depolarization: Secondary | ICD-10-CM | POA: Diagnosis not present

## 2023-11-23 ENCOUNTER — Other Ambulatory Visit (HOSPITAL_COMMUNITY): Payer: Self-pay

## 2023-12-20 ENCOUNTER — Other Ambulatory Visit (HOSPITAL_COMMUNITY): Payer: Self-pay | Admitting: Internal Medicine

## 2023-12-21 ENCOUNTER — Other Ambulatory Visit (HOSPITAL_COMMUNITY): Payer: Self-pay

## 2023-12-21 ENCOUNTER — Other Ambulatory Visit: Payer: Self-pay

## 2023-12-21 MED ORDER — FENOFIBRATE 160 MG PO TABS
160.0000 mg | ORAL_TABLET | Freq: Every day | ORAL | 3 refills | Status: AC
  Filled 2023-12-21 – 2024-04-01 (×2): qty 90, 90d supply, fill #0

## 2024-01-10 ENCOUNTER — Encounter: Admitting: Family Medicine

## 2024-01-16 DIAGNOSIS — L821 Other seborrheic keratosis: Secondary | ICD-10-CM | POA: Diagnosis not present

## 2024-01-16 DIAGNOSIS — L570 Actinic keratosis: Secondary | ICD-10-CM | POA: Diagnosis not present

## 2024-01-16 DIAGNOSIS — L812 Freckles: Secondary | ICD-10-CM | POA: Diagnosis not present

## 2024-01-16 DIAGNOSIS — D2262 Melanocytic nevi of left upper limb, including shoulder: Secondary | ICD-10-CM | POA: Diagnosis not present

## 2024-01-17 ENCOUNTER — Other Ambulatory Visit: Payer: Self-pay

## 2024-01-18 ENCOUNTER — Other Ambulatory Visit (HOSPITAL_COMMUNITY): Payer: Self-pay

## 2024-01-22 ENCOUNTER — Other Ambulatory Visit (HOSPITAL_COMMUNITY): Payer: Self-pay | Admitting: Internal Medicine

## 2024-01-22 ENCOUNTER — Other Ambulatory Visit: Payer: Self-pay

## 2024-01-22 MED ORDER — SPIRONOLACTONE 25 MG PO TABS
25.0000 mg | ORAL_TABLET | Freq: Every day | ORAL | 3 refills | Status: AC
  Filled 2024-01-22: qty 90, 90d supply, fill #0
  Filled 2024-04-17: qty 90, 90d supply, fill #1

## 2024-01-22 MED ORDER — FLECAINIDE ACETATE 50 MG PO TABS
75.0000 mg | ORAL_TABLET | Freq: Two times a day (BID) | ORAL | 3 refills | Status: DC
  Filled 2024-01-22: qty 90, 30d supply, fill #0
  Filled 2024-03-16: qty 90, 30d supply, fill #1

## 2024-02-07 ENCOUNTER — Other Ambulatory Visit (HOSPITAL_COMMUNITY): Payer: Self-pay

## 2024-02-09 ENCOUNTER — Ambulatory Visit (HOSPITAL_COMMUNITY): Payer: Self-pay | Admitting: Internal Medicine

## 2024-02-12 ENCOUNTER — Telehealth (HOSPITAL_COMMUNITY): Payer: Self-pay | Admitting: *Deleted

## 2024-02-12 NOTE — Telephone Encounter (Signed)
 Called patient per Dr. Rosalynd with Zio results and recommendations:  Needs f/u appt to discuss PVCs   Scheduled clinic appointment in [facility] for 02/21/24 at 11:00. Pt verbalized understanding and agreement to same.

## 2024-02-14 ENCOUNTER — Other Ambulatory Visit: Payer: Self-pay | Admitting: Family Medicine

## 2024-02-17 ENCOUNTER — Other Ambulatory Visit (HOSPITAL_COMMUNITY): Payer: Self-pay | Admitting: Internal Medicine

## 2024-02-18 ENCOUNTER — Other Ambulatory Visit: Payer: Self-pay

## 2024-02-18 ENCOUNTER — Other Ambulatory Visit (HOSPITAL_COMMUNITY): Payer: Self-pay

## 2024-02-18 MED ORDER — SACUBITRIL-VALSARTAN 97-103 MG PO TABS
1.0000 | ORAL_TABLET | Freq: Two times a day (BID) | ORAL | 3 refills | Status: AC
  Filled 2024-02-18: qty 60, 30d supply, fill #0
  Filled 2024-03-16: qty 60, 30d supply, fill #1
  Filled 2024-04-14: qty 60, 30d supply, fill #2

## 2024-02-19 ENCOUNTER — Other Ambulatory Visit (HOSPITAL_COMMUNITY): Payer: Self-pay

## 2024-02-19 ENCOUNTER — Other Ambulatory Visit: Payer: Self-pay

## 2024-02-19 MED ORDER — ATORVASTATIN CALCIUM 20 MG PO TABS
20.0000 mg | ORAL_TABLET | Freq: Every day | ORAL | 1 refills | Status: AC
  Filled 2024-02-19: qty 90, 90d supply, fill #0

## 2024-02-20 ENCOUNTER — Telehealth (HOSPITAL_COMMUNITY): Payer: Self-pay

## 2024-02-20 NOTE — Progress Notes (Signed)
 ADVANCED HF CLINIC NOTE   Patient ID: [name], male   DOB: [birth year], 53 y.o.   MRN: [id number] PCP: Dr Mahlon Pulmonary: Dr Jude  HPI: [name] is a 53 y.o. male with a history of ETOH consumption, obesity, and systolic heart failure due to NICM (onset 3/15)  Admitted 3/15 with acute systolic heart failure. Echo EF 10% with global HK, large LV thrombus and severe RV dysfunction.  LHC 05/2013 normal cors. Required short course of Milrinone .  cMRI 8/15 EF 31% no scar  Echo 12/22 EF 45-50%  Zio 12/22:  - Isolated PVCs were frequent (16.8%, 881323), - mostly one morphology  - PVCs couplets were occasional (4.3%, 15331)  Unable to tolerate mexilitene. In 3/23 started on flecainide   Echo 7/23 EF 35-40%  cMRI 5/24 EF 40% RV 49% nonspecific 49%  Zio 9/25 with 11.1% PVC burden   Today he returns for AHF follow up. Overall feeling ***. Denies palpitations, CP, dizziness, edema, or PND/Orthopnea. *** SOB. Appetite ok. Weight at home *** pounds. Taking all medications. Denies ETOH, tobacco or drug use.   Studies:  ECHO 06/01/13 EF 10%  ECHO 09/03/13  EF 25-30% RV normal No LV thrombus ECHO 5/16 EF 40-45%,  Echo 2/20 EF 45-50%  CPX (2/16) with peak VO2 27.4, VE/VCO2 slope 27.4, RER 1.28 => low normal functional capacity.                                                                                                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 Prior heavy ETOH, now rare.   FH: Mom: HTN, DM       Grandmother: HTN, DM, heart disease   Review of systems complete and found to be negative unless listed in HPI.    Past Medical History:  Diagnosis Date   Athlete's foot    CHF (congestive heart failure) (HCC)    a) NICM b) ECHO (05/2013) EF 10%, diff HK, large mural thrombus, mild MR, LA sev. dilated, RA mildly dilated, mod TR c) Swan GC (06/02/13): CVP 19, PA 41/25 (33), PCWP 29, Thermo CO/CI 2.4 / 1.1 d) cMRI EF  31%, no definite myocardial delayed enhancement    Hypertension    NICM (nonischemic cardiomyopathy) (HCC)    a) LHC (06/04/2013): normal coronary arteries    Current Outpatient Medications  Medication Sig Dispense Refill   albuterol  (VENTOLIN  HFA) 108 (90 Base) MCG/ACT inhaler Inhale 2 puffs into the lungs every 6 (six) hours as needed for wheezing or shortness of breath. (Patient not taking: Reported on 10/16/2023) 6.7 g 0   aspirin  EC 81 MG tablet Take 81 mg by mouth daily.     atorvastatin  (LIPITOR) 20 MG tablet Take 1 tablet (20 mg total) by mouth daily. 90 tablet 1   carvedilol  (COREG ) 25 MG tablet Take 1 tablet (25 mg total) by mouth 2 (two) times daily with a meal. 180 tablet 3   empagliflozin  (JARDIANCE ) 10 MG TABS tablet Take 1 tablet (10 mg total) by mouth daily before breakfast. 30  tablet 11   fenofibrate  160 MG tablet Take 1 tablet (160 mg total) by mouth daily. 90 tablet 3   flecainide  (TAMBOCOR ) 50 MG tablet Take 1.5 tablets (75 mg total) by mouth 2 (two) times daily. 90 tablet 3   methocarbamol  (ROBAXIN ) 500 MG tablet Take 1 tablet (500 mg total) by mouth every 8 (eight) hours as needed for muscle spasms. (Patient not taking: Reported on 10/16/2023) 30 tablet 3   sacubitril -valsartan  (ENTRESTO ) 97-103 MG Take 1 tablet by mouth 2 (two) times daily. 60 tablet 3   spironolactone  (ALDACTONE ) 25 MG tablet Take 1 tablet (25 mg total) by mouth daily. 90 tablet 3   No current facility-administered medications for this visit.   There were no vitals filed for this visit.    Wt Readings from Last 3 Encounters:  10/16/23 108.2 kg (238 lb 9.6 oz)  07/11/23 108.4 kg (239 lb)  01/03/23 107.1 kg (236 lb 2 oz)    PHYSICAL EXAM: General:  *** appearing.  No respiratory difficulty Neck: JVD *** cm.  Cor: Regular rate & rhythm. No murmurs. Lungs: clear Extremities: no edema  Neuro: alert & oriented x 3. Affect pleasant.   ASSESSMENT & PLAN:  1. Chronic Systolic Heart Failure:  Nonischemic cardiomyopathy (ETOH may have played a role), echo with EF 10% in 3/15. ECHO 07/2014 40-45%.  - cMRI 10/2013 with LVEF 31%, Normal RV, No LGE.  - Echo 7/18 EF 35-40%  - Echo 12/22 EF 45-50% - Echo 7/23 EF 35-40% (started flecainide  3/23 for PVC suppression) - cMRI 5/24 EF 40% RV 49% nonspecific 49% - Doing great NYHA I *** - Volume looks good - Not on diuretics - Continue spiro 25 mg daily - Continue coreg  25 mg BID - Continue Entresto  to 97/103 mg BID.  - Continue Jardiance  10 daily. Denies GU symptoms *** - Started flecainide  75 bid in 3/23. PVCs appears suppressed - Recent labs ok  - Update echo ***  2. NSVT:  - Resolved. EF improved to out of ICD range.  - no change  3. LV thrombus:  - resolved  4. Mild OSA:  - Per Dr Jude mild sleep apnea in 5/15 No intervention needed.                                                                                          5. Hyperlipidemia:  - Continue statin.   - PCP following lipids.   6. Frequent PVCs. - high burden on Zio in 2022 - Started Flecainide  3/23. - Zio 9/25 with 11.1% PVC burden ***  Beckey LITTIE Coe, NP  02/20/2024

## 2024-02-20 NOTE — Telephone Encounter (Signed)
 Called to confirm/remind patient of their appointment at the [facility] on 02/21/24 11:00.   Appointment:   [x] Confirmed  [] Left mess   [] No answer/No voice mail  [] VM Full/unable to leave message  [] Phone not in service  Patient reminded to bring all medications and/or complete list.  Confirmed patient has transportation. Gave directions, instructed to utilize valet parking.

## 2024-02-21 ENCOUNTER — Ambulatory Visit (HOSPITAL_COMMUNITY)
Admission: RE | Admit: 2024-02-21 | Discharge: 2024-02-21 | Disposition: A | Source: Ambulatory Visit | Attending: Internal Medicine

## 2024-02-21 ENCOUNTER — Encounter (HOSPITAL_COMMUNITY): Payer: Self-pay

## 2024-02-21 ENCOUNTER — Other Ambulatory Visit (HOSPITAL_COMMUNITY): Payer: Self-pay

## 2024-02-21 ENCOUNTER — Other Ambulatory Visit: Payer: Self-pay

## 2024-02-21 ENCOUNTER — Ambulatory Visit (HOSPITAL_COMMUNITY): Payer: Self-pay | Admitting: Internal Medicine

## 2024-02-21 VITALS — BP 124/74 | HR 56 | Ht 71.0 in | Wt 235.8 lb

## 2024-02-21 DIAGNOSIS — F1090 Alcohol use, unspecified, uncomplicated: Secondary | ICD-10-CM | POA: Diagnosis not present

## 2024-02-21 DIAGNOSIS — G4733 Obstructive sleep apnea (adult) (pediatric): Secondary | ICD-10-CM | POA: Diagnosis not present

## 2024-02-21 DIAGNOSIS — I493 Ventricular premature depolarization: Secondary | ICD-10-CM | POA: Diagnosis not present

## 2024-02-21 DIAGNOSIS — Z79899 Other long term (current) drug therapy: Secondary | ICD-10-CM | POA: Diagnosis not present

## 2024-02-21 DIAGNOSIS — E669 Obesity, unspecified: Secondary | ICD-10-CM | POA: Diagnosis not present

## 2024-02-21 DIAGNOSIS — I110 Hypertensive heart disease with heart failure: Secondary | ICD-10-CM | POA: Diagnosis not present

## 2024-02-21 DIAGNOSIS — E785 Hyperlipidemia, unspecified: Secondary | ICD-10-CM | POA: Diagnosis not present

## 2024-02-21 DIAGNOSIS — I4729 Other ventricular tachycardia: Secondary | ICD-10-CM | POA: Diagnosis not present

## 2024-02-21 DIAGNOSIS — I428 Other cardiomyopathies: Secondary | ICD-10-CM | POA: Diagnosis not present

## 2024-02-21 DIAGNOSIS — I472 Ventricular tachycardia, unspecified: Secondary | ICD-10-CM | POA: Diagnosis not present

## 2024-02-21 DIAGNOSIS — Z6832 Body mass index (BMI) 32.0-32.9, adult: Secondary | ICD-10-CM | POA: Diagnosis not present

## 2024-02-21 DIAGNOSIS — I5022 Chronic systolic (congestive) heart failure: Secondary | ICD-10-CM | POA: Diagnosis not present

## 2024-02-21 DIAGNOSIS — R008 Other abnormalities of heart beat: Secondary | ICD-10-CM | POA: Diagnosis not present

## 2024-02-21 LAB — BASIC METABOLIC PANEL WITH GFR
Anion gap: 5 (ref 5–15)
BUN: 13 mg/dL (ref 6–20)
CO2: 26 mmol/L (ref 22–32)
Calcium: 9.4 mg/dL (ref 8.9–10.3)
Chloride: 104 mmol/L (ref 98–111)
Creatinine, Ser: 1.1 mg/dL (ref 0.61–1.24)
GFR, Estimated: >60 mL/min (ref >60)
Glucose, Bld: 100 mg/dL — ABNORMAL HIGH (ref 70–99)
Potassium: 4.1 mmol/L (ref 3.5–5.1)
Sodium: 135 mmol/L (ref 135–145)

## 2024-02-21 LAB — BRAIN NATRIURETIC PEPTIDE: B Natriuretic Peptide: 27.3 pg/mL (ref 0.0–100.0)

## 2024-02-21 LAB — MAGNESIUM: Magnesium: 2 mg/dL (ref 1.7–2.4)

## 2024-02-21 MED ORDER — FARXIGA 10 MG PO TABS
10.0000 mg | ORAL_TABLET | Freq: Every day | ORAL | 6 refills | Status: AC
  Filled 2024-02-21 (×2): qty 30, 30d supply, fill #0
  Filled 2024-04-01: qty 30, 30d supply, fill #1

## 2024-02-21 MED ORDER — DAPAGLIFLOZIN PROPANEDIOL 10 MG PO TABS
10.0000 mg | ORAL_TABLET | Freq: Every day | ORAL | 6 refills | Status: DC
  Filled 2024-02-21: qty 30, 30d supply, fill #0

## 2024-02-21 NOTE — Patient Instructions (Signed)
 Medication Changes:  STOP Jardiance   START Farxiga 10 mg Daily  Lab Work:  Labs done today, your results will be available in MyChart, we will contact you for abnormal readings.  Testing/Procedures:  Your physician has requested that you have an echocardiogram. Echocardiography is a painless test that uses sound waves to create images of your heart. It provides your doctor with information about the size and shape of your heart and how well your heart's chambers and valves are working. This procedure takes approximately one hour. There are no restrictions for this procedure. Please do NOT wear cologne, perfume, aftershave, or lotions (deodorant is allowed). Please arrive 15 minutes prior to your appointment time.  Please note: We ask at that you not bring children with you during ultrasound (echo/ vascular) testing. Due to room size and safety concerns, children are not allowed in the ultrasound rooms during exams. Our front office staff cannot provide observation of children in our lobby area while testing is being conducted. An adult accompanying a patient to their appointment will only be allowed in the ultrasound room at the discretion of the ultrasound technician under special circumstances. We apologize for any inconvenience.    Special Instructions // Education:  Do the following things EVERYDAY: Weigh yourself in the morning before breakfast. Write it down and keep it in a log. Take your medicines as prescribed Eat low salt foods--Limit salt (sodium) to 2000 mg per day.  Stay as active as you can everyday Limit all fluids for the day to less than 2 liters   Follow-Up in: 1 month   At the [facility], you and your health needs are our priority. We have a designated team specialized in the treatment of Heart Failure. This Care Team includes your primary Heart Failure Specialized Cardiologist (physician), Advanced Practice Providers (APPs- Physician Assistants  and Nurse Practitioners), and Pharmacist who all work together to provide you with the care you need, when you need it.   You may see any of the following providers on your designated Care Team at your next follow up:  Dr. Toribio Fuel Dr. Ezra Shuck Dr. Odis Brownie Greig Mosses, NP Caffie Shed, [place] [facility] [place], [place] Beckey Coe, NP Jordan Lee, NP Tinnie Redman, PharmD   Please be sure to bring in all your medications bottles to every appointment.   Need to Contact Us :  If you have any questions or concerns before your next appointment please send us  a message through [place] or call our office at [phone].    TO LEAVE A MESSAGE FOR THE NURSE SELECT OPTION 2, PLEASE LEAVE A MESSAGE INCLUDING: YOUR NAME DATE OF BIRTH CALL BACK NUMBER REASON FOR CALL**this is important as we prioritize the call backs  YOU WILL RECEIVE A CALL BACK THE SAME DAY AS LONG AS YOU CALL BEFORE 4:00 PM

## 2024-03-03 ENCOUNTER — Ambulatory Visit (HOSPITAL_COMMUNITY): Admission: RE | Admit: 2024-03-03 | Discharge: 2024-03-03 | Attending: Internal Medicine

## 2024-03-03 DIAGNOSIS — I358 Other nonrheumatic aortic valve disorders: Secondary | ICD-10-CM | POA: Diagnosis not present

## 2024-03-03 DIAGNOSIS — I110 Hypertensive heart disease with heart failure: Secondary | ICD-10-CM | POA: Diagnosis not present

## 2024-03-03 DIAGNOSIS — I5022 Chronic systolic (congestive) heart failure: Secondary | ICD-10-CM | POA: Diagnosis not present

## 2024-03-03 DIAGNOSIS — E785 Hyperlipidemia, unspecified: Secondary | ICD-10-CM | POA: Diagnosis not present

## 2024-03-03 LAB — ECHOCARDIOGRAM COMPLETE
Area-P 1/2: 3.24 cm2
Calc EF: 47.7 %
S' Lateral: 4.1 cm
Single Plane A2C EF: 45.1 %
Single Plane A4C EF: 47.8 %

## 2024-03-16 ENCOUNTER — Other Ambulatory Visit (HOSPITAL_COMMUNITY): Payer: Self-pay

## 2024-03-18 ENCOUNTER — Ambulatory Visit (INDEPENDENT_AMBULATORY_CARE_PROVIDER_SITE_OTHER): Admitting: Family Medicine

## 2024-03-18 ENCOUNTER — Encounter: Payer: Self-pay | Admitting: Family Medicine

## 2024-03-18 VITALS — BP 110/80 | HR 69 | Temp 98.1°F | Resp 18 | Ht 72.0 in | Wt 237.4 lb

## 2024-03-18 DIAGNOSIS — Z125 Encounter for screening for malignant neoplasm of prostate: Secondary | ICD-10-CM

## 2024-03-18 DIAGNOSIS — I10 Essential (primary) hypertension: Secondary | ICD-10-CM | POA: Diagnosis not present

## 2024-03-18 DIAGNOSIS — Z23 Encounter for immunization: Secondary | ICD-10-CM | POA: Diagnosis not present

## 2024-03-18 DIAGNOSIS — M5441 Lumbago with sciatica, right side: Secondary | ICD-10-CM | POA: Diagnosis not present

## 2024-03-18 DIAGNOSIS — M5442 Lumbago with sciatica, left side: Secondary | ICD-10-CM

## 2024-03-18 DIAGNOSIS — Z0000 Encounter for general adult medical examination without abnormal findings: Secondary | ICD-10-CM | POA: Diagnosis not present

## 2024-03-18 DIAGNOSIS — G8929 Other chronic pain: Secondary | ICD-10-CM

## 2024-03-18 LAB — BASIC METABOLIC PANEL WITH GFR
BUN: 17 mg/dL (ref 6–23)
CO2: 28 meq/L (ref 19–32)
Calcium: 9.3 mg/dL (ref 8.4–10.5)
Chloride: 102 meq/L (ref 96–112)
Creatinine, Ser: 1.03 mg/dL (ref 0.40–1.50)
GFR: 83.96 mL/min
Glucose, Bld: 88 mg/dL (ref 70–99)
Potassium: 4.3 meq/L (ref 3.5–5.1)
Sodium: 137 meq/L (ref 135–145)

## 2024-03-18 LAB — CBC WITH DIFFERENTIAL/PLATELET
Basophils Absolute: 0 K/uL (ref 0.0–0.1)
Basophils Relative: 0.3 % (ref 0.0–3.0)
Eosinophils Absolute: 0.1 K/uL (ref 0.0–0.7)
Eosinophils Relative: 1.8 % (ref 0.0–5.0)
HCT: 47.5 % (ref 39.0–52.0)
Hemoglobin: 16.1 g/dL (ref 13.0–17.0)
Lymphocytes Relative: 31.6 % (ref 12.0–46.0)
Lymphs Abs: 2.2 K/uL (ref 0.7–4.0)
MCHC: 33.8 g/dL (ref 30.0–36.0)
MCV: 91.4 fl (ref 78.0–100.0)
Monocytes Absolute: 0.6 K/uL (ref 0.1–1.0)
Monocytes Relative: 8.1 % (ref 3.0–12.0)
Neutro Abs: 4.1 K/uL (ref 1.4–7.7)
Neutrophils Relative %: 58.2 % (ref 43.0–77.0)
Platelets: 224 K/uL (ref 150.0–400.0)
RBC: 5.19 Mil/uL (ref 4.22–5.81)
RDW: 12 % (ref 11.5–15.5)
WBC: 7 K/uL (ref 4.0–10.5)

## 2024-03-18 LAB — HEPATIC FUNCTION PANEL
ALT: 32 U/L (ref 3–53)
AST: 20 U/L (ref 5–37)
Albumin: 4.6 g/dL (ref 3.5–5.2)
Alkaline Phosphatase: 37 U/L — ABNORMAL LOW (ref 39–117)
Bilirubin, Direct: 0.2 mg/dL (ref 0.1–0.3)
Total Bilirubin: 1 mg/dL (ref 0.2–1.2)
Total Protein: 7.1 g/dL (ref 6.0–8.3)

## 2024-03-18 LAB — LIPID PANEL
Cholesterol: 125 mg/dL (ref 28–200)
HDL: 19.9 mg/dL — ABNORMAL LOW
LDL Cholesterol: 76 mg/dL (ref 10–99)
NonHDL: 104.74
Total CHOL/HDL Ratio: 6
Triglycerides: 146 mg/dL (ref 10.0–149.0)
VLDL: 29.2 mg/dL (ref 0.0–40.0)

## 2024-03-18 LAB — PSA: PSA: 0.65 ng/mL (ref 0.10–4.00)

## 2024-03-18 LAB — TSH: TSH: 2.21 u[IU]/mL (ref 0.35–5.50)

## 2024-03-18 NOTE — Progress Notes (Signed)
" ° °  Subjective:    Patient ID: [name], male    DOB: 04/15/1970, 53 y.o.   MRN: [id number]  HPI CPE- UTD on cologuard, Tdap.  Considering flu  Health Maintenance  Topic Date Due   Influenza Vaccine  10/19/2023   COVID-19 Vaccine (2 - 2025-26 season) 04/03/2024 (Originally 11/19/2023)   Zoster Vaccines- Shingrix (1 of 2) 06/16/2024 (Originally 06/17/2022)   Pneumococcal Vaccine: 50+ Years (2 of 2 - PCV) 03/18/2025 (Originally 06/04/2014)   Hepatitis B Vaccines 19-59 Average Risk (1 of 3 - 19+ 3-dose series) 03/18/2025 (Originally 06/17/1991)   Fecal DNA (Cologuard)  02/19/2026   DTaP/Tdap/Td (3 - Td or Tdap) 10/31/2028   Hepatitis C Screening  Completed   HIV Screening  Completed   HPV VACCINES  Aged Out   Meningococcal B Vaccine  Aged Out    Patient Care Team    Relationship Specialty Notifications Start End  Mahlon Comer BRAVO, MD PCP - General Family Medicine  08/06/13   Bensimhon, Toribio SAUNDERS, MD PCP - Advanced [facility] Cardiology  02/18/21       Review of Systems Patient reports no vision/hearing changes, anorexia, fever ,adenopathy, persistant/recurrent hoarseness, swallowing issues, chest pain, palpitations, edema, persistant/recurrent cough, hemoptysis, dyspnea (rest,exertional, paroxysmal nocturnal), gastrointestinal  bleeding (melena, rectal bleeding), abdominal pain, excessive heart burn, GU symptoms (dysuria, hematuria, voiding/incontinence issues) syncope, focal weakness, memory loss, numbness & tingling, skin/hair/nail changes, depression, anxiety, abnormal bruising/bleeding  + LBP- ongoing for months, worse w/ prolonged standing.  Will radiate over hip and down front of thighs.    Objective:   Physical Exam General Appearance:    Alert, cooperative, no distress, appears stated age  Head:    Normocephalic, without obvious abnormality, atraumatic  Eyes:    PERRL, conjunctiva/corneas clear, EOM's intact both eyes       Ears:    Normal TM's and external ear  canals, both ears  Nose:   Nares normal, septum midline, mucosa normal, no drainage   or sinus tenderness  Throat:   Lips, mucosa, and tongue normal; teeth and gums normal  Neck:   Supple, symmetrical, trachea midline, no adenopathy;       thyroid :  No enlargement/tenderness/nodules  Back:     Symmetric, no curvature, ROM normal, no CVA tenderness  Lungs:     Clear to auscultation bilaterally, respirations unlabored  Chest wall:    No tenderness or deformity  Heart:    Regular rate and rhythm, S1 and S2 normal, no murmur, rub   or gallop  Abdomen:     Soft, non-tender, bowel sounds active all four quadrants,    no masses, no organomegaly  Genitalia:    deferred  Rectal:    Extremities:   Extremities normal, atraumatic, no cyanosis or edema  Pulses:   2+ and symmetric all extremities  Skin:   Skin color, texture, turgor normal, no rashes or lesions  Lymph nodes:   Cervical, supraclavicular, and axillary nodes normal  Neurologic:   CNII-XII intact. Normal strength, sensation and reflexes      throughout          Assessment & Plan:    "

## 2024-03-18 NOTE — Patient Instructions (Signed)
 Follow up in 6 months to recheck blood pressure and cholesterol We'll notify you of your lab results and make any changes if needed Keep up the good work on healthy diet and regular exercise- you're doing great! Call with any questions or concerns Stay Safe!  Stay Healthy! Happy New Year!

## 2024-03-18 NOTE — Assessment & Plan Note (Signed)
 Pt's PE WNL w/ exception of BMI.  UTD on Cologuard and Tdap.  Flu shot given today.  Check labs.  Anticipatory guidance provided.

## 2024-03-18 NOTE — Addendum Note (Signed)
 Addended by: Jarone Ostergaard A on: 03/18/2024 09:01 AM   Modules accepted: Orders

## 2024-03-19 ENCOUNTER — Ambulatory Visit: Payer: Self-pay | Admitting: Family Medicine

## 2024-03-19 NOTE — Progress Notes (Signed)
 Lab results have been discussed.   Verbalized understanding? Yes  Are there any questions? No

## 2024-03-24 ENCOUNTER — Other Ambulatory Visit (HOSPITAL_COMMUNITY): Payer: Self-pay

## 2024-03-24 ENCOUNTER — Ambulatory Visit (HOSPITAL_COMMUNITY)
Admission: RE | Admit: 2024-03-24 | Discharge: 2024-03-24 | Disposition: A | Discharge status: Home / Self Care | Source: Ambulatory Visit | Attending: Internal Medicine | Admitting: Internal Medicine

## 2024-03-24 ENCOUNTER — Encounter (HOSPITAL_COMMUNITY): Payer: Self-pay | Admitting: Internal Medicine

## 2024-03-24 VITALS — BP 138/78 | HR 53 | Wt 236.8 lb

## 2024-03-24 DIAGNOSIS — Z7984 Long term (current) use of oral hypoglycemic drugs: Secondary | ICD-10-CM | POA: Insufficient documentation

## 2024-03-24 DIAGNOSIS — I5022 Chronic systolic (congestive) heart failure: Secondary | ICD-10-CM | POA: Insufficient documentation

## 2024-03-24 DIAGNOSIS — I493 Ventricular premature depolarization: Secondary | ICD-10-CM | POA: Insufficient documentation

## 2024-03-24 DIAGNOSIS — Z79899 Other long term (current) drug therapy: Secondary | ICD-10-CM | POA: Diagnosis not present

## 2024-03-24 DIAGNOSIS — I428 Other cardiomyopathies: Secondary | ICD-10-CM | POA: Diagnosis present

## 2024-03-24 DIAGNOSIS — Z7982 Long term (current) use of aspirin: Secondary | ICD-10-CM | POA: Insufficient documentation

## 2024-03-24 DIAGNOSIS — F1090 Alcohol use, unspecified, uncomplicated: Secondary | ICD-10-CM | POA: Diagnosis present

## 2024-03-24 DIAGNOSIS — R0683 Snoring: Secondary | ICD-10-CM | POA: Diagnosis not present

## 2024-03-24 DIAGNOSIS — F1091 Alcohol use, unspecified, in remission: Secondary | ICD-10-CM | POA: Insufficient documentation

## 2024-03-24 DIAGNOSIS — G4733 Obstructive sleep apnea (adult) (pediatric): Secondary | ICD-10-CM | POA: Diagnosis not present

## 2024-03-24 DIAGNOSIS — Z6832 Body mass index (BMI) 32.0-32.9, adult: Secondary | ICD-10-CM | POA: Diagnosis not present

## 2024-03-24 DIAGNOSIS — E669 Obesity, unspecified: Secondary | ICD-10-CM | POA: Diagnosis not present

## 2024-03-24 MED ORDER — FLECAINIDE ACETATE 100 MG PO TABS
100.0000 mg | ORAL_TABLET | Freq: Two times a day (BID) | ORAL | 6 refills | Status: AC
  Filled 2024-03-24 – 2024-04-14 (×2): qty 60, 30d supply, fill #0

## 2024-03-24 NOTE — Progress Notes (Signed)
 ITAMAR home sleep study given to patient, all instructions explained, waiver signed, and CLOUDPAT registration complete.

## 2024-03-24 NOTE — Patient Instructions (Addendum)
 Medication Changes:  INCREASE Flecainide  to 100 mg Twice daily    Testing/Procedures:  Your provider has recommended that you have a home sleep study (Itamar Test).  We have provided you with the equipment in our office today. Please go ahead and download the app. DO NOT OPEN OR TAMPER WITH THE BOX UNTIL WE ADVISE YOU TO DO SO. Once insurance has approved the test our office will call you with PIN number and approval to proceed with testing. Once you have completed the test you just dispose of the equipment, the information is automatically uploaded to us  via blue-tooth technology. If your test is positive for sleep apnea and you need a home CPAP machine you will be contacted by Dr Dorine office [facility]) to set this up.   Your physician has requested that you have an echocardiogram. Echocardiography is a painless test that uses sound waves to create images of your heart. It provides your doctor with information about the size and shape of your heart and how well your hearts chambers and valves are working. This procedure takes approximately one hour. There are no restrictions for this procedure. Please do NOT wear cologne, perfume, aftershave, or lotions (deodorant is allowed). Please arrive 15 minutes prior to your appointment time. IN 6 MONTHS  Please note: We ask at that you not bring children with you during ultrasound (echo/ vascular) testing. Due to room size and safety concerns, children are not allowed in the ultrasound rooms during exams. Our front office staff cannot provide observation of children in our lobby area while testing is being conducted. An adult accompanying a patient to their appointment will only be allowed in the ultrasound room at the discretion of the ultrasound technician under special circumstances. We apologize for any inconvenience.   Referrals:  You have been referred to Pharmacy Clinic to   Special Instructions // Education:  Do the following  things EVERYDAY: Weigh yourself in the morning before breakfast. Write it down and keep it in a log. Take your medicines as prescribed Eat low salt foods--Limit salt (sodium) to 2000 mg per day.  Stay as active as you can everyday Limit all fluids for the day to less than 2 liters   Follow-Up in: 6 months with an echocardiogram (June 2026), **PLEASE CALL OUR OFFICE IN APRIL TO SCHEDULE THESE APPOINTMENTS   At the [facility], you and your health needs are our priority. We have a designated team specialized in the treatment of Heart Failure. This Care Team includes your primary Heart Failure Specialized Cardiologist (physician), Advanced Practice Providers (APPs- Physician Assistants and Nurse Practitioners), and Pharmacist who all work together to provide you with the care you need, when you need it.   You may see any of the following providers on your designated Care Team at your next follow up:  Dr. Toribio Fuel Dr. Ezra Shuck Dr. Odis Brownie Greig Mosses, NP Caffie Shed, [place] [facility] [place], [place] Beckey Coe, NP Jordan Lee, NP Tinnie Redman, PharmD   Please be sure to bring in all your medications bottles to every appointment.   Need to Contact Us :  If you have any questions or concerns before your next appointment please send us  a message through [place] or call our office at [phone].    TO LEAVE A MESSAGE FOR THE NURSE SELECT OPTION 2, PLEASE LEAVE A MESSAGE INCLUDING: YOUR NAME DATE OF BIRTH CALL BACK NUMBER REASON FOR CALL**this is important as we prioritize the call backs  YOU WILL RECEIVE A CALL BACK THE SAME DAY AS LONG AS YOU CALL BEFORE 4:00 PM

## 2024-03-24 NOTE — Progress Notes (Signed)
 "             ADVANCED HF CLINIC NOTE   Patient ID: [name], male   DOB: 24-Aug-1970, 54 y.o.   MRN: [id number] PCP: Dr Mahlon Pulmonary: Dr Jude  HPI: [name] is a 54 y.o. male with a history of ETOH consumption, obesity, and systolic heart failure due to NICM (onset 3/15)  Admitted 3/15 with acute systolic heart failure. Echo EF 10% with global HK, large LV thrombus and severe RV dysfunction.  LHC 05/2013 normal cors. Required short course of Milrinone .  cMRI 8/15 EF 31% no scar  Echo 12/22 EF 45-50%  Zio 12/22:  - Isolated PVCs were frequent (16.8%, [id number]), - mostly one morphology  - PVCs couplets were occasional (4.3%, 15331)  Unable to tolerate mexilitene. In 3/23 started on flecainide   Echo 7/23 EF 35-40%  cMRI 5/24 EF 40% RV 49% nonspecific 49%  Zio 9/25 with 11.1% PVC burden   Echo 03/03/24: EF 35-40%   Today he returns for AHF follow up with his wife. Feels good. No CP or SOB. No palpitations. Stopped drinking for 1 month and snoring better but otherwise snores severely      Studies: ECHO 06/01/13 EF 10%  ECHO 09/03/13  EF 25-30% RV normal No LV thrombus ECHO 5/16 EF 40-45%,  Echo 2/20 EF 45-50%  CPX (2/16) with peak VO2 27.4, VE/VCO2 slope 27.4, RER 1.28 => low normal functional capacity.                                                                                                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 Prior heavy ETOH, now rare.   FH: Mom: HTN, DM       Grandmother: HTN, DM, heart disease   Review of systems complete and found to be negative unless listed in HPI.    Past Medical History:  Diagnosis Date   Athlete's foot    CHF (congestive heart failure) (HCC)    a) NICM b) ECHO (05/2013) EF 10%, diff HK, large mural thrombus, mild MR, LA sev. dilated, RA mildly dilated, mod TR c) Swan GC (06/02/13): CVP 19, PA 41/25 (33), PCWP 29, Thermo CO/CI 2.4 / 1.1 d) cMRI EF 31%, no definite  myocardial delayed enhancement    Hypertension    NICM (nonischemic cardiomyopathy) (HCC)    a) LHC (06/04/2013): normal coronary arteries    Current Outpatient Medications  Medication Sig Dispense Refill   albuterol  (VENTOLIN  HFA) 108 (90 Base) MCG/ACT inhaler Inhale 2 puffs into the lungs every 6 (six) hours as needed for wheezing or shortness of breath. 6.7 g 0   aspirin  EC 81 MG tablet Take 81 mg by mouth daily.     atorvastatin  (LIPITOR) 20 MG tablet Take 1 tablet (20 mg total) by mouth daily. 90 tablet 1   carvedilol  (COREG ) 25 MG tablet Take 1 tablet (25 mg total) by mouth 2 (two) times daily with a meal. 180 tablet 3   FARXIGA  10 MG TABS tablet Take 1 tablet (10 mg  total) by mouth daily before breakfast. 30 tablet 6   fenofibrate  160 MG tablet Take 1 tablet (160 mg total) by mouth daily. 90 tablet 3   flecainide  (TAMBOCOR ) 50 MG tablet Take 1.5 tablets (75 mg total) by mouth 2 (two) times daily. 90 tablet 3   methocarbamol  (ROBAXIN ) 500 MG tablet Take 1 tablet (500 mg total) by mouth every 8 (eight) hours as needed for muscle spasms. 30 tablet 3   sacubitril -valsartan  (ENTRESTO ) 97-103 MG Take 1 tablet by mouth 2 (two) times daily. 60 tablet 3   spironolactone  (ALDACTONE ) 25 MG tablet Take 1 tablet (25 mg total) by mouth daily. 90 tablet 3   No current facility-administered medications for this encounter.   Vitals:   03/24/24 1043  BP: 138/78  Pulse: (!) 53  SpO2: 99%  Weight: 107.4 kg (236 lb 12.8 oz)      Wt Readings from Last 3 Encounters:  03/24/24 107.4 kg (236 lb 12.8 oz)  03/18/24 107.7 kg (237 lb 6.4 oz)  02/21/24 107 kg (235 lb 12.8 oz)    Body mass index is 32.12 kg/m.  PHYSICAL EXAM: General:  Sitting up. No resp difficulty HEENT: normal Neck: supple. no JVD.  Cor: Mildly irregular rate & rhythm. No rubs, gallops or murmurs. Lungs: clear Abdomen: soft, nontender, nondistended.Good bowel sounds. Extremities: no cyanosis, clubbing, rash, edema Neuro:  alert & orientedx3, cranial nerves grossly intact. moves all 4 extremities w/o difficulty. 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 - Zio 9/25 with 11.1% PVC burden despite flecainide  75 bid - Discussed options with him of referral to EP for PVC ablation versus 6 month course of very intensive medical intervention (increase flecainide  to 100 bid, weight loss with GLP1, ETOH cessation and repeat sleep study) - He would like to try medical intervention as above  Chronic Systolic Heart Failure: Nonischemic cardiomyopathy (ETOH may have played a role), echo with EF 10% in 3/15. ECHO 07/2014 40-45%.  - cMRI 10/2013 with LVEF 31%, Normal RV, No LGE.  - Echo 7/18 EF 35-40%  - Echo 12/22 EF 45-50% - Echo 7/23 EF 35-40% (started flecainide  3/23 for PVC suppression) - cMRI 5/24 EF 40% RV 49%. ECV mildly elevated at 36%, not suggestive of cardiac amyloid - Echo 03/03/24: EF 35-40%  - NYHA I - Continue spiro 25 mg daily - Continue coreg  25 mg BID - Continue Entresto  to 97/103 mg BID.  - Continue Farxiga  10  - EF remains depressed despite optimal GDMT. Unclear if PVC mediated or ETOH or genetic - Plan as above  - Repeat echo 6 months  NSVT:  - Resolved. EF improved to out of ICD range.  - No change  LV thrombus:  - resolved  OSA:  - Per Dr Jude mild sleep apnea in 5/15 - Repeat HST  Obesity - Body mass index is 32.12 kg/m. - Refer to PharmD for [facility]  Toribio Fuel, MD  03/24/2024   "

## 2024-03-30 ENCOUNTER — Encounter: Payer: Self-pay | Admitting: Internal Medicine

## 2024-03-31 ENCOUNTER — Ambulatory Visit (HOSPITAL_COMMUNITY)
Admission: RE | Admit: 2024-03-31 | Discharge: 2024-03-31 | Disposition: A | Source: Ambulatory Visit | Attending: Cardiology

## 2024-03-31 ENCOUNTER — Other Ambulatory Visit (HOSPITAL_COMMUNITY): Payer: Self-pay

## 2024-03-31 DIAGNOSIS — I5022 Chronic systolic (congestive) heart failure: Secondary | ICD-10-CM | POA: Diagnosis present

## 2024-03-31 MED ORDER — MELOXICAM 15 MG PO TABS
15.0000 mg | ORAL_TABLET | Freq: Every day | ORAL | 2 refills | Status: AC
  Filled 2024-03-31: qty 60, 60d supply, fill #0

## 2024-04-01 ENCOUNTER — Other Ambulatory Visit (HOSPITAL_COMMUNITY): Payer: Self-pay

## 2024-04-03 ENCOUNTER — Other Ambulatory Visit (HOSPITAL_COMMUNITY): Payer: Self-pay

## 2024-04-14 ENCOUNTER — Other Ambulatory Visit: Payer: Self-pay

## 2024-04-17 ENCOUNTER — Other Ambulatory Visit (HOSPITAL_COMMUNITY): Payer: Self-pay

## 2024-04-23 ENCOUNTER — Ambulatory Visit

## 2024-04-23 VITALS — Ht 72.0 in | Wt 232.8 lb

## 2024-04-23 DIAGNOSIS — G4733 Obstructive sleep apnea (adult) (pediatric): Secondary | ICD-10-CM

## 2024-04-23 NOTE — Patient Instructions (Addendum)
 I will work on getting GLP-1RA approved once you complete your sleep study  GLP-1 Receptor Agonist Counseling Points This medication reduces your appetite and may make you feel fuller longer.  Stop eating when your body tells you that you are full. This will likely happen sooner than you are used to. Fried/greasy food and sweets may upset your stomach - minimize these as much as possible. Store your medication in the fridge until you are ready to use it. Inject your medication in the fatty tissue of your lower abdominal area (2 inches away from belly button) or upper outer thigh. Rotate injection sites. Common side effects include: nausea, diarrhea/constipation, and heartburn, and are more likely to occur if you overeat. Stop your injection for 7 days prior to surgical procedures requiring anesthesia.  Dosing schedule:  We will touch base with you monthly over the phone. The medication can be increased in monthly intervals depending on tolerability and efficacy.  Tips for success: Write down the reasons why you want to lose weight and post it in a place where you'll see it often.  Start small and work your way up. Keep in mind that it takes time to achieve goals, and small steps add up.  Any additional movements help to burn calories. Taking the stairs rather than the elevator and parking at the far end of your parking lot are easy ways to start. Brisk walking for at least 30 minutes 4 or more days of the week is an excellent goal to work toward  Understanding what it means to feel full: Did you know that it can take 15 minutes or more for your brain to receive the message that you've eaten? That means that, if you eat less food, but consume it slower, you may still feel satisfied.  Eating a lot of fruits and vegetables can also help you feel fuller.  Eat off of smaller plates so that moderate portions don't seem too small  Tips for living a healthier life     Building a Healthy and  Balanced Diet Make most of your meal vegetables and fruits -  of your plate. Aim for color and variety, and remember that potatoes dont count as vegetables on the Healthy Eating Plate because of their negative impact on blood sugar.  Go for whole grains -  of your plate. Whole and intact grains--whole wheat, barley, wheat berries, quinoa, oats, brown rice, and foods made with them, such as whole wheat pasta--have a milder effect on blood sugar and insulin than Meli Faley bread, Julia Alkhatib rice, and other refined grains.  Protein power -  of your plate. Fish, poultry, beans, and nuts are all healthy, versatile protein sources--they can be mixed into salads, and pair well with vegetables on a plate. Limit red meat, and avoid processed meats such as bacon and sausage.  Healthy plant oils - in moderation. Choose healthy vegetable oils like olive, canola, soy, corn, sunflower, peanut, and others, and avoid partially hydrogenated oils, which contain unhealthy trans fats. Remember that low-fat does not mean healthy.  Drink water, coffee, or tea. Skip sugary drinks, limit milk and dairy products to one to two servings per day, and limit juice to a small glass per day.  Stay active. The red figure running across the Healthy Eating Plates placemat is a reminder that staying active is also important in weight control.  The main message of the Healthy Eating Plate is to focus on diet quality:  The type of carbohydrate in the diet  is more important than the amount of carbohydrate in the diet, because some sources of carbohydrate--like vegetables (other than potatoes), fruits, whole grains, and beans--are healthier than others. The Healthy Eating Plate also advises consumers to avoid sugary beverages, a major source of calories--usually with little nutritional value--in the American diet. The Healthy Eating Plate encourages consumers to use healthy oils, and it does not set a maximum on the percentage of  calories people should get each day from healthy sources of fat. In this way, the Healthy Eating Plate recommends the opposite of the low-fat message promoted for decades by the USDA.  cuetune.com.ee  SUGAR  Sugar is a huge problem in the modern day diet. Sugar is a big contributor to heart disease, diabetes, high triglyceride levels, fatty liver disease and obesity. Sugar is hidden in almost all packaged foods/beverages. Added sugar is extra sugar that is added beyond what is naturally found and has no nutritional benefit for your body. The American Heart Association recommends limiting added sugars to no more than 25g for women and 36 grams for men per day. There are many names for sugar including maltose, sucrose (names ending in ose), high fructose corn syrup, molasses, cane sugar, corn sweetener, raw sugar, syrup, honey or fruit juice concentrate.   One of the best ways to limit your added sugars is to stop drinking sweetened beverages such as soda, sweet tea, and fruit juice.  There is 65g of added sugars in one 20oz bottle of Coke! That is equal to 7.5 donuts.   Pay attention and read all nutrition facts labels. Below is an examples of a nutrition facts label. The #1 is showing you the total sugars where the # 2 is showing you the added sugars. This one serving has almost the max amount of added sugars per day!   EXERCISE  Exercise is good. Weve all heard that. In an ideal world, we would all have time and resources to get plenty of it. When you are active, your heart pumps more efficiently and you will feel better.  Multiple studies show that even walking regularly has benefits that include living a longer life. The American Heart Association recommends 150 minutes per week of exercise (30 minutes per day most days of the week). You can do this in any increment you wish. Nine or more 10-minute walks count. So does an hour-long exercise  class. Break the time apart into what will work in your life. Some of the best things you can do include walking briskly, jogging, cycling or swimming laps. Not everyone is ready to exercise. Sometimes we need to start with just getting active. Here are some easy ways to be more active throughout the day:  Take the stairs instead of the elevator  Go for a 10-15 minute walk during your lunch break (find a friend to make it more enjoyable)  When shopping, park at the back of the parking lot  If you take public transportation, get off one stop early and walk the extra distance  Pace around while making phone calls  Check with your doctor if you arent sure what your limitations may be. Always remember to drink plenty of water when doing any type of exercise. Dont feel like a failure if youre not getting the 90-150 minutes per week. If you started by being a couch potato, then just a 10-minute walk each day is a huge improvement. Start with little victories and work your way up.   HEALTHY EATING TIPS  Plan ahead: make a menu of the meals for a week then create a grocery list to go with that menu. Consider meals that easily stretch into a night of leftovers, such as stews or casseroles. Or consider making two of your favorite meal and put one in the freezer for another night. Try a night or two each week that is meatless or no cook such as salads. When you get home from the grocery store wash and prepare your vegetables and fruits. Then when you need them they are ready to go.   Tips for going to the grocery store:  Buy store or generic brands  Check the weekly ad from your store on-line or in their in-store flyer  Look at the unit price on the shelf tag to compare/contrast the costs of different items  Buy fruits/vegetables in season  Carrots, bananas and apples are low-cost, naturally healthy items  If meats or frozen vegetables are on sale, buy some extras and put in your  freezer  Limit buying prepared or ready to eat items, even if they are pre-made salads or fruit snacks  Do not shop when youre hungry  Foods at eye level tend to be more expensive. Look on the high and low shelves for deals.  Consider shopping at the farmers market for fresh foods in season.  Avoid the cookie and chip aisles (these are expensive, high in calories and low in nutritional value). Shop on the outside of the grocery store.  Healthy food preparations:  If you cant get lean hamburger, be sure to drain the fat when cooking  Steam, saut (in olive oil), grill or bake foods  Experiment with different seasonings to avoid adding salt to your foods. Kosher salt, sea salt and Himalayan salt are all still salt and should be avoided. Try seasoning food with onion, garlic, thyme, rosemary, basil ect. Onion powder or garlic powder is ok. Avoid if it says salt (ie garlic salt).

## 2024-04-23 NOTE — Progress Notes (Signed)
 Patient ID: [name]                 DOB: 05/03/1970                    MRN: [id number]     HPI: [name] is a 54 y.o. male patient referred to pharmacy clinic by Dr. Cherrie to initiate GLP1-RA therapy. PMH is significant for chronic systolic heart failure, HTN, HLD, mild OSA and obesity. Most recent BMI 31.57 kg/m . Most recent A1c is 5.5% on 06/2022.  Patient was last evaluated by Dr. Cherrie in January 2026 for HF follow up appointment. Sleep study in 2015 showed mild OSA (AHI of 7). Dr. Bensimhon ordered a repeat home sleep study at that visit. Patient referred to Pharm[facility] for [facility].   Patient presents today in good spirits. He states he has lost 12 pounds since over the last month exercising on his treadmill daily and cutting out late night snacking. He is very motivated to lose weight naturally but interested in learning more about GLP-1RA therapy. He notes that he has a home sleep study to complete that he will do this weekend. He notes some stress at work that he believes is causing him to wake up throughout the night. He is unable to sleep 6-8 hours per night because of it. He is considering switching jobs and in the process of interviewing for a new job.   Current weight and BMI: 232.8 lbs and 31.57 kg/m  Goal weight: 205 lbs  Current meds that affect weight: Farxiga   Diet: Eating Out: Limited; typically Mexican food on Mondays Breakfast: Cheerios Lunch/Dinner: Soup, mostly chicken (baked) and vegetables  Snacks: Patient has discontinued nighttime snacking; occasionally eats chicken or a hamburger Beverages: Water and coffee  Exercise:  Treadmill 20-25 min daily and he lifts weights too   Family History:  Relation Problem Comments  Mother - Mom (Alive) Diabetes   Hypertension     Father Metallurgist)   Brother Metallurgist)   Brother (Alive)   Maternal Grandmother - Mom (Alive) Diabetes   Heart disease   Hypertension     Maternal Grandfather (Deceased)    Paternal Grandmother (Deceased) Vision loss     Paternal Grandfather (Deceased) Emphysema       Social History: Alcohol: no alcohol since Thanksgiving  Smoking: none   Labs: Lab Results  Component Value Date   HGBA1C 5.5 06/28/2022    Wt Readings from Last 1 Encounters:  03/24/24 236 lb 12.8 oz (107.4 kg)    BP Readings from Last 1 Encounters:  03/24/24 138/78   Pulse Readings from Last 1 Encounters:  03/24/24 (!) 53       Component Value Date/Time   CHOL 125 03/18/2024 0846   TRIG 146.0 03/18/2024 0846   HDL 19.90 (L) 03/18/2024 0846   CHOLHDL 6 03/18/2024 0846   VLDL 29.2 03/18/2024 0846   LDLCALC 76 03/18/2024 0846   LDLDIRECT 76.0 06/28/2022 0958    Past Medical History:  Diagnosis Date   Athlete's foot    CHF (congestive heart failure) (HCC)    a) NICM b) ECHO (05/2013) EF 10%, diff HK, large mural thrombus, mild MR, LA sev. dilated, RA mildly dilated, mod TR c) Swan GC (06/02/13): CVP 19, PA 41/25 (33), PCWP 29, Thermo CO/CI 2.4 / 1.1 d) cMRI EF 31%, no definite myocardial delayed enhancement    Hypertension    NICM (nonischemic cardiomyopathy) (HCC)    a) LHC (06/04/2013): normal  coronary arteries    Medications Ordered Prior to Encounter[1]  Allergies[2]   Assessment/Plan:  1. Weight loss - Patient has not met goal of at least 5% of body weight loss with comprehensive lifestyle modifications alone in the past 3-6 months. Pharmacotherapy is appropriate to pursue as augmentation. Will start GLP-1RA, will consider Zepbound once sleep study results are obtained. Confirmed patient has no personal or family history of medullary thyroid  carcinoma (MTC) or Multiple Endocrine Neoplasia syndrome type 2 (MEN 2). Injection technique reviewed at today's visit.  Advised patient on common side effects including nausea, diarrhea, dyspepsia, decreased appetite, and fatigue. Counseled patient on reducing meal size and how to titrate medication to minimize side effects.  Counseled patient to call if intolerable side effects or if experiencing dehydration, abdominal pain, or dizziness. Along with pharmacotherapy, the patient will follow dietary modifications and aim for at least 150 minutes of moderate-intensity exercise per week, plus resistance training twice a week (as recommended by the American Heart Association). This resistance training--such as weightlifting, bodyweight exercises, or using resistance bands, adapted to the patients ability--will help prevent muscle loss.  Follow up regarding coverage of GLP-1RA, once sleep study is complete. If therapy is initiated, phone follow-ups will be conducted every 4 weeks for dose titration until the patient reaches the effective therapeutic dose and target weight.  We discussed self pay options as well.  Dinia Joynt E. Arissa Fagin, Pharm.D, CPP [facility]. [facility] [address], [place], [place] [address] Phone: [phone]; Fax: [phone]      [1]  Current Outpatient Medications on File Prior to Visit  Medication Sig Dispense Refill   albuterol  (VENTOLIN  HFA) 108 (90 Base) MCG/ACT inhaler Inhale 2 puffs into the lungs every 6 (six) hours as needed for wheezing or shortness of breath. 6.7 g 0   aspirin  EC 81 MG tablet Take 81 mg by mouth daily.     atorvastatin  (LIPITOR) 20 MG tablet Take 1 tablet (20 mg total) by mouth daily. 90 tablet 1   carvedilol  (COREG ) 25 MG tablet Take 1 tablet (25 mg total) by mouth 2 (two) times daily with a meal. 180 tablet 3   FARXIGA  10 MG TABS tablet Take 1 tablet (10 mg total) by mouth daily before breakfast. 30 tablet 6   fenofibrate  160 MG tablet Take 1 tablet (160 mg total) by mouth daily. 90 tablet 3   flecainide  (TAMBOCOR ) 100 MG tablet Take 1 tablet (100 mg total) by mouth 2 (two) times daily. 60 tablet 6   meloxicam  (MOBIC ) 15 MG tablet Take 1 tablet (15 mg total) by mouth daily for inflammation/pain for 10 days then take as  needed 60 tablet 2   methocarbamol  (ROBAXIN ) 500 MG tablet Take 1 tablet (500 mg total) by mouth every 8 (eight) hours as needed for muscle spasms. 30 tablet 3   sacubitril -valsartan  (ENTRESTO ) 97-103 MG Take 1 tablet by mouth 2 (two) times daily. 60 tablet 3   spironolactone  (ALDACTONE ) 25 MG tablet Take 1 tablet (25 mg total) by mouth daily. 90 tablet 3   No current facility-administered medications on file prior to visit.  [2]  Allergies Allergen Reactions   Mexiletine Hcl Other (See Comments)    Muscle soreness and fatigue

## 2024-09-16 ENCOUNTER — Ambulatory Visit: Admitting: Family Medicine
